# Patient Record
Sex: Male | Born: 1955
Health system: Southern US, Community
[De-identification: ages and names within clinical notes are randomized; demographics above are authoritative.]

## PROBLEM LIST (undated history)

## (undated) DIAGNOSIS — Z87442 Personal history of urinary calculi: Secondary | ICD-10-CM

## (undated) DIAGNOSIS — F17200 Nicotine dependence, unspecified, uncomplicated: Secondary | ICD-10-CM

## (undated) DIAGNOSIS — E785 Hyperlipidemia, unspecified: Secondary | ICD-10-CM

## (undated) DIAGNOSIS — N529 Male erectile dysfunction, unspecified: Secondary | ICD-10-CM

## (undated) DIAGNOSIS — I2510 Atherosclerotic heart disease of native coronary artery without angina pectoris: Secondary | ICD-10-CM

## (undated) DIAGNOSIS — I10 Essential (primary) hypertension: Secondary | ICD-10-CM

## (undated) DIAGNOSIS — R9389 Abnormal findings on diagnostic imaging of other specified body structures: Secondary | ICD-10-CM

## (undated) DIAGNOSIS — I219 Acute myocardial infarction, unspecified: Secondary | ICD-10-CM

## (undated) DIAGNOSIS — N200 Calculus of kidney: Secondary | ICD-10-CM

## (undated) DIAGNOSIS — T7840XA Allergy, unspecified, initial encounter: Secondary | ICD-10-CM

## (undated) DIAGNOSIS — E669 Obesity, unspecified: Secondary | ICD-10-CM

## (undated) HISTORY — DX: Male erectile dysfunction, unspecified: N52.9

## (undated) HISTORY — PX: CORONARY STENT PLACEMENT: SHX1402

## (undated) HISTORY — DX: Hyperlipidemia, unspecified: E78.5

## (undated) HISTORY — DX: Calculus of kidney: N20.0

## (undated) HISTORY — PX: KNEE SURGERY: SHX244

## (undated) HISTORY — DX: Essential (primary) hypertension: I10

## (undated) HISTORY — PX: OTHER SURGICAL HISTORY: SHX169

## (undated) HISTORY — DX: Obesity, unspecified: E66.9

## (undated) HISTORY — DX: Nicotine dependence, unspecified, uncomplicated: F17.200

## (undated) HISTORY — DX: Atherosclerotic heart disease of native coronary artery without angina pectoris: I25.10

## (undated) HISTORY — DX: Allergy, unspecified, initial encounter: T78.40XA

## (undated) HISTORY — PX: COLONOSCOPY: SHX174

---

## 1974-01-05 HISTORY — PX: APPENDECTOMY: SHX54

## 1997-08-12 ENCOUNTER — Emergency Department (HOSPITAL_COMMUNITY): Admission: EM | Admit: 1997-08-12 | Discharge: 1997-08-12 | Payer: Self-pay | Admitting: Emergency Medicine

## 1999-07-02 ENCOUNTER — Encounter: Payer: Self-pay | Admitting: *Deleted

## 1999-07-02 ENCOUNTER — Emergency Department (HOSPITAL_COMMUNITY): Admission: EM | Admit: 1999-07-02 | Discharge: 1999-07-02 | Payer: Self-pay | Admitting: *Deleted

## 1999-07-15 ENCOUNTER — Emergency Department (HOSPITAL_COMMUNITY): Admission: EM | Admit: 1999-07-15 | Discharge: 1999-07-15 | Payer: Self-pay | Admitting: Emergency Medicine

## 1999-08-19 ENCOUNTER — Emergency Department (HOSPITAL_COMMUNITY): Admission: EM | Admit: 1999-08-19 | Discharge: 1999-08-19 | Payer: Self-pay | Admitting: Emergency Medicine

## 1999-08-19 ENCOUNTER — Encounter: Payer: Self-pay | Admitting: Emergency Medicine

## 2002-10-22 ENCOUNTER — Inpatient Hospital Stay (HOSPITAL_COMMUNITY): Admission: EM | Admit: 2002-10-22 | Discharge: 2002-10-24 | Payer: Self-pay | Admitting: Emergency Medicine

## 2003-02-09 ENCOUNTER — Emergency Department (HOSPITAL_COMMUNITY): Admission: EM | Admit: 2003-02-09 | Discharge: 2003-02-09 | Payer: Self-pay | Admitting: Emergency Medicine

## 2003-09-17 ENCOUNTER — Ambulatory Visit: Payer: Self-pay | Admitting: Internal Medicine

## 2003-11-14 ENCOUNTER — Ambulatory Visit: Payer: Self-pay | Admitting: Internal Medicine

## 2003-11-15 ENCOUNTER — Ambulatory Visit: Payer: Self-pay | Admitting: *Deleted

## 2004-01-08 ENCOUNTER — Ambulatory Visit: Payer: Self-pay | Admitting: Cardiology

## 2004-01-21 ENCOUNTER — Ambulatory Visit: Payer: Self-pay

## 2004-01-21 ENCOUNTER — Encounter: Payer: Self-pay | Admitting: Cardiology

## 2004-02-19 ENCOUNTER — Ambulatory Visit: Payer: Self-pay | Admitting: Cardiology

## 2005-02-02 ENCOUNTER — Ambulatory Visit: Payer: Self-pay | Admitting: Pulmonary Disease

## 2005-03-02 ENCOUNTER — Ambulatory Visit: Payer: Self-pay | Admitting: Cardiology

## 2005-06-04 ENCOUNTER — Ambulatory Visit: Payer: Self-pay | Admitting: Cardiology

## 2005-10-06 ENCOUNTER — Ambulatory Visit: Payer: Self-pay | Admitting: Internal Medicine

## 2005-10-07 ENCOUNTER — Inpatient Hospital Stay (HOSPITAL_COMMUNITY): Admission: EM | Admit: 2005-10-07 | Discharge: 2005-10-07 | Payer: Self-pay | Admitting: Emergency Medicine

## 2005-10-12 ENCOUNTER — Ambulatory Visit: Payer: Self-pay | Admitting: Cardiovascular Disease

## 2006-10-12 ENCOUNTER — Ambulatory Visit: Payer: Self-pay | Admitting: Cardiovascular Disease

## 2007-04-11 ENCOUNTER — Ambulatory Visit: Payer: Self-pay | Admitting: Family Medicine

## 2007-05-04 ENCOUNTER — Ambulatory Visit: Payer: Self-pay | Admitting: Family Medicine

## 2007-05-17 ENCOUNTER — Ambulatory Visit: Payer: Self-pay | Admitting: Gastroenterology

## 2007-05-31 ENCOUNTER — Ambulatory Visit: Payer: Self-pay | Admitting: Family Medicine

## 2007-06-27 ENCOUNTER — Telehealth: Payer: Self-pay | Admitting: Gastroenterology

## 2007-06-27 ENCOUNTER — Encounter: Payer: Self-pay | Admitting: Gastroenterology

## 2007-07-25 ENCOUNTER — Encounter: Admission: RE | Admit: 2007-07-25 | Discharge: 2007-09-27 | Payer: Self-pay | Admitting: Family Medicine

## 2007-08-01 ENCOUNTER — Ambulatory Visit: Payer: Self-pay | Admitting: Family Medicine

## 2007-11-29 ENCOUNTER — Ambulatory Visit: Payer: Self-pay | Admitting: Family Medicine

## 2008-04-03 ENCOUNTER — Encounter: Payer: Self-pay | Admitting: Cardiovascular Disease

## 2008-04-03 ENCOUNTER — Ambulatory Visit: Payer: Self-pay | Admitting: Cardiovascular Disease

## 2008-04-03 DIAGNOSIS — E1169 Type 2 diabetes mellitus with other specified complication: Secondary | ICD-10-CM

## 2008-04-03 DIAGNOSIS — E1159 Type 2 diabetes mellitus with other circulatory complications: Secondary | ICD-10-CM | POA: Insufficient documentation

## 2008-04-03 DIAGNOSIS — E785 Hyperlipidemia, unspecified: Secondary | ICD-10-CM | POA: Insufficient documentation

## 2008-04-03 DIAGNOSIS — I10 Essential (primary) hypertension: Secondary | ICD-10-CM

## 2008-04-18 ENCOUNTER — Ambulatory Visit: Payer: Self-pay | Admitting: Cardiovascular Disease

## 2008-04-18 ENCOUNTER — Ambulatory Visit: Payer: Self-pay

## 2008-04-18 LAB — CONVERTED CEMR LAB
ALT: 21 units/L (ref 0–53)
Albumin: 3.6 g/dL (ref 3.5–5.2)
Alkaline Phosphatase: 103 units/L (ref 39–117)
Bilirubin, Direct: 0 mg/dL (ref 0.0–0.3)
CO2: 29 meq/L (ref 19–32)
Calcium: 8.4 mg/dL (ref 8.4–10.5)
Chloride: 104 meq/L (ref 96–112)
Creatinine, Ser: 0.9 mg/dL (ref 0.4–1.5)
Glucose, Bld: 158 mg/dL — ABNORMAL HIGH (ref 70–99)
HDL: 31.8 mg/dL — ABNORMAL LOW (ref >39.00)
Total CHOL/HDL Ratio: 5
Total Protein: 6.3 g/dL (ref 6.0–8.3)
Triglycerides: 131 mg/dL (ref 0.0–149.0)

## 2009-01-14 ENCOUNTER — Ambulatory Visit: Payer: Self-pay | Admitting: Family Medicine

## 2009-03-28 ENCOUNTER — Encounter: Payer: Self-pay | Admitting: Cardiovascular Disease

## 2009-04-05 ENCOUNTER — Encounter: Payer: Self-pay | Admitting: Cardiovascular Disease

## 2009-04-22 ENCOUNTER — Encounter: Payer: Self-pay | Admitting: Cardiovascular Disease

## 2010-01-16 ENCOUNTER — Ambulatory Visit
Admission: RE | Admit: 2010-01-16 | Discharge: 2010-01-16 | Payer: Self-pay | Source: Home / Self Care | Attending: Family Medicine | Admitting: Family Medicine

## 2010-02-04 NOTE — Miscellaneous (Signed)
Summary: refill  Clinical Lists Changes  Medications: Rx of TOPROL XL 25 MG XR24H-TAB (METOPROLOL SUCCINATE) 1 by mouth DAILY;  #90 x 3;  Signed;  Entered by: Julieta Gutting, RN, BSN;  Authorized by: Norva Karvonen, MD;  Method used: Electronically to [facility]*, [address] Shipping/mailing, [place], [place]  [address], Ph: [phone], Fax: [phone]    Prescriptions: TOPROL XL 25 MG XR24H-TAB (METOPROLOL SUCCINATE) 1 by mouth DAILY  #90 x 3   Entered by:   Julieta Gutting, RN, BSN   Authorized by:   Norva Karvonen, MD   Signed by:   Julieta Gutting, RN, BSN on 04/22/2009   Method used:   Electronically to        [facility]* (retail)       [address]       [address] Shipping/mailing       [place], [place]  [address]       Ph: [phone]       Fax: [phone]   RxID:   [id number]

## 2010-02-04 NOTE — Miscellaneous (Signed)
Summary: refill  Clinical Lists Changes  Medications: Changed medication from ENALAPRIL MALEATE 2.5 MG TABS (ENALAPRIL MALEATE) 1 by mouth DAILY to ENALAPRIL MALEATE 2.5 MG TABS (ENALAPRIL MALEATE) 1 by mouth DAILY - Signed Rx of ENALAPRIL MALEATE 2.5 MG TABS (ENALAPRIL MALEATE) 1 by mouth DAILY;  #90 x 3;  Signed;  Entered by: Julieta Gutting, RN, BSN;  Authorized by: Norva Karvonen, MD;  Method used: Electronically to [facility]*, [address], [address] Shipping/mailing, [place], [place]  [address], Ph: [phone], Fax: [phone]    Prescriptions: ENALAPRIL MALEATE 2.5 MG TABS (ENALAPRIL MALEATE) 1 by mouth DAILY  #90 x 3   Entered by:   Julieta Gutting, RN, BSN   Authorized by:   Norva Karvonen, MD   Signed by:   Julieta Gutting, RN, BSN on 03/28/2009   Method used:   Electronically to        [facility]* (retail)       [address]       [address] Shipping/mailing       [place], [place]  [address]       Ph: [phone]       Fax: [phone]   RxID:   [id number]

## 2010-02-04 NOTE — Miscellaneous (Signed)
Summary: Refill  Clinical Lists Changes  Medications: Changed medication from LIPITOR 20 MG TABS (ATORVASTATIN CALCIUM) Take one tablet by mouth daily. to LIPITOR 20 MG TABS (ATORVASTATIN CALCIUM) Take one tablet by mouth daily. - Signed Rx of LIPITOR 20 MG TABS (ATORVASTATIN CALCIUM) Take one tablet by mouth daily.;  #90 x 3;  Signed;  Entered by: Julieta Gutting, RN, BSN;  Authorized by: Norva Karvonen, MD;  Method used: Electronically to [facility]*, [address], [address] Shipping/mailing, [place], [place]  [address], Ph: [phone], Fax: [phone]    Prescriptions: LIPITOR 20 MG TABS (ATORVASTATIN CALCIUM) Take one tablet by mouth daily.  #90 x 3   Entered by:   Julieta Gutting, RN, BSN   Authorized by:   Norva Karvonen, MD   Signed by:   Julieta Gutting, RN, BSN on 04/05/2009   Method used:   Electronically to        [facility]* (retail)       [address]       [address] Shipping/mailing       [place], [place]  [address]       Ph: [phone]       Fax: [phone]   RxID:   [id number]

## 2010-02-14 ENCOUNTER — Other Ambulatory Visit: Payer: Self-pay | Admitting: Gastroenterology

## 2010-02-14 ENCOUNTER — Ambulatory Visit (HOSPITAL_COMMUNITY)
Admission: RE | Admit: 2010-02-14 | Discharge: 2010-02-14 | Disposition: A | Discharge status: Home / Self Care | Payer: 59 | Source: Ambulatory Visit | Attending: Gastroenterology | Admitting: Gastroenterology

## 2010-02-14 DIAGNOSIS — K648 Other hemorrhoids: Secondary | ICD-10-CM | POA: Insufficient documentation

## 2010-02-14 DIAGNOSIS — I2510 Atherosclerotic heart disease of native coronary artery without angina pectoris: Secondary | ICD-10-CM | POA: Insufficient documentation

## 2010-02-14 DIAGNOSIS — E119 Type 2 diabetes mellitus without complications: Secondary | ICD-10-CM | POA: Insufficient documentation

## 2010-02-14 DIAGNOSIS — K644 Residual hemorrhoidal skin tags: Secondary | ICD-10-CM | POA: Insufficient documentation

## 2010-02-14 DIAGNOSIS — D126 Benign neoplasm of colon, unspecified: Secondary | ICD-10-CM | POA: Insufficient documentation

## 2010-02-14 DIAGNOSIS — Z1211 Encounter for screening for malignant neoplasm of colon: Secondary | ICD-10-CM | POA: Insufficient documentation

## 2010-02-14 LAB — GLUCOSE, CAPILLARY: Glucose-Capillary: 131 mg/dL — ABNORMAL HIGH (ref 70–99)

## 2010-04-28 ENCOUNTER — Other Ambulatory Visit: Payer: Self-pay | Admitting: Cardiovascular Disease

## 2010-05-20 ENCOUNTER — Other Ambulatory Visit: Payer: Self-pay | Admitting: Cardiovascular Disease

## 2010-05-20 NOTE — Assessment & Plan Note (Signed)
[facility]                            CARDIOLOGY OFFICE NOTE   VICTORIOUS, KUNDINGER                    MRN:          [id number]  DATE:10/12/2006                            DOB:          [birth year]    Jerome Rodriguez returns for followup at the [facility] Cardiology Office on  October 12, 2006.  He is a very nice 55 year old gentleman with coronary  artery disease.  He initially presented with a non ST elevation MI back  in 2004 and had TAXUS stents placed in the mid and distal LAD.  He has  done well since his initial event and has had no further episodes of  acute coronary syndrome.  He did present in October of 2007 with  atypical chest pain and underwent an exercise treadmill stress test.  He  was able to exercise for approximately 7 minutes, achieving a workload  of 8 METS and he had a negative clinical and electrocardiographic study  at that time.   He denies any chest pain, dyspnea, orthopnea, PND or edema.  He  complains of some blurry vision but this only occurs when reading small  print.  He has not had any postural symptoms.  Jerome Rodriguez continues to  smoke cigars but he is trying to cut back.  He has not been engaged in  any regular exercise.  He continue to work very long hours with his [employer]  and [employer].   CURRENT MEDICATIONS:  1. Enalapril 2.5 mg daily.  2. Zocor 40 mg daily.  3. Aspirin 325 mg daily.  4. Toprol XL 25 mg daily.   ALLERGIES:  NKDA.   PHYSICAL EXAMINATION:  Weight is 255, blood pressure is 100/66 in the  right arm, 100/60 in the left arm, heart rate is 85, respiratory rate is  16.  HEENT:  Normal.  NECK:  Normal carotid upstrokes without bruits, jugular venous pressure  is normal.  LUNGS:  Clear to auscultation bilaterally.  HEART:  Regular rate and rhythm without murmurs or gallops.  ABDOMEN:  Soft, obese, nontender, no organomegaly.  No abdominal bruits.  EXTREMITIES:  No clubbing, cyanosis or edema.   Peripheral pulses are 2+  and equal throughout.   EKG is normal sinus rhythm and is within normal limits.   ASSESSMENT:  1. Coronary artery disease.  He is asymptomatic.  He needs aggressive      secondary risk reduction.  I have strongly encouraged him to      discontinue tobacco as well as initiate an exercise program aimed      at significant weight loss.  With his asymptomatic status he does      not require any further ischemic testing at this point.  He should      remain on aspirin, statin and anti-hypertensive therapy as outlined      below.  2. Dyslipidemia.  He is not tolerating Zocor well.  He has had trouble      with lower back myalgias in the past and he has restarted Zocor but      has developed recurrent pain in his low back.  He has been on      Lipitor in the past and has apparently tolerated that well.  I have      asked him to change from Zocor 40 mg daily to Lipitor 20 mg daily.      A prescription was written and we will check baseline lipids and      liver function tests.  We will plan on repeating lipids and liver      function tests in 12 weeks to see how his clinical response has      been.  3. Hypertension.  His blood pressure is under ideal control at this      point.  Continue with long acting metoprolol as well as low-dose      Enalapril.   For followup I would like to see Jerome Rodriguez on a yearly basis.  We  will follow up with him after his baseline laboratory data has been  completed.  It has been come time since he has had his yearly labs and  we will schedule him for a CBC, complete metabolic panel and lipid panel  to be checked in the next few weeks.     Veverly Fells. Excell Seltzer, MD  Electronically Signed    MDC/MedQ  DD: 10/12/2006  DT: 10/12/2006  Job #: [id number]

## 2010-05-20 NOTE — Telephone Encounter (Signed)
Can you verify this patient?

## 2010-05-20 NOTE — Telephone Encounter (Signed)
[place] pt 

## 2010-05-21 ENCOUNTER — Telehealth: Payer: Self-pay | Admitting: Cardiovascular Disease

## 2010-05-21 MED ORDER — ENALAPRIL MALEATE 2.5 MG PO TABS: 2.5000 mg | ORAL_TABLET | Freq: Every day | ORAL | Status: DC

## 2010-05-21 NOTE — Telephone Encounter (Signed)
Pt called and said phar had faxed for refill 10 days ago and not received yet by phar.  [facility] OP phar. He is out of medication.  Enalapril

## 2010-05-23 NOTE — H&P (Signed)
Jerome Rodriguez, Jerome Rodriguez           ACCOUNT NO.:  [id number]   MEDICAL RECORD NO.:  [id number]          PATIENT TYPE:  INP   LOCATION:  2015                         FACILITY:  [facility]   PHYSICIAN:  Darryl D. Prime, MD    DATE OF BIRTH:  05-25-1955   DATE OF ADMISSION:  10/06/2005  DATE OF DISCHARGE:                                HISTORY & PHYSICAL   CHIEF COMPLAINT:  Chest pain.   CARDIAC ASSESSMENT:  Salvadore Farber, MD   TOTAL VISIT TIME:  Approximately 58 minutes.   HISTORY OF PRESENT ILLNESS:  Jerome Rodriguez is a 55 year old male  with a history of coronary artery disease status post non-ST elevation MI in  October 2004 and had a left heart catheterization, coronary angiogram at the  time showing LAD 99% lesion after D2, right coronary artery was dominant 50%  and sequential lesions, 25% and then 25%.  Left circumflex was okay and left  main was also okay.  Left ventricular ejection fraction 60%.  He was status  post Taxus stent at that time to the LAD, 99% mid lesion to 0%. He has  history of dyslipidemia, history of tobacco abuse who presents with chest  pain.  The patient notes intermittent mild chest pain over the last two  weeks associated with belching and also associated with mild shortness of  breath and nausea.  It was substernal pressure, squeezing sensation with  radiation to the left shoulder and under the left arm to the elbow.  The  radiation was described as a sharp lancing pain.  The patient notes,  however, the night prior to admission October 06, 2005 sudden onset at rest  of 9/10 severe substernal chest pain at 9:15 p.m. with radiation to the arm  described as severe pressure pain sensation substernal associated with  shortness of breath and burping.  The patient took an aspirin 325 mg  approximately 9:30 p.m. October 06, 2005.  He spoke to Dr. Samule Ohm on the  phone and the patient's wife drove him to the emergency room.  The patient  notes on getting to  the emergency room approximately 11:30 or 11:40 p.m. he  was chest pain free.  He notes he was not given anything in the emergency  room.  The patient was chest pain free at time of interview.   PAST MEDICAL HISTORY/PAST SURGICAL HISTORY:  As above.  He is also status  post appendectomy, right shoulder surgery and arthroscopic surgery of the  knee.   ALLERGIES:  NO KNOWN DRUG ALLERGIES.   MEDICATIONS:  1. Enalapril 2.5 mg by mouth daily.  2. Aspirin 325 mg by mouth daily.  3. Metoprolol in the form of Toprol XL 25 mg by mouth daily.  4. Zocor 40 mg by mouth at bedtime daily.  5. The patient took Plavix for six months post-stent.   The patient notes he had a stress test done in February 2007 which was  unremarkable. This was an exercise stress test.   SOCIAL HISTORY:  Rare alcohol.  He did smoke for 25 years 1 pack a day,  recently discontinued six months  ago, he is now smoking cigars occasionally.  Denies any illicit drug use.   FAMILY HISTORY:  He notes his sister has a possible hole in the heart.   REVIEW OF SYSTEMS:  CONSTITUTIONALLY:  He denies any fevers or weight  changes.  He does note significant fatigue recently that is new especially  over the last three days.  EYES:  He denies any decreased visual acuity or  drainage from the eye.  EAR, NOSE MOUTH AND THROAT:  No throat swelling,  throat pain.  CARDIOVASCULAR:  As noted above.  RESPIRATORY:  He denies any  wheezing or cough.  GASTROINTESTINAL:  He notes nausea, no vomiting.  GENITOURINARY:  He denies any dysuria, hematuria, musculoskeletal.  No  effusions or joint pain.  INTEGUMENTARY:  He denies any nail changes or  rashes.  NEUROLOGIC:  He denies any focal weakness or seizure history.  PSYCHIATRIC:  He denies any depression, suicidal ideation.  ENDOCRINE:  He  denies any polydipsia, polyuria. HEMATOLOGIC/LYMPHATIC:  He denies easy  bruising or bleeding. ALLERGIES/IMMUNOLOGIC:  He denies any allergies to  aspirin or  any allergic rhinitis.   PHYSICAL EXAMINATION:  VITAL SIGNS:  Temperature 97.6, blood pressure  108/69, respiratory rate 14, heart rate 90, oxygen saturations 92% on room  air.  GENERAL:  He is a well-developed male in no acute distress.  EYES:  Pupils equal, round and reactive to light.  Conjunctivae are not  pale.  EARS, NOSE, MOUTH AND THROAT:  Overall appearance of the ears and nose shows  no scars or lesions.  Inspection of the nasal mucosa or septum, turbinates,  inspection of the teeth, gums lips, salivary glands, hard and soft palate  reveals no lesions.  NECK:  Symmetric, trachea midline, no crepitus.  RESPIRATORY:  Clear to auscultation bilaterally anteriorly and posteriorly.  CARDIOVASCULAR:  No displacement of his point of maximal impulse.  No right  or left ventricular heaves.  Normal S1, S2.  Regular rate and rhythm, no S3  or S4.  No increased jugular venous distention, no carotid bruits. Dorsalis  pedis and posterior tibial and radial arteries are 2+ and symmetric.  GASTROINTESTINAL:  The abdomen is soft, nontender, nondistended with no  hepatosplenomegaly, normal active bowel sounds.  LYMPHATIC:  Exam of the neck, axilla and groin reveals no lesions or  lymphadenopathy.  MUSCULOSKELETAL:  No joint effusions, he moves all extremities well.  SKIN:  Exam shows no rashes or ulcers.  NEUROLOGIC:  Cranial nerves 2 through 12 are grossly intact.  Alert and  oriented x 4.  Strength and sensation is full.   LABORATORY DATA:  Sodium 140, potassium 3.6, chloride 106, bicarb 26, BUN  16, creatinine 1.1, glucose 175.  Total bilirubin 0.7, alkaline phosphatase  106, AST 22, ALT 21, total protein 5.9, albumin 3.4.  White count 7300,  hemoglobin 14.4, hematocrit 42.1, platelet count 253,000, segs 56,  lymphocytes 32, calcium 8.7.  Cardiac markers performed 2358 showed a CK MB 1.3, troponin less than 0.05, myoglobin 73.3. Similar markers results were  seen.  He also had cardiac  markers drawn 2305 with similar results.  PTT 29,  PT 14.5, INR 1.1, D-dimer 0.25.  EKG October 06, 2005 shows a normal sinus  rhythm at 92, normal axis, normal intervals, EKG done October 24, 2002  showed normal sinus rhythm, 72, with normal axis, normal intervals.   ASSESSMENT AND PLAN:  This is a gentleman with history of coronary artery  disease now with chest pain rule out  PTE.  He likely has unstable angina.  Will admit to telemetry.  Patient will be placed on q.6 metoprolol 25 mg,  oxygen.  He will be held n.p.o.  Lovenox will be give 1 mg per kg every 12  hours.  Continue statin.  Will increase __________  to 80 mg of Zocor daily.  We will check a lipid panel.  Protonix will be given for GI prophylaxis.  I  will give potassium for his hypokalemia.  I will guaiac stools to rule out  any source of bleeding.  Will get a chest x-ray to rule out any pulmonary  edema or interval cardiomegaly.  He will be given oxygen.  Oxygen  saturations are on the lower side.  Will continue Enalapril.  He will be  held n.p.o. pending further evaluation by Dr. Ladona Ridgel.      Darryl D. Prime, MD  Electronically Signed     DDP/MEDQ  D:  10/07/2005  T:  10/08/2005  Job:  [id number]

## 2010-05-23 NOTE — Discharge Summary (Signed)
Jerome Rodriguez, Jerome Rodriguez           ACCOUNT NO.:  [id number]   MEDICAL RECORD NO.:  [id number]          PATIENT TYPE:  INP   LOCATION:  2015                         FACILITY:  [facility]   PHYSICIAN:  Salvadore Farber, MD  DATE OF BIRTH:  [birth year]   DATE OF ADMISSION:  10/06/2005  DATE OF DISCHARGE:  10/07/2005                                 DISCHARGE SUMMARY   PRINCIPAL DIAGNOSIS:  Chest pain.   SECONDARY DIAGNOSES:  1. Coronary artery disease status post left anterior descending stenting      with Taxus drug-eluting stent in October 2004.  2. Hyperlipidemia.  3. Tobacco abuse.  4. History of right shoulder surgery.  5. Status post appendectomy.  6. History of arthroscopic knee surgery.   ALLERGIES:  No known drug allergies.   HISTORY OF PRESENT ILLNESS:  A 55 year old male with prior history of CAD  status post non-ST-elevation MI in October 2004 with catheterization  revealing 99% stenosis in the LAD which was successfully treated with a  Taxus drug-eluting stent.  He most recently had a negative stress test in  February 2007.  He was in his usual state of health until approximately 2  weeks ago when he began to experience intermittent chest discomfort  associated with belching, nausea and mild shortness of breath.  Discomfort  was substernal in nature, but sometimes radiates to the left shoulder and  left arm.  On October 06, 2005, he had more severe, 9/10, discomfort in his  chest associated with belching, and his wife took him into the ED for  additional evaluation.  His cardiac markers were normal.  EKG was without  any acute changes and he was admitted for additional rule out.   HOSPITAL COURSE:  The patient ruled out for MI by cardiac markers x3.  His  ECG has remained stable and he has not any additional chest pain.  His  symptoms are suspiciously GERD-like and for that reason we have added PTI  therapy.  We counseled him on the importance of smoking cessation and  also  have provided him with prescription for Chantex.  We will discharge him  today in satisfactory condition with the plan for him to undergo an exercise  treadmill test on Monday October 8 at 10:45 a.m.  Secondary to concern for  sleep apnea, we will also arrange for pulmonary evaluation by Dr. Maple Hudson of  [facility] pulmonology on October 19, 2005, at 3:20 p.m..  The patient likely  will require a sleep study.   DISCHARGE LABORATORY DATA:  Hemoglobin 14.3, hematocrit 41.2, WBC 6.5,  platelets 223,000.  Sodium 140, potassium 3.6, chloride 106, CO2 26, BUN 16,  creatinine 1.1, glucose 175, total bilirubin 0.7, alkaline phosphatase 106,  AST 22, ALT 21, total protein 5.9, albumin 3.4, calcium 8.7, CK 151, MB 1.7,  troponin-I of 1.1, total cholesterol 97, triglycerides 214, HDL 43, LDL 121.   DISPOSITION:  The patient is being discharged home today in good condition.   FOLLOW-UP APPOINTMENTS:  He will undergo exercise treadmill testing at  [facility] on October 12, 2005, at 9:45 a.m. He will then follow  up  with Dr. Maple Hudson at [facility] on October 19, 2005 at 3:20 p.m.  He  finally has a followup appointment with Dr. Randa Evens on November 11, 2005, at 2:53.   DISCHARGE MEDICATIONS:  1. Aspirin 325 mg every day.  2. Toprol XL 25 mg every day.  3. Enalapril 2. 5 mg every day.  4. Simvastatin 40 mg every day.  5. Chantex as prescribed.  6. Nitroglycerin 0.4 mg sublingual p.r.n. chest pain.  7. Protonix 4 mg every day.   DURATION OF DISCHARGE ENCOUNTER:  45 minutes including dictation time.     ______________________________  Nicolasa Ducking, ANP      Salvadore Farber, MD  Electronically Signed    CB/MEDQ  D:  10/07/2005  T:  10/08/2005  Job:  [id number]   cc:   Joni Fears D. Maple Hudson, MD, FCCP, FACP

## 2010-05-23 NOTE — Cardiovascular Report (Signed)
NAME:  AL FOYE, DAMRON                    ACCOUNT NO.:  [id number]   MEDICAL RECORD NO.:  [id number]                   PATIENT TYPE:  INP   LOCATION:  2908                                 FACILITY:  [facility]   PHYSICIAN:  Veneda Melter, M.D.                   DATE OF BIRTH:  September 05, 1955   DATE OF PROCEDURE:  10/23/2002  DATE OF DISCHARGE:                              CARDIAC CATHETERIZATION   PROCEDURES PERFORMED:  1. PTCA and stent placement mid left anterior descending.  2. PTCA and stent placement distal left anterior descending.  3. PTCA and stent placement of the second diagonal branch of the left     anterior descending.  4. Perclose right femoral artery.   DIAGNOSES:  1. Severe single vessel coronary artery disease.  2. Unstable angina.   HISTORY:  Mr. Roderic Palau is a 55 year old male who presents with unstable  angina.  The patient was admitted to the hospital, stabilized medically, and  underwent cardiac catheterization by Rollene Rotunda, M.D. showing severe  single vessel coronary artery disease involving the LAD with well preserved  LV function.  He is referred for percutaneous intervention.   TECHNIQUE:  Informed consent was obtained.  An existing 6-French sheath in  the right groin was exchanged for a 7-French sheath.  The patient was then  given a total of 600 mg Plavix orally as well as heparin and Integrilin on a  weight adjusted basis to maintain ACT approximately 300 seconds.  A 7-French  Voda left 4 guide catheter was used to engage the left coronary artery.  A  0.014 inch Luge wire advanced in the distal LAD.  A 3.0 x 12 mm Voyager  balloon was then introduced and used to pre dilate the mid LAD lesion at 8  atmospheres for 45 seconds.  Repeat angiography showed recannulization of  the vessel with significant increase in lumen diameter and distal vessel  flow from TIMI grade 2-3.  A 3.5 x 16 mm Taxus Express-2 stent was  introduced, carefully positioned in the  mid LAD just distal to the first  diagonal branch and straddling the second diagonal branch and deployed at 12  atmospheres for 60 seconds.  A 3.5 x 12 mm Quantum Maverick balloon was then  used to post dilate the stent.  Two inflations were performed at 16  atmospheres for 30 seconds and a 4.0 x 8 mm Quantum Maverick balloon was  used to further dilate the proximal segment of the stent at 16 atmospheres  for 30 seconds.  Repeat angiography showed an excellent result with no  residual stenosis, full coverage of the lesion, and TIMI 3 flow in the LAD.  There was, unfortunately, mild compromise of the second diagonal branch at  its origin with plaque shift resulting in 70% narrowing.  There was also  further narrowing of at least 70% in the distal LAD that was under  appreciated prior  to intervention of the mid LAD due to incomplete distal  flow.  A 3.0 x 12 mm Taxus Express-2 stent was then introduced, positioned  at the distal LAD at the severe narrowing of 70% and deployed at 10  atmospheres for 30 seconds.  A 3.0 x 8 mm Quantum Maverick balloon was then  used to post dilate the distal and proximal segments of the stent.  Two  inflations were performed at 16 atmospheres for 30 seconds and a single  inflation in the mid section at 18 atmospheres for 60 seconds.  Repeat  angiography showed an excellent result with less than 10% residual narrowing  in the mid section in the area of severe stenosis but there was full  coverage of the lesion effectively improved vessel diameter.  The Luge wire  was then repositioned in the second diagonal branch and a 2.5 x 9 mm  Maverick balloon used to dilate the ostium of the diagonal branch for  treatment of plaque shift and stent jail at 6 atmospheres for 30 seconds.  Repeat angiography was then performed showing excellent result with no  residual stenosis and improved flow through the second diagonal branch.  There was no evidence of compromise of the  native LAD.  Final angiography  was performed in various projections showing an excellent result with no  residual stenosis, no vessel damage, and TIMI 3 flow through the LAD.  The  guide catheter was then removed as was the sheath and a Perclose suture  closure device deployed to the right femoral artery.  Adequate hemostasis  was achieved.  The patient transferred to the holding area in stable  condition.  He tolerated procedure well.   FINAL RESULT:  1. Successful PTCA and stent placement to the mid left anterior descending     with reduction of 99% narrowing to 0% with placement of a 3.5 x 16 mm     Taxus Express-2 stent.  2. Successful PTCA and stent placement distal left anterior descending with     reduction of 70% narrowing to less than 10% with placement of a 3.0 x 12     mm Taxus Express-2 stent.                                               Veneda Melter, M.D.    Melton Alar  D:  10/23/2002  T:  10/24/2002  Job:  [id number]   cc:   Carole Binning, M.D. [facility]   Rollene Rotunda, M.D.

## 2010-05-23 NOTE — Procedures (Signed)
[facility]                                EXERCISE ISIAHA, GREENUP                    MRN:          [id number]  DATE:10/12/2005                            DOB:          1955-05-02    CARDIOLOGIST:  Dr. Randa Evens   HISTORY:  [name] is a very pleasant 54 year old male patient followed  by Dr. Samule Ohm with a history of non-ST-elevation myocardial infarction  October 2004 treated with a Taxus drug-eluting stent to the LAD.  He  recently presented to the emergency room at [facility] with  complaints of chest discomfort associated with belching, nausea and mild  shortness of breath.  He ruled out for myocardial infarction and was set up  for an outpatient exercise treadmill test today.   EXERCISE TREADMILL TEST:  The patient exercised for 6 minutes 47 seconds,  achieving a work level of 8.2 METs.  His resting heart rate rose from 91  beats per minute to a maximum of 150 beats per minute.  This value  represented 88% of his maximal age-predicted heart rate.  His resting blood  pressure went from 108/60 to a maximum of 174/70.  The test was stopped  secondary to fatigue and dyspnea.  He denied any chest pain.   Electrocardiogram at baseline revealed sinus rhythm with a heart rate of 96,  normal axis.  During exercise he had no ST-T wave changes to suggest  ischemia or injury.  At maximal exercise he did have upsloping ST depression  in leads V3 through V6.  There were nonspecific changes inferiorly.   IMPRESSION:  Clinically negative and electrically negative exercise  treadmill test.   RECOMMENDATIONS:  The patient is to follow up with Dr. Samule Ohm.  The strips  from his exercise treadmill test will be left for further review by Dr.  Samule Ohm.      ______________________________  Tereso Newcomer, PA-C    ______________________________  Veverly Fells. Excell Seltzer, MD     SW/MedQ  DD:  10/12/2005  DT:  10/12/2005  Job #:  [id number]

## 2010-05-27 NOTE — H&P (Signed)
NAME:  Jerome Rodriguez, Jerome Rodriguez                    ACCOUNT NO.:  [id number]   MEDICAL RECORD NO.:  [id number]                   PATIENT TYPE:  EMS   LOCATION:  [facility]                                 FACILITY:  [facility]   PHYSICIAN:  Anna Genre. Maisie Fus, M.D. [facility]           DATE OF BIRTH:  1955/01/12   DATE OF ADMISSION:  10/22/2002  DATE OF DISCHARGE:                                HISTORY & PHYSICAL   The patient has no assigned cardiologist or primary care Tinleigh Whitmire.   CHIEF COMPLAINT:  Chest pain/angina.   HISTORY OF PRESENT ILLNESS:  This is a 55 year old gentleman who presents  with unstable angina has had chest pain times approximately 10 days with  many typical features.  Over this course in time the patient has noted  increasing frequency of the chest pain.  Risk factors include positive  family history and tobacco use.  The patient denies any syncope, presyncope,  orthopnea or paroxysmal nocturnal dyspnea.  Has not had any prior cardiac  work up.  Lipid status at this point is unknown.   PAST MEDICAL HISTORY:  The patient has not had any prior catheterizations or  prior bypass surgery.  There is no assessment of his left ventricular  function.  The patient reports never being hospitalized and no prior history  of significant medical illness.   MEDICATIONS:  Aspirin which he started taking approximately three weeks ago,  however, discontinued it about 10 days ago secondary to what he thought was  constipation induced by the aspirin.   SOCIAL HISTORY:  The patient lives in [place] alone, is single, works at  [employer] currently.  The patient does report being divorced, has two  children, one 22 and one 46.  The patient has a 35 pack year history of  tobacco, now currently smokes cigars after stopping cigarettes in June of  this year.  Alcohol - the patient reports two beers per day.  No herbal use  or illicit drug use.  The patient exercises occasionally.   FAMILY HISTORY:   Significant for questionable cardiomyopathy in his mother.  A sister had four-vessel bypass surgery at the age of 9 in [place].   REVIEW OF SYSTEMS:  CONSTITUTIONAL:  Significant for fatigue, otherwise no  other constitutional symptoms.  HEENT, skin review of systems are within  normal limits.  CARDIOPULMONARY:  Please see  HPI.  GU/GI:  No significant abnormalities.  NEURO/PSYCHE:  Was not  assessed.  MUSCULOSKELETAL:  Negative.  All other review of systems were  negative.  The patient is a full code.   PHYSICAL EXAMINATION:  VITAL SIGNS:  Temperature is afebrile, pulse is 79,  respiratory rate 12, blood pressure 89/60.  Saturating 98% on room air.  GENERAL:  This is a well appearing, comfortable middle [place] male in no  apparent distress.  HEENT:  Normocephalic, atraumatic.  Extraocular muscles are intact.  Sclerae  were anicteric.  NECK:  No lymphadenopathy, there were  no bruits and JVP was not elevated.  CARDIOVASCULAR:  Regular rate and rhythm, normal S1, S2.  No appreciable  gallops, S3 or S4.  Pulses were 2+ and symmetric bilaterally in the carotid  distribution.  LUNGS:  Clear to auscultation bilaterally.  SKIN:  Within normal limits.  ABDOMEN:  Soft, nontender, nondistended with normal active bowel sounds.  CHEST WALL:  The patient did have chest wall tenderness in the left upper  sternal border.  GU EXAM:  Within normal limits.  RECTAL:  Exam was not performed.  EXTREMITIES:  No clubbing, cyanosis or edema.  MUSCULOSKELETAL:  Exam showed no joint deformities.  NEUROLOGIC:  He was alert and oriented x3.  Cranial nerves II-XII grossly  intact, 5 out of 5 strength throughout.  Reflexes were not tested.   EKG showed a rate of 80, normal sinus rhythm, axis was normal and intervals  were within normal limits, no Q or ST changes and no hypertrophy.   NOTABLE LABS:  Hematocrit 42, platelet count 257,000.  Potassium 4.3, bicarb  30, glucose 122.  GI panel within normal limits.   CK-MB 231.  The patient's  cardiac markers are still pending.  CK is back at 231 currently.  Will await  the remainder of the enzymes to effectively rule the patient out or in.   IMPRESSION AND PLAN:  A patient with a good story for unstable angina  increasing over the past 10 days with mild to moderate risk factors.  Given  the patient's convincing story I feel that it is reasonable to proceed with  cardiac catheterization for the patient for definitive risk stratification.  For now will treat patient with enoxaparin, hold on clopidogrel or IIb-IIIa  until further information is obtained about patient's cardiac markers.  There are no EKG changes.  The patient also will be treated with a small  dose of beta blocker and ACE inhibitor for its benefits in patients with  cardiac disease.  Secondary prevention as well as possible primary  prevention based on what the patient's anatomy will be of utmost importance.  The patient will be started on atorvastatin and lipids will be assessed.                                                Anna Genre Maisie Fus, M.D. [facility]    KLT/MEDQ  D:  10/22/2002  T:  10/22/2002  Job:  [id number]

## 2010-05-27 NOTE — Discharge Summary (Signed)
   NAME:  Jerome Rodriguez, Jerome Rodriguez                    ACCOUNT NO.:  [id number]   MEDICAL RECORD NO.:  [id number]                   PATIENT TYPE:  INP   LOCATION:  2908                                 FACILITY:  [facility]   PHYSICIAN:  Carole Binning, M.D. [facility]         DATE OF BIRTH:  [birth year]   DATE OF ADMISSION:  10/22/2002  DATE OF DISCHARGE:                           DISCHARGE SUMMARY - REFERRING   DISCHARGE DIAGNOSES:  1. Non-Q-wave myocardial infarction.  2. Hyperlipidemia.  3. Tobacco abuse.   HOSPITAL COURSE:  [name] is a 55 year old male patient who presented  to [facility] with unstable angina.  He actually ruled in for an non-ST-  elevated myocardial infarction with elevated troponins.  Ultimately, he did  require cardiac catheterization which revealed an LAD with a mid 99% lesion  after the D-2, RCA dominant with a 50% followed by a proximal 25% and distal  25% lesion, circumflex was large with no angiographic evidence of coronary  artery disease, as well as the left main.  Left ventriculogram was normal  with an EF of 60%.  At this point Dr. Veneda Melter performed an  angioplasty/Taxus stent placement to the 99% mid LAD lesion, reducing it to  a 0% post procedure.  At this point the patient will need to remain on  Plavix for greater than or equal to six months.   The patient's laboratory studies on discharge include a negative cardiac  isoenzymes.  Sodium 139, potassium 3.8, BUN 11, creatinine 1.1.  Hemoglobin  14.1, hematocrit 41.3, platelets 230.  He is being discharged to home in  stable condition on the following medications:  1. Lipitor 80 mg one p.o. q.h.s.  2. Enteric-coated aspirin 325 mg a day.  3. Plavix 75 mg a day.  4. Sublingual nitroglycerin p.r.n. pain.  5. Vasotec 5 mg a day.  6. Metoprolol 50 mg one-half tablet daily.   No strenuous activity.  If he has any pain in his groin site he is to take  Tylenol.  He is not to smoke.  No driving or  strenuous activity for two  days.  Gradually increase activity but do not return to work until November 06, 2002.  He has a follow-up appointment with Dr. Gerri Spore on November 02, 2002 at 2 p.m. and call for any questions or concerns.  Clean over  catheterization site with soap and water and no smoking.      Guy Franco, P.A. [facility]                      Carole Binning, M.D. [facility]    LB/MEDQ  D:  10/24/2002  T:  10/24/2002  Job:  [id number]

## 2010-05-27 NOTE — Cardiovascular Report (Signed)
   NAME:  Jerome Rodriguez, Jerome Rodriguez.                   ACCOUNT NO.:  [id number]   MEDICAL RECORD NO.:  [id number]                   PATIENT TYPE:  INP   LOCATION:                                       FACILITY:  [facility]   PHYSICIAN:  Rollene Rotunda, M.D.                DATE OF BIRTH:  Dec 04, 1955   DATE OF PROCEDURE:  DATE OF DISCHARGE:                              CARDIAC CATHETERIZATION   PRIMARY CARE PHYSICIAN:  None.   PROCEDURES PERFORMED:  1. Left heart catheterization.  2. Coronary arteriography.   CARDIOLOGIST:  Rollene Rotunda, M.D.   INDICATIONS:  Evaluate patient with non-Q wave myocardial infarction and  unstable angina.   PROCEDURAL NOTE:  Left heart catheterization was performed via the right  femoral artery.  The artery was cannulated using an anterior wall puncture.  A number 6 family history arterial sheath was inserted via the modified  Seldinger technique.  Preformed Judkins and a pigtail catheter were  utilized.   The patient tolerated the procedure well and left the lab in stable  condition.   RESULTS:   HEMODYNAMIC DATA:  LV 103/20.  Aortic output 103/84.   ANGIOGRAPHIC DATA:  Coronaries:  The left main was normal.   The LAD had diffuse luminal irregularities.  There was mid 99% stenosis  after the second diagonal.  The first diagonal was a large vessel and  normal.  The second diagonal was small and normal.  The third diagonal was  small and normal.   The circumflex was large with diffuse luminal irregularities.  There was a  large mid obtuse marginal, which was normal.  There was a very large  posterolateral, which had luminal irregularities.   The right coronary artery was a dominant vessel.  There was a shelf-like 50%  stenosis.  There was a long proximal 25% stenosis and a long distal 25%  stenosis.   VENTRICULOGRAPHIC DATA:  Left Ventriculogram:  The left ventriculogram was  obtained in the RAO projection with an EF of 65%.    CONCLUSION:  1.  High-grade left anterior descending stenosis.  2. Moderate right coronary artery obstruction and diffuse irregularities     elsewhere.   PLAN:  The patient will have percutaneous revascularization of the LAD and  aggressive secondary risk reduction.                                                 Rollene Rotunda, M.D.    JH/MEDQ  D:  10/23/2002  T:  10/24/2002  Job:  [id number]

## 2010-06-24 ENCOUNTER — Encounter: Payer: Self-pay | Admitting: Family Medicine

## 2010-06-24 DIAGNOSIS — N529 Male erectile dysfunction, unspecified: Secondary | ICD-10-CM

## 2010-06-24 DIAGNOSIS — E118 Type 2 diabetes mellitus with unspecified complications: Secondary | ICD-10-CM | POA: Insufficient documentation

## 2010-06-24 DIAGNOSIS — F17200 Nicotine dependence, unspecified, uncomplicated: Secondary | ICD-10-CM

## 2010-06-24 DIAGNOSIS — I2510 Atherosclerotic heart disease of native coronary artery without angina pectoris: Secondary | ICD-10-CM | POA: Insufficient documentation

## 2010-06-26 ENCOUNTER — Encounter: Payer: Self-pay | Admitting: Cardiovascular Disease

## 2010-07-15 ENCOUNTER — Encounter: Payer: Self-pay | Admitting: Family Medicine

## 2010-07-17 ENCOUNTER — Encounter: Payer: Self-pay | Admitting: Family Medicine

## 2010-07-17 ENCOUNTER — Ambulatory Visit: Payer: Self-pay | Admitting: Family Medicine

## 2010-07-17 ENCOUNTER — Ambulatory Visit (INDEPENDENT_AMBULATORY_CARE_PROVIDER_SITE_OTHER): Payer: 59 | Admitting: Family Medicine

## 2010-07-17 DIAGNOSIS — Z79899 Other long term (current) drug therapy: Secondary | ICD-10-CM

## 2010-07-17 DIAGNOSIS — E785 Hyperlipidemia, unspecified: Secondary | ICD-10-CM

## 2010-07-17 DIAGNOSIS — E1169 Type 2 diabetes mellitus with other specified complication: Secondary | ICD-10-CM

## 2010-07-17 DIAGNOSIS — E119 Type 2 diabetes mellitus without complications: Secondary | ICD-10-CM

## 2010-07-17 DIAGNOSIS — E669 Obesity, unspecified: Secondary | ICD-10-CM

## 2010-07-17 DIAGNOSIS — I152 Hypertension secondary to endocrine disorders: Secondary | ICD-10-CM

## 2010-07-17 DIAGNOSIS — E1159 Type 2 diabetes mellitus with other circulatory complications: Secondary | ICD-10-CM

## 2010-07-17 DIAGNOSIS — I10 Essential (primary) hypertension: Secondary | ICD-10-CM

## 2010-07-17 DIAGNOSIS — I2510 Atherosclerotic heart disease of native coronary artery without angina pectoris: Secondary | ICD-10-CM

## 2010-07-17 LAB — POCT GLYCOSYLATED HEMOGLOBIN (HGB A1C): Hemoglobin A1C: 6.5

## 2010-07-17 LAB — HEPATIC FUNCTION PANEL
ALT: 16 U/L (ref 0–53)
AST: 15 U/L (ref 0–37)
Bilirubin, Direct: 0.1 mg/dL (ref 0.0–0.3)
Indirect Bilirubin: 0.5 mg/dL (ref 0.0–0.9)
Total Protein: 6.5 g/dL (ref 6.0–8.3)

## 2010-07-17 LAB — LIPID PANEL
Cholesterol: 139 mg/dL (ref 0–200)
Triglycerides: 162 mg/dL — ABNORMAL HIGH (ref <150)

## 2010-07-17 NOTE — Progress Notes (Signed)
  Subjective:    Patient ID: Jerome Rodriguez, male    DOB: 02-22-1955, 55 y.o.   MRN: [id number]  HPI He is here for a recheck. He continues on medications listed in the chart. He is not exercising as much as he should and recognizes this; still smokes cigars and rarely drinks. He has had an eye exam. He does check his feet periodically. Does check his blood sugars periodically and they run in the low 100 range. He has had no chest pain, shortness of breath or DOE. He had a stent placed in 2003.  Review of Systems     Objective:   Physical Exam And alert and in no distress. Hemoglobin A1c is 6.5.       Assessment & Plan:  Diabetes. ASHD. Obesity. Hypertension. Hyperlipidemia. I will check lipid panel on him today. Encouraged him to do a better job of taking care of himself especially diet and exercise.

## 2010-07-17 NOTE — Patient Instructions (Signed)
Keep working on [employer] and exercise. Don't forget to check your blood sugars

## 2010-07-18 ENCOUNTER — Telehealth: Payer: Self-pay

## 2010-07-18 NOTE — Telephone Encounter (Signed)
Called and informed pt labs ok and mailed a copy

## 2010-08-14 ENCOUNTER — Other Ambulatory Visit: Payer: Self-pay | Admitting: *Deleted

## 2010-08-14 MED ORDER — ENALAPRIL MALEATE 2.5 MG PO TABS: 2.5000 mg | ORAL_TABLET | Freq: Every day | ORAL | Status: DC

## 2010-09-11 ENCOUNTER — Ambulatory Visit (INDEPENDENT_AMBULATORY_CARE_PROVIDER_SITE_OTHER): Payer: 59 | Admitting: Cardiovascular Disease

## 2010-09-11 ENCOUNTER — Ambulatory Visit: Payer: 59 | Admitting: Cardiovascular Disease

## 2010-09-11 ENCOUNTER — Encounter: Payer: Self-pay | Admitting: Cardiovascular Disease

## 2010-09-11 VITALS — BP 104/68 | HR 86 | Ht 69.0 in | Wt 248.8 lb

## 2010-09-11 DIAGNOSIS — E785 Hyperlipidemia, unspecified: Secondary | ICD-10-CM

## 2010-09-11 DIAGNOSIS — I2510 Atherosclerotic heart disease of native coronary artery without angina pectoris: Secondary | ICD-10-CM

## 2010-09-11 DIAGNOSIS — I10 Essential (primary) hypertension: Secondary | ICD-10-CM

## 2010-09-11 NOTE — Patient Instructions (Signed)
Your physician has requested that you have an exercise tolerance test. For further information please visit [email or web address] Please also follow instruction sheet, as given. To be done with Dr. Excell Seltzer

## 2010-09-15 ENCOUNTER — Encounter: Payer: Self-pay | Admitting: Cardiovascular Disease

## 2010-09-15 NOTE — Assessment & Plan Note (Signed)
Recent lipids reviewed with LDL at goal in the 70s. Continue statin therapy.

## 2010-09-15 NOTE — Assessment & Plan Note (Signed)
The patient's cardiac catheter report from 2004 was reviewed. He had extensive single vessel CAD involving the left anterior descending. His only symptom is exertional dyspnea, and I have recommended a Myoview stress study to rule out significant ischemia encased dyspnea is his anginal equivalent. He will continue his current medical program as outlined.

## 2010-09-15 NOTE — Progress Notes (Signed)
HPI:  This is a 55 year old gentleman presented evaluation. The patient has coronary artery disease and underwent stenting of the LAD using Taxus drug-eluting stent platforms in 2004. He presented at that time with non-ST elevation infarction.  The patient currently complains of generalized fatigue and exertional dyspnea. He denies chest pain or pressure. He denies palpitations, orthopnea, or PND. He has not been engaged in any regular exercise. He is smoking cigars on a regular basis.  Outpatient Encounter Prescriptions as of 09/11/2010  Medication Sig Dispense Refill  . aspirin 325 MG EC tablet Take 325 mg by mouth daily.        Marland Kitchen atorvastatin (LIPITOR) 20 MG tablet TAKE ONE TABLET BY MOUTH DAILY.  90 tablet  3  . enalapril (VASOTEC) 2.5 MG tablet Take 1 tablet (2.5 mg total) by mouth daily.  90 tablet  0  . metFORMIN (GLUCOPHAGE-XR) 750 MG 24 hr tablet Take 750 mg by mouth 2 (two) times daily at 10 AM and 5 PM.        . metoprolol succinate (TOPROL-XL) 25 MG 24 hr tablet TAKE 1 TABLET BY MOUTH ONCE DAILY  90 tablet  3  . Multiple Vitamin (MULTIVITAMIN) tablet Take 1 tablet by mouth daily.        Marland Kitchen DISCONTD: ergocalciferol (VITAMIN D2) 50000 UNITS capsule Take 50,000 Units by mouth once a week.        Marland Kitchen DISCONTD: tadalafil (CIALIS) 10 MG tablet Take 10 mg by mouth daily as needed. 30 min prior to sexual activity         No Known Allergies  Past Medical History  Diagnosis Date  . CAD (coronary artery disease)   . Dyslipidemia   . HTN (hypertension)   . Obesity   . Diabetes mellitus   . ASHD (arteriosclerotic heart disease)     STENT  . ED (erectile dysfunction)   . Smoker     CIGARS    ROS: Negative except as per HPI  BP 104/68  Pulse 86  Ht 5\' 9"  (1.753 m)  Wt 248 lb 12.8 oz (112.855 kg)  BMI 36.74 kg/m2  PHYSICAL EXAM: Pt is alert and oriented, obese male in NAD HEENT: normal Neck: JVP - normal, carotids 2+= without bruits Lungs: CTA bilaterally CV: RRR without murmur or  gallop Abd: soft, obese, NT, Positive BS Ext: no C/C/E, distal pulses intact and equal Skin: warm/dry no rash  EKG:  Normal sinus rhythm 86 beats per minute, right ventricular conduction delay, otherwise within normal limits.  ASSESSMENT AND PLAN:

## 2010-09-15 NOTE — Assessment & Plan Note (Signed)
Blood pressure is well controlled on a combination of enalapril and metoprolol.

## 2010-09-18 ENCOUNTER — Telehealth: Payer: Self-pay | Admitting: Family Medicine

## 2010-09-18 ENCOUNTER — Other Ambulatory Visit: Payer: Self-pay | Admitting: Family Medicine

## 2010-09-18 MED ORDER — METFORMIN HCL ER 750 MG PO TB24: 750.0000 mg | ORAL_TABLET | Freq: Two times a day (BID) | ORAL | Status: DC

## 2010-09-18 NOTE — Telephone Encounter (Signed)
Metformin renewed

## 2010-10-21 ENCOUNTER — Encounter: Payer: Self-pay | Admitting: Cardiovascular Disease

## 2010-10-21 ENCOUNTER — Ambulatory Visit (INDEPENDENT_AMBULATORY_CARE_PROVIDER_SITE_OTHER): Payer: 59 | Admitting: Cardiovascular Disease

## 2010-10-21 DIAGNOSIS — I2510 Atherosclerotic heart disease of native coronary artery without angina pectoris: Secondary | ICD-10-CM

## 2010-10-21 NOTE — Progress Notes (Signed)
Exercise Treadmill Test  Pre-Exercise Testing Evaluation Rhythm: normal sinus  Rate: 83   PR:  .14 QRS:  .08  QT:  .37 QTc: .43     Test  Exercise Tolerance Test Ordering MD: Tonny Bollman, MD  Interpreting MD:  Tonny Bollman, MD  Unique Test No: 1  Treadmill:  1  Indication for ETT: known ASHD  Contraindication to ETT: No   Stress Modality: exercise - treadmill  Cardiac Imaging Performed: non   Protocol: standard Bruce - maximal  Max BP:  192/72  Max MPHR (bpm):  163 85% MPR (bpm):  139  MPHR obtained (bpm):  141 % MPHR obtained: 86  Reached 85% MPHR (min:sec):  5:50 Total Exercise Time (min-sec):  6:00  Workload in METS:  7.0 Borg Scale: 19  Reason ETT Terminated:  dyspnea    ST Segment Analysis At Rest: normal ST segments - no evidence of significant ST depression With Exercise: no evidence of significant ST depression  Other Information Arrhythmia:  No Angina during ETT:  absent (0) Quality of ETT:  diagnostic  ETT Interpretation:  normal - no evidence of ischemia by ST analysis  Comments: Poor exercise tolerance. No significant ST changes with exertion. No angina or arrhythmia with exercise.   Recommendations: Graded exercise program.

## 2010-11-17 ENCOUNTER — Ambulatory Visit (INDEPENDENT_AMBULATORY_CARE_PROVIDER_SITE_OTHER): Payer: 59 | Admitting: Family Medicine

## 2010-11-17 ENCOUNTER — Encounter: Payer: Self-pay | Admitting: Family Medicine

## 2010-11-17 DIAGNOSIS — E119 Type 2 diabetes mellitus without complications: Secondary | ICD-10-CM

## 2010-11-17 DIAGNOSIS — Z23 Encounter for immunization: Secondary | ICD-10-CM

## 2010-11-17 DIAGNOSIS — E785 Hyperlipidemia, unspecified: Secondary | ICD-10-CM

## 2010-11-17 DIAGNOSIS — E669 Obesity, unspecified: Secondary | ICD-10-CM

## 2010-11-17 DIAGNOSIS — I10 Essential (primary) hypertension: Secondary | ICD-10-CM

## 2010-11-17 DIAGNOSIS — I2510 Atherosclerotic heart disease of native coronary artery without angina pectoris: Secondary | ICD-10-CM

## 2010-11-17 NOTE — Patient Instructions (Signed)
Increase your exercise to walking daily. Instead of smoking a cigar go for a walk.

## 2010-11-17 NOTE — Progress Notes (Signed)
  Subjective:    Patient ID: Jerome Rodriguez, male    DOB: 1955/10/28, 55 y.o.   MRN: [id number]  HPI He is here for a diabetes and general recheck. He continues on medications listed in the chart. His exercise pattern is fairly decent with 2 or 3 times per week. He does smoke a cigar periodically. Continues on his present medications. He's had no chest pain, shortness of breath. He has an eye exam in February. He does check his feet periodically. He states his blood sugars run on average 142.   Review of Systems     Objective:   Physical Exam Alert and in no distress otherwise not examined       Assessment & Plan:   1. ASHD (arteriosclerotic heart disease)   2. Diabetes mellitus   3. HYPERLIPIDEMIA-MIXED   4. HYPERTENSION, BENIGN   5. Obesity (BMI 30-39.9)    I had a long discussion with him concerning last modification in regard to his risk for diabetes damages and from heart disease. Strongly encouraged him to make further changes in his diet and exercise pattern as well as stopping the cigar smoking

## 2010-12-16 ENCOUNTER — Other Ambulatory Visit: Payer: Self-pay | Admitting: Cardiovascular Disease

## 2011-01-15 ENCOUNTER — Encounter: Payer: Self-pay | Admitting: Family Medicine

## 2011-01-15 ENCOUNTER — Ambulatory Visit (INDEPENDENT_AMBULATORY_CARE_PROVIDER_SITE_OTHER): Payer: 59 | Admitting: Family Medicine

## 2011-01-15 VITALS — BP 130/80 | HR 77 | Ht 68.0 in | Wt 240.0 lb

## 2011-01-15 DIAGNOSIS — L309 Dermatitis, unspecified: Secondary | ICD-10-CM

## 2011-01-15 DIAGNOSIS — L259 Unspecified contact dermatitis, unspecified cause: Secondary | ICD-10-CM

## 2011-01-15 NOTE — Progress Notes (Signed)
  Subjective:    Patient ID: Jerome Rodriguez, male    DOB: December 15, 1955, 56 y.o.   MRN: [id number]  HPI He is here for evaluation of a rash present on his left knee. He apparently had the same problem occur approximately 1 year ago but it went away on its own.   Review of Systems     Objective:   Physical Exam Exam of the left knee does show purpleish irregular lesions present only on his left knee. Exam of other parts of his body showed no other lesions.       Assessment & Plan:   1. Dermatitis  Ambulatory referral to Dermatology

## 2011-02-17 ENCOUNTER — Ambulatory Visit: Payer: 59 | Admitting: Family Medicine

## 2011-03-13 ENCOUNTER — Other Ambulatory Visit: Payer: Self-pay | Admitting: Cardiovascular Disease

## 2011-03-17 ENCOUNTER — Encounter: Payer: Self-pay | Admitting: Family Medicine

## 2011-03-17 ENCOUNTER — Telehealth: Payer: Self-pay | Admitting: Internal Medicine

## 2011-03-17 ENCOUNTER — Ambulatory Visit (INDEPENDENT_AMBULATORY_CARE_PROVIDER_SITE_OTHER): Payer: 59 | Admitting: Family Medicine

## 2011-03-17 DIAGNOSIS — E785 Hyperlipidemia, unspecified: Secondary | ICD-10-CM

## 2011-03-17 DIAGNOSIS — I2510 Atherosclerotic heart disease of native coronary artery without angina pectoris: Secondary | ICD-10-CM

## 2011-03-17 DIAGNOSIS — I10 Essential (primary) hypertension: Secondary | ICD-10-CM

## 2011-03-17 DIAGNOSIS — E119 Type 2 diabetes mellitus without complications: Secondary | ICD-10-CM

## 2011-03-17 MED ORDER — GLUCOSE BLOOD VI STRP: ORAL_STRIP | Status: DC

## 2011-03-17 NOTE — Progress Notes (Signed)
  Subjective:    Patient ID: Jerome Rodriguez, male    DOB: Apr 20, 1955, 56 y.o.   MRN: [id number]  HPI He is here for a followup on his diabetes. He admits to not exercising regularly citing the weather. He continues to smoke cigars. He states his eating habits are good. He does occasionally check his blood sugars. He has seen a podiatrist and is using an ointment on his left heel. He rarely drinks. He has had no difficulty with chest pain, shortness of breath.   Review of Systems Negative except as above    Objective:   Physical Exam Alert and in no distress. Left heel does show some thickening and fissuring laterally. Hemoglobin A1c 7.2      Assessment & Plan:   1. Diabetes mellitus  POCT HgB A1C  2. ASHD (arteriosclerotic heart disease)    3. HYPERLIPIDEMIA-MIXED    4. HYPERTENSION, BENIGN     recommend he continue to use the ointment on his heel and used the palm a stone to remove the thickened skin. Also strongly encouraged him to start a walking program. Discussed making lifestyle changes to help get rid of his excess weight. Discussed adding another medication however we will wait on this.

## 2011-03-17 NOTE — Telephone Encounter (Signed)
Ordered med.

## 2011-03-17 NOTE — Telephone Encounter (Signed)
Go ahead and renew this 

## 2011-03-17 NOTE — Patient Instructions (Signed)
MOVE

## 2011-03-26 ENCOUNTER — Telehealth: Payer: Self-pay | Admitting: Internal Medicine

## 2011-03-26 MED ORDER — METFORMIN HCL ER 750 MG PO TB24: 750.0000 mg | ORAL_TABLET | Freq: Two times a day (BID) | ORAL | Status: DC

## 2011-03-26 NOTE — Telephone Encounter (Signed)
Med sent in.

## 2011-06-23 ENCOUNTER — Other Ambulatory Visit: Payer: Self-pay | Admitting: *Deleted

## 2011-06-23 MED ORDER — ENALAPRIL MALEATE 2.5 MG PO TABS: 2.5000 mg | ORAL_TABLET | Freq: Every day | ORAL | Status: DC

## 2011-06-24 ENCOUNTER — Other Ambulatory Visit: Payer: Self-pay | Admitting: *Deleted

## 2011-06-24 MED ORDER — ATORVASTATIN CALCIUM 20 MG PO TABS: 20.0000 mg | ORAL_TABLET | Freq: Every day | ORAL | Status: DC

## 2011-07-16 ENCOUNTER — Encounter: Payer: Self-pay | Admitting: Family Medicine

## 2011-07-16 ENCOUNTER — Ambulatory Visit (INDEPENDENT_AMBULATORY_CARE_PROVIDER_SITE_OTHER): Payer: 59 | Admitting: Family Medicine

## 2011-07-16 VITALS — BP 120/70 | HR 80 | Wt 241.0 lb

## 2011-07-16 DIAGNOSIS — E119 Type 2 diabetes mellitus without complications: Secondary | ICD-10-CM

## 2011-07-16 DIAGNOSIS — Z79899 Other long term (current) drug therapy: Secondary | ICD-10-CM

## 2011-07-16 DIAGNOSIS — E669 Obesity, unspecified: Secondary | ICD-10-CM | POA: Insufficient documentation

## 2011-07-16 DIAGNOSIS — I10 Essential (primary) hypertension: Secondary | ICD-10-CM

## 2011-07-16 DIAGNOSIS — I2510 Atherosclerotic heart disease of native coronary artery without angina pectoris: Secondary | ICD-10-CM

## 2011-07-16 DIAGNOSIS — E785 Hyperlipidemia, unspecified: Secondary | ICD-10-CM

## 2011-07-16 LAB — COMPREHENSIVE METABOLIC PANEL
BUN: 10 mg/dL (ref 6–23)
CO2: 26 mEq/L (ref 19–32)
Creat: 0.97 mg/dL (ref 0.50–1.35)
Glucose, Bld: 123 mg/dL — ABNORMAL HIGH (ref 70–99)
Sodium: 137 mEq/L (ref 135–145)
Total Bilirubin: 0.6 mg/dL (ref 0.3–1.2)
Total Protein: 6.1 g/dL (ref 6.0–8.3)

## 2011-07-16 LAB — CBC WITH DIFFERENTIAL/PLATELET
Eosinophils Absolute: 0.2 10*3/uL (ref 0.0–0.7)
Eosinophils Relative: 3 % (ref 0–5)
HCT: 39.9 % (ref 39.0–52.0)
Hemoglobin: 14.1 g/dL (ref 13.0–17.0)
Lymphs Abs: 2.1 10*3/uL (ref 0.7–4.0)
MCH: 29.6 pg (ref 26.0–34.0)
MCHC: 35.3 g/dL (ref 30.0–36.0)
MCV: 83.8 fL (ref 78.0–100.0)
Monocytes Absolute: 0.5 10*3/uL (ref 0.1–1.0)
Monocytes Relative: 6 % (ref 3–12)
RBC: 4.76 MIL/uL (ref 4.22–5.81)

## 2011-07-16 LAB — LIPID PANEL
Cholesterol: 132 mg/dL (ref 0–200)
HDL: 31 mg/dL — ABNORMAL LOW (ref >39)
Triglycerides: 81 mg/dL (ref <150)
VLDL: 16 mg/dL (ref 0–40)

## 2011-07-16 NOTE — Progress Notes (Signed)
  Subjective:    Patient ID: Jerome Rodriguez, male    DOB: 02-09-1955, 56 y.o.   MRN: [id number]  HPI He is here for recheck. He continues on medications listed in the chart. His activity is again quite limited. He has lost some weight however. He continues to smoke cigars. He does rarely drink. He does check his blood sugars periodically as well as his feet. He's had no difficulty with chest pain, shortness of breath or weakness. He gets yearly eye exams. Blood work was reviewed. He states in the past he did have low vitamin D levels but does take a multivitamin.   Review of Systems     Objective:   Physical Exam Alert and in no distress. Hemoglobin A1c is 6.7       Assessment & Plan:   1. Type II or unspecified type diabetes mellitus without mention of complication, not stated as uncontrolled  POCT glycosylated hemoglobin (Hb A1C)  2. Diabetes mellitus  CBC with Differential, Comprehensive metabolic panel, Lipid panel  3. HYPERLIPIDEMIA-MIXED  Lipid panel  4. HYPERTENSION, BENIGN    5. ASHD (arteriosclerotic heart disease)    6. Obesity (BMI 30-39.9)    7. Encounter for long-term (current) use of other medications  Vitamin D 25 hydroxy   I encouraged him to continue with his weight loss regimen. Will do routine blood screening on him. Discussed his cigar smoking however he does not smoke that much and is slowly cutting back. We'll also check a vitamin D level

## 2011-07-17 NOTE — Progress Notes (Signed)
Quick Note:  The blood work is normal ______ 

## 2011-07-29 ENCOUNTER — Other Ambulatory Visit: Payer: Self-pay | Admitting: *Deleted

## 2011-07-29 MED ORDER — METOPROLOL SUCCINATE ER 25 MG PO TB24: 25.0000 mg | ORAL_TABLET | Freq: Every day | ORAL | Status: DC

## 2011-09-21 ENCOUNTER — Other Ambulatory Visit: Payer: Self-pay | Admitting: Cardiovascular Disease

## 2011-09-21 MED ORDER — ENALAPRIL MALEATE 2.5 MG PO TABS: 2.5000 mg | ORAL_TABLET | Freq: Every day | ORAL | Status: DC

## 2011-10-01 ENCOUNTER — Telehealth: Payer: Self-pay | Admitting: Family Medicine

## 2011-10-01 DIAGNOSIS — E119 Type 2 diabetes mellitus without complications: Secondary | ICD-10-CM

## 2011-10-01 MED ORDER — METFORMIN HCL ER 750 MG PO TB24: 750.0000 mg | ORAL_TABLET | Freq: Two times a day (BID) | ORAL | Status: DC

## 2011-10-02 NOTE — Telephone Encounter (Signed)
DONE

## 2011-10-28 ENCOUNTER — Encounter: Payer: Self-pay | Admitting: Cardiovascular Disease

## 2011-10-28 ENCOUNTER — Ambulatory Visit (INDEPENDENT_AMBULATORY_CARE_PROVIDER_SITE_OTHER): Payer: 59 | Admitting: Cardiovascular Disease

## 2011-10-28 VITALS — BP 110/62 | HR 75 | Ht 70.0 in | Wt 243.8 lb

## 2011-10-28 DIAGNOSIS — I2510 Atherosclerotic heart disease of native coronary artery without angina pectoris: Secondary | ICD-10-CM

## 2011-10-28 NOTE — Patient Instructions (Addendum)
Your physician wants you to follow-up in: ONE YEAR WITH DR Theodoro Parma will receive a reminder letter in the mail two months in advance. If you don't receive a letter, please call our office to schedule the follow-up appointment.   Your physician has requested that you have en exercise stress myoview. For further information please visit [email or web address] Please follow instruction sheet, as given.

## 2011-10-28 NOTE — Progress Notes (Signed)
HPI:  56 year old gentleman presenting for followup evaluation. The patient has coronary artery disease and underwent stenting of the LAD in 2004 with a Taxus drug-eluting stent when he presented with non-ST elevation MI. He also has type 2 diabetes and hypertension. Labs have been drawn within the last 3 months and this demonstrates normal renal function with a creatinine of 0.97, normal liver function tests, a total cholesterol 132, crit was read 81, HDL 31, and LDL 85. His last exercise stress test was in October 2012 and this demonstrated poor exercise tolerance without ST segment changes or angina.  He reports an episode last month but felt similar to his heart attack. It occurred after he had dinner and he felt lightheaded and short of breath. He's not had a recurrence of this. He did not have chest pain or pressure. He notes that his blood sugar was elevated at the time, but wasn't sure if these 2 things were related. He has not been particularly active. He does some walking but states it is "not as much as I should." She's been trying to watch his diet and has lost 5 pounds since his visit here last year.  Outpatient Encounter Prescriptions as of 10/28/2011  Medication Sig Dispense Refill  . aspirin 325 MG EC tablet Take 325 mg by mouth daily.        Marland Kitchen atorvastatin (LIPITOR) 20 MG tablet Take 1 tablet (20 mg total) by mouth daily.  90 tablet  1  . enalapril (VASOTEC) 2.5 MG tablet Take 1 tablet (2.5 mg total) by mouth daily.  90 tablet  0  . glucose blood test strip Use as directed  300 each  1  . loratadine (CLARITIN) 10 MG tablet Take 10 mg by mouth daily.      . metFORMIN (GLUCOPHAGE-XR) 750 MG 24 hr tablet Take 1 tablet (750 mg total) by mouth 2 (two) times daily at 10 AM and 5 PM.  60 tablet  1  . metoprolol succinate (TOPROL-XL) 25 MG 24 hr tablet Take 1 tablet (25 mg total) by mouth daily.  90 tablet  3  . Multiple Vitamin (MULTIVITAMIN) tablet Take 1 tablet by mouth daily.           No Known Allergies  Past Medical History  Diagnosis Date  . CAD (coronary artery disease)   . Dyslipidemia   . HTN (hypertension)   . Obesity   . Diabetes mellitus   . ASHD (arteriosclerotic heart disease)     STENT  . ED (erectile dysfunction)   . Smoker     CIGARS    ROS: Negative except as per HPI  BP 110/62  Pulse 75  Ht 5\' 10"  (1.778 m)  Wt 110.587 kg (243 lb 12.8 oz)  BMI 34.98 kg/m2  PHYSICAL EXAM: Pt is alert and oriented, pleasant overweight male in NAD HEENT: normal Neck: JVP - normal, carotids 2+= without bruits Lungs: CTA bilaterally CV: RRR without murmur or gallop Abd: soft, NT, Positive BS, no hepatomegaly Ext: no C/C/E, distal pulses intact and equal Skin: warm/dry no rash  EKG:  Normal sinus rhythm 75 beats per minute, RSR prime pattern in V1 suggestive of RV conduction delay, otherwise within normal limits.  ASSESSMENT AND PLAN: 1. Coronary artery disease, native vessel. The patient had recurrent symptoms concerning for anginal equivalent. He is approaching 10 years out from his PCI procedure. I think we should proceed with exercise nuclear stress testing for further evaluation of potential myocardial ischemia. He otherwise  will continue his current medical program.  2. Hyperlipidemia. The patient is on a statin drug in his lipids are at goal.  3. Hypertension. Blood pressure is controlled on appropriate medication with metoprolol succinate and enalapril.  4. Tobacco abuse. The patient continues to smoke cigars. Cessation counseling was done. He will consider a nicotine patch or electronic cigarettes.  Tonny Bollman 10/28/2011 9:19 AM

## 2011-11-11 ENCOUNTER — Ambulatory Visit (HOSPITAL_COMMUNITY): Payer: 59 | Attending: Cardiovascular Disease | Admitting: Radiology

## 2011-11-11 VITALS — BP 95/63 | Ht 70.0 in | Wt 241.0 lb

## 2011-11-11 DIAGNOSIS — E119 Type 2 diabetes mellitus without complications: Secondary | ICD-10-CM | POA: Insufficient documentation

## 2011-11-11 DIAGNOSIS — R079 Chest pain, unspecified: Secondary | ICD-10-CM | POA: Insufficient documentation

## 2011-11-11 DIAGNOSIS — F17200 Nicotine dependence, unspecified, uncomplicated: Secondary | ICD-10-CM | POA: Insufficient documentation

## 2011-11-11 DIAGNOSIS — R0989 Other specified symptoms and signs involving the circulatory and respiratory systems: Secondary | ICD-10-CM | POA: Insufficient documentation

## 2011-11-11 DIAGNOSIS — I2510 Atherosclerotic heart disease of native coronary artery without angina pectoris: Secondary | ICD-10-CM

## 2011-11-11 DIAGNOSIS — R55 Syncope and collapse: Secondary | ICD-10-CM | POA: Insufficient documentation

## 2011-11-11 DIAGNOSIS — R0609 Other forms of dyspnea: Secondary | ICD-10-CM | POA: Insufficient documentation

## 2011-11-11 DIAGNOSIS — R002 Palpitations: Secondary | ICD-10-CM | POA: Insufficient documentation

## 2011-11-11 DIAGNOSIS — I10 Essential (primary) hypertension: Secondary | ICD-10-CM | POA: Insufficient documentation

## 2011-11-11 DIAGNOSIS — R0602 Shortness of breath: Secondary | ICD-10-CM | POA: Insufficient documentation

## 2011-11-11 MED ORDER — TECHNETIUM TC 99M SESTAMIBI GENERIC - CARDIOLITE
10.0000 | Freq: Once | INTRAVENOUS | Status: AC | PRN
  Administered 2011-11-11: 10 via INTRAVENOUS

## 2011-11-11 MED ORDER — TECHNETIUM TC 99M SESTAMIBI GENERIC - CARDIOLITE
30.0000 | Freq: Once | INTRAVENOUS | Status: AC | PRN
  Administered 2011-11-11: 30 via INTRAVENOUS

## 2011-11-11 NOTE — Progress Notes (Signed)
[facility] SITE 3 NUCLEAR MED [id number] [place] [address] [phone]  Cardiology Nuclear Med Study  JANMICHAEL Rodriguez is a 56 y.o. male     MRN : [id number]     DOB: 01-25-1955  Procedure Date: 11/11/2011  Nuclear Med Background Indication for Stress Test:  Evaluation for Ischemia and Stent Patency History:  2004: MI-NSTEMI-Heart Cath-Stents-LAD 01/21/2004: MPS: (-) ischemia EF: 68% 10/21/10 GXT:(-) Poor exercise Tolerance  86%  Cardiac Risk Factors: Family History - CAD, Hypertension, Lipids, NIDDM and Smoker  Symptoms:  Chest Pain, DOE, Light-Headedness, Near Syncope, Palpitations and SOB   Nuclear Pre-Procedure Caffeine/Decaff Intake:  None > 12 hrs NPO After: 8:00pm   Lungs:  clear O2 Sat: 95% on room air. IV 0.9% NS with Angio Cath:  20g  IV Site: R Antecubital x 1, tolerated well IV Started by:  Irean Hong, RN  Chest Size (in):  46 Cup Size: n/a  Height: 5\' 10"  (1.778 m)  Weight:  241 lb (109.317 kg)  BMI:  Body mass index is 34.58 kg/(m^2). Tech Comments:  Held Psychologist, clinical Med Study 1 or 2 day study: 1 day  Stress Test Type:  Stress  Reading MD: Charlton Haws, MD  Order Authorizing Provider:  Tonny Bollman, MD  Resting Radionuclide: Technetium 57m Sestamibi  Resting Radionuclide Dose: 11.0 mCi   Stress Radionuclide:  Technetium 18m Sestamibi  Stress Radionuclide Dose: 33.0 mCi           Stress Protocol Rest HR: 82 Stress HR: 151  Rest BP: 95/63 Stress BP: 178/65  Exercise Time (min): 7:00 METS: 8.50   Predicted Max HR: 163 bpm % Max HR: 92.64 bpm Rate Pressure Product: 62952   Dose of Adenosine (mg):  n/a Dose of Lexiscan: 0.4 mg  Dose of Atropine (mg): n/a Dose of Dobutamine: n/a mcg/kg/min (at max HR)  Stress Test Technologist: Milana Na, EMT-P  Nuclear Technologist:  Domenic Polite, CNMT     Rest Procedure:  Myocardial perfusion imaging was performed at rest 45 minutes following the  intravenous administration of Technetium 71m Sestamibi. Rest ECG: NSR - Normal EKG  Stress Procedure:  The patient performed treadmill exercise using a Bruce  Protocol for 7:00 minutes. The patient stopped due to sob, fatigue, and denied any chest pain.  There were no significant ST-T wave changes and a rare pvc/pac.  Technetium 42m Sestamibi was injected at peak exercise and myocardial perfusion imaging was performed after a brief delay. Stress ECG: No significant change from baseline ECG  QPS Raw Data Images:  Normal; no motion artifact; normal heart/lung ratio. Stress Images:  Normal homogeneous uptake in all areas of the myocardium. Rest Images:  Normal homogeneous uptake in all areas of the myocardium. Subtraction (SDS):  Normal Transient Ischemic Dilatation (Normal <1.22):  0.90 Lung/Heart Ratio (Normal <0.45):  0.35  Quantitative Gated Spect Images QGS EDV:  72 ml QGS ESV:  19 ml  Impression Exercise Capacity:  Fair exercise capacity. BP Response:  Normal blood pressure response. Clinical Symptoms:  There is dyspnea. ECG Impression:  No significant ST segment change suggestive of ischemia. Comparison with Prior Nuclear Study: No images to compare  Overall Impression:  Normal stress nuclear study.  LV Ejection Fraction: 74%.  LV Wall Motion:  NL LV Function; NL Wall Motion   Charlton Haws

## 2011-11-16 ENCOUNTER — Encounter: Payer: Self-pay | Admitting: Cardiovascular Disease

## 2011-11-16 NOTE — Telephone Encounter (Signed)
This encounter was created in error - please disregard.

## 2011-11-16 NOTE — Telephone Encounter (Signed)
New Problem: ° ° ° °Patient returned your call.  Please call back. °

## 2011-11-19 ENCOUNTER — Ambulatory Visit: Payer: 59 | Admitting: Family Medicine

## 2011-11-19 ENCOUNTER — Ambulatory Visit (INDEPENDENT_AMBULATORY_CARE_PROVIDER_SITE_OTHER): Payer: 59 | Admitting: Family Medicine

## 2011-11-19 ENCOUNTER — Encounter: Payer: Self-pay | Admitting: Family Medicine

## 2011-11-19 VITALS — BP 124/74 | HR 80 | Wt 243.0 lb

## 2011-11-19 DIAGNOSIS — I10 Essential (primary) hypertension: Secondary | ICD-10-CM

## 2011-11-19 DIAGNOSIS — I2510 Atherosclerotic heart disease of native coronary artery without angina pectoris: Secondary | ICD-10-CM

## 2011-11-19 DIAGNOSIS — E119 Type 2 diabetes mellitus without complications: Secondary | ICD-10-CM

## 2011-11-19 DIAGNOSIS — E785 Hyperlipidemia, unspecified: Secondary | ICD-10-CM

## 2011-11-19 DIAGNOSIS — E669 Obesity, unspecified: Secondary | ICD-10-CM

## 2011-11-19 LAB — POCT GLYCOSYLATED HEMOGLOBIN (HGB A1C): Hemoglobin A1C: 7.6

## 2011-11-19 NOTE — Progress Notes (Signed)
  Subjective:    Patient ID: Jerome Rodriguez, male    DOB: 1955/08/29, 56 y.o.   MRN: [id number]  HPI He is here for a diabetes recheck. He continues on medications listed in the chart. He does complain of symptoms of headache, sweating, dizziness for the last 6 months. When he checks his blood sugar is in the low 100 range and he says it does respond to eating. He does say he exercises regularly. Still smokes a cigar. He does not drink. He is considering getting out of his present marriage apparently mainly due to shoes with the step son and his lack of getting gainfully employed. He has been seen recently by his cardiologist and apparently the stress test was negative. He sees a podiatrist regularly. He had an eye exam in February.   Review of Systems     Objective:   Physical Exam Alert and in no distress. Hemoglobin A1c is 7.6       Assessment & Plan:   1. Obesity (BMI 30-39.9)  Amb ref to Medical Nutrition Therapy-MNT  2. HYPERTENSION, BENIGN    3. HYPERLIPIDEMIA-MIXED    4. Diabetes mellitus  Amb ref to Medical Nutrition Therapy-MNT  5. ASHD (arteriosclerotic heart disease)     encouraged him to continue with his exercise regimen. I will set him up for nutritionist. I don't think that his sugars in the low 100s or causing his symptoms. He also then discussed the trouble he is having with his wife in fact he will probably be leaving in the near future.

## 2011-11-19 NOTE — Addendum Note (Signed)
Addended by: Barbette Or A on: 11/19/2011 04:05 PM   Modules accepted: Orders

## 2011-11-20 ENCOUNTER — Telehealth: Payer: Self-pay | Admitting: Cardiovascular Disease

## 2011-11-20 ENCOUNTER — Other Ambulatory Visit: Payer: Self-pay | Admitting: *Deleted

## 2011-11-20 ENCOUNTER — Telehealth: Payer: Self-pay | Admitting: Family Medicine

## 2011-11-20 ENCOUNTER — Telehealth: Payer: Self-pay | Admitting: Internal Medicine

## 2011-11-20 DIAGNOSIS — E119 Type 2 diabetes mellitus without complications: Secondary | ICD-10-CM

## 2011-11-20 MED ORDER — METFORMIN HCL ER 750 MG PO TB24: 750.0000 mg | ORAL_TABLET | Freq: Two times a day (BID) | ORAL | Status: DC

## 2011-11-20 MED ORDER — METOPROLOL SUCCINATE ER 25 MG PO TB24: 25.0000 mg | ORAL_TABLET | Freq: Every day | ORAL | Status: DC

## 2011-11-20 NOTE — Telephone Encounter (Signed)
Pt request 90 day sent in 90 day

## 2011-11-20 NOTE — Telephone Encounter (Signed)
Sent med in 

## 2011-11-20 NOTE — Telephone Encounter (Signed)
pt was in last week and metoprolol was not called in, pt now out and needs called into [place] [facility] asap

## 2011-11-25 ENCOUNTER — Telehealth: Payer: Self-pay | Admitting: Internal Medicine

## 2011-11-25 MED ORDER — BLOOD GLUCOSE METER KIT: PACK | Status: DC

## 2011-11-25 NOTE — Telephone Encounter (Signed)
Renew whatever he needs to for his diabetes

## 2011-11-25 NOTE — Telephone Encounter (Signed)
SENT IN FOR NEW  METER

## 2011-11-25 NOTE — Telephone Encounter (Signed)
[facility] has also sent over accuchek aviva plus test #300

## 2011-11-26 ENCOUNTER — Telehealth: Payer: Self-pay | Admitting: Family Medicine

## 2011-12-07 NOTE — Telephone Encounter (Signed)
dt ?

## 2011-12-16 ENCOUNTER — Ambulatory Visit: Payer: 59 | Admitting: Dietician

## 2012-01-11 ENCOUNTER — Other Ambulatory Visit: Payer: Self-pay | Admitting: Cardiovascular Disease

## 2012-01-11 ENCOUNTER — Other Ambulatory Visit: Payer: Self-pay | Admitting: Cardiology

## 2012-01-11 ENCOUNTER — Other Ambulatory Visit: Payer: Self-pay

## 2012-01-11 MED ORDER — ENALAPRIL MALEATE 2.5 MG PO TABS: 2.5000 mg | ORAL_TABLET | Freq: Every day | ORAL | Status: DC

## 2012-01-11 MED ORDER — ATORVASTATIN CALCIUM 20 MG PO TABS: 20.0000 mg | ORAL_TABLET | Freq: Every day | ORAL | Status: DC

## 2012-01-12 ENCOUNTER — Ambulatory Visit: Payer: 59 | Admitting: Dietician

## 2012-02-23 ENCOUNTER — Encounter: Payer: Self-pay | Admitting: Family Medicine

## 2012-03-18 ENCOUNTER — Ambulatory Visit: Payer: 59 | Admitting: Family Medicine

## 2012-03-18 ENCOUNTER — Encounter: Payer: Self-pay | Admitting: Family Medicine

## 2012-03-18 VITALS — BP 114/72 | HR 85 | Wt 233.0 lb

## 2012-03-18 DIAGNOSIS — E119 Type 2 diabetes mellitus without complications: Secondary | ICD-10-CM

## 2012-03-18 DIAGNOSIS — I10 Essential (primary) hypertension: Secondary | ICD-10-CM

## 2012-03-18 DIAGNOSIS — Z79899 Other long term (current) drug therapy: Secondary | ICD-10-CM

## 2012-03-18 DIAGNOSIS — E669 Obesity, unspecified: Secondary | ICD-10-CM

## 2012-03-18 DIAGNOSIS — E785 Hyperlipidemia, unspecified: Secondary | ICD-10-CM

## 2012-03-18 DIAGNOSIS — I2510 Atherosclerotic heart disease of native coronary artery without angina pectoris: Secondary | ICD-10-CM

## 2012-03-18 LAB — POCT UA - MICROALBUMIN: Albumin/Creatinine Ratio, Urine, POC: <3.7

## 2012-03-18 NOTE — Progress Notes (Signed)
PT STATES HE TEST 2 X A DAY B/S READING 110 TO 170 LAST FOOT EXAM 11/2011 LAST DIABETIC EYE EXAM 02/2012 [facility]

## 2012-03-18 NOTE — Progress Notes (Signed)
  Subjective:    Patient ID: Jerome Rodriguez, male    DOB: 1955-05-31, 57 y.o.   MRN: [id number]  HPI He is here for a recheck. He has lost approximately 10 pounds. He has been doing extra walking since his sister has been in this country. She was apparently evaluated at the [facility] and apparently has ALS. He continues on medications listed in the chart. He does check his blood sugars and thinks that when he drops into the low 100 range she has symptoms. He has been adjusting his medications based on this. He continues to smoke cigars. Foot and eye exam has been done within the last year. He has seen his cardiologist recently. He continues to work.   Review of Systems     Objective:   Physical Exam Alert and in no distress. Hemoglobin A1c is 6.8.       Assessment & Plan:  Diabetes mellitus - Plan: POCT UA - Microalbumin, POCT glycosylated hemoglobin (Hb A1C)  Obesity (BMI 30-39.9)  HYPERLIPIDEMIA-MIXED  HYPERTENSION, BENIGN  CAD, NATIVE VESSEL  Encounter for long-term (current) use of other medications encouraged him to continue with his exercise plan as well as weight loss. We discussed the fact that continued weight loss and increased physical activity will have a very positive effect in potentially be able to reduce his pill load. Discussed checking sugars either before meals or 2 hours after. Recommend he stay on present dosing of metformin and not adjust according to what his blood sugars are. Discussed low blood sugar readings usually been below 70 as opposed to 100 or 110 causing symptoms. I think he might be misinterpreting a blood sugar of 100 to 120s being low.

## 2012-04-04 ENCOUNTER — Other Ambulatory Visit: Payer: Self-pay | Admitting: Family Medicine

## 2012-07-10 ENCOUNTER — Encounter (HOSPITAL_COMMUNITY): Payer: Self-pay | Admitting: Emergency Medicine

## 2012-07-10 ENCOUNTER — Emergency Department (HOSPITAL_COMMUNITY): Payer: 59

## 2012-07-10 ENCOUNTER — Encounter: Payer: Self-pay | Admitting: Physician Assistant

## 2012-07-10 ENCOUNTER — Inpatient Hospital Stay (HOSPITAL_COMMUNITY)
Admission: EM | Admit: 2012-07-10 | Discharge: 2012-07-12 | DRG: 249 | Disposition: A | Discharge status: Home / Self Care | Payer: 59 | Attending: Cardiology | Admitting: Cardiology

## 2012-07-10 DIAGNOSIS — I200 Unstable angina: Secondary | ICD-10-CM

## 2012-07-10 DIAGNOSIS — E669 Obesity, unspecified: Secondary | ICD-10-CM | POA: Diagnosis present

## 2012-07-10 DIAGNOSIS — I10 Essential (primary) hypertension: Secondary | ICD-10-CM | POA: Diagnosis present

## 2012-07-10 DIAGNOSIS — I252 Old myocardial infarction: Secondary | ICD-10-CM

## 2012-07-10 DIAGNOSIS — I2510 Atherosclerotic heart disease of native coronary artery without angina pectoris: Secondary | ICD-10-CM | POA: Diagnosis present

## 2012-07-10 DIAGNOSIS — E119 Type 2 diabetes mellitus without complications: Secondary | ICD-10-CM | POA: Diagnosis present

## 2012-07-10 DIAGNOSIS — T82897A Other specified complication of cardiac prosthetic devices, implants and grafts, initial encounter: Secondary | ICD-10-CM | POA: Diagnosis present

## 2012-07-10 DIAGNOSIS — Y831 Surgical operation with implant of artificial internal device as the cause of abnormal reaction of the patient, or of later complication, without mention of misadventure at the time of the procedure: Secondary | ICD-10-CM | POA: Diagnosis present

## 2012-07-10 DIAGNOSIS — E785 Hyperlipidemia, unspecified: Secondary | ICD-10-CM | POA: Diagnosis present

## 2012-07-10 DIAGNOSIS — E118 Type 2 diabetes mellitus with unspecified complications: Secondary | ICD-10-CM | POA: Diagnosis present

## 2012-07-10 DIAGNOSIS — R079 Chest pain, unspecified: Secondary | ICD-10-CM | POA: Insufficient documentation

## 2012-07-10 DIAGNOSIS — R9389 Abnormal findings on diagnostic imaging of other specified body structures: Secondary | ICD-10-CM

## 2012-07-10 DIAGNOSIS — J438 Other emphysema: Secondary | ICD-10-CM | POA: Diagnosis present

## 2012-07-10 DIAGNOSIS — I214 Non-ST elevation (NSTEMI) myocardial infarction: Principal | ICD-10-CM | POA: Diagnosis present

## 2012-07-10 DIAGNOSIS — Z7982 Long term (current) use of aspirin: Secondary | ICD-10-CM

## 2012-07-10 DIAGNOSIS — F17200 Nicotine dependence, unspecified, uncomplicated: Secondary | ICD-10-CM | POA: Diagnosis present

## 2012-07-10 DIAGNOSIS — Z720 Tobacco use: Secondary | ICD-10-CM | POA: Diagnosis present

## 2012-07-10 DIAGNOSIS — E1159 Type 2 diabetes mellitus with other circulatory complications: Secondary | ICD-10-CM | POA: Diagnosis present

## 2012-07-10 DIAGNOSIS — Z79899 Other long term (current) drug therapy: Secondary | ICD-10-CM

## 2012-07-10 DIAGNOSIS — R918 Other nonspecific abnormal finding of lung field: Secondary | ICD-10-CM | POA: Diagnosis present

## 2012-07-10 HISTORY — DX: Abnormal findings on diagnostic imaging of other specified body structures: R93.89

## 2012-07-10 LAB — POCT I-STAT, CHEM 8
BUN: 16 mg/dL (ref 6–23)
Calcium, Ion: 1.16 mmol/L (ref 1.12–1.23)
Chloride: 100 mEq/L (ref 96–112)
Glucose, Bld: 148 mg/dL — ABNORMAL HIGH (ref 70–99)

## 2012-07-10 LAB — GLUCOSE, CAPILLARY
Glucose-Capillary: 154 mg/dL — ABNORMAL HIGH (ref 70–99)
Glucose-Capillary: 163 mg/dL — ABNORMAL HIGH (ref 70–99)

## 2012-07-10 LAB — COMPREHENSIVE METABOLIC PANEL
AST: 13 U/L (ref 0–37)
BUN: 13 mg/dL (ref 6–23)
CO2: 31 mEq/L (ref 19–32)
Chloride: 101 mEq/L (ref 96–112)
Creatinine, Ser: 0.91 mg/dL (ref 0.50–1.35)
GFR calc non Af Amer: >90 mL/min (ref >90)
Glucose, Bld: 218 mg/dL — ABNORMAL HIGH (ref 70–99)
Total Bilirubin: 0.5 mg/dL (ref 0.3–1.2)

## 2012-07-10 LAB — BASIC METABOLIC PANEL
BUN: 16 mg/dL (ref 6–23)
CO2: 26 mEq/L (ref 19–32)
Chloride: 98 mEq/L (ref 96–112)
Creatinine, Ser: 0.94 mg/dL (ref 0.50–1.35)

## 2012-07-10 LAB — HEMOGLOBIN A1C: Hgb A1c MFr Bld: 7.4 % — ABNORMAL HIGH (ref <5.7)

## 2012-07-10 LAB — PRO B NATRIURETIC PEPTIDE: Pro B Natriuretic peptide (BNP): 13.5 pg/mL (ref 0–125)

## 2012-07-10 LAB — CBC
HCT: 43.7 % (ref 39.0–52.0)
MCV: 84.2 fL (ref 78.0–100.0)
RBC: 5.19 MIL/uL (ref 4.22–5.81)
WBC: 9.2 10*3/uL (ref 4.0–10.5)

## 2012-07-10 LAB — TROPONIN I: Troponin I: <0.3 ng/mL (ref <0.30)

## 2012-07-10 LAB — POCT I-STAT TROPONIN I: Troponin i, poc: 0 ng/mL (ref 0.00–0.08)

## 2012-07-10 MED ORDER — ASPIRIN 81 MG PO CHEW: 324.0000 mg | CHEWABLE_TABLET | ORAL | Status: DC

## 2012-07-10 MED ORDER — NITROGLYCERIN 0.4 MG SL SUBL: 0.4000 mg | SUBLINGUAL_TABLET | SUBLINGUAL | Status: DC | PRN

## 2012-07-10 MED ORDER — HEPARIN BOLUS VIA INFUSION
4000.0000 [IU] | Freq: Once | INTRAVENOUS | Status: AC
  Administered 2012-07-10: 4000 [IU] via INTRAVENOUS

## 2012-07-10 MED ORDER — ATORVASTATIN CALCIUM 20 MG PO TABS
20.0000 mg | ORAL_TABLET | Freq: Every day | ORAL | Status: DC
  Administered 2012-07-10 – 2012-07-11 (×2): 20 mg via ORAL
  Filled 2012-07-10 (×3): qty 1

## 2012-07-10 MED ORDER — SODIUM CHLORIDE 0.9 % IV BOLUS (SEPSIS)
250.0000 mL | Freq: Once | INTRAVENOUS | Status: AC
  Administered 2012-07-10: 250 mL via INTRAVENOUS

## 2012-07-10 MED ORDER — LORATADINE 10 MG PO TABS
10.0000 mg | ORAL_TABLET | Freq: Every day | ORAL | Status: DC
  Administered 2012-07-10 – 2012-07-11 (×2): 10 mg via ORAL
  Filled 2012-07-10 (×3): qty 1

## 2012-07-10 MED ORDER — SODIUM CHLORIDE 0.9 % IJ SOLN
3.0000 mL | Freq: Two times a day (BID) | INTRAMUSCULAR | Status: DC
  Administered 2012-07-10 – 2012-07-11 (×2): 3 mL via INTRAVENOUS

## 2012-07-10 MED ORDER — SODIUM CHLORIDE 0.9 % IV SOLN: INTRAVENOUS | Status: DC

## 2012-07-10 MED ORDER — ASPIRIN EC 325 MG PO TBEC
325.0000 mg | DELAYED_RELEASE_TABLET | Freq: Every day | ORAL | Status: DC
  Administered 2012-07-10: 325 mg via ORAL
  Filled 2012-07-10: qty 1

## 2012-07-10 MED ORDER — ALUM & MAG HYDROXIDE-SIMETH 200-200-20 MG/5ML PO SUSP
15.0000 mL | ORAL | Status: DC | PRN
  Administered 2012-07-10: 15 mL via ORAL
  Filled 2012-07-10: qty 30

## 2012-07-10 MED ORDER — INSULIN ASPART 100 UNIT/ML ~~LOC~~ SOLN
0.0000 [IU] | Freq: Three times a day (TID) | SUBCUTANEOUS | Status: DC
  Administered 2012-07-10 (×2): 3 [IU] via SUBCUTANEOUS
  Administered 2012-07-11: 8 [IU] via SUBCUTANEOUS
  Administered 2012-07-11: 3 [IU] via SUBCUTANEOUS

## 2012-07-10 MED ORDER — HEPARIN (PORCINE) IN NACL 100-0.45 UNIT/ML-% IJ SOLN
1600.0000 [IU]/h | INTRAMUSCULAR | Status: DC
  Administered 2012-07-10: 1400 [IU]/h via INTRAVENOUS
  Administered 2012-07-11: 1600 [IU]/h via INTRAVENOUS
  Filled 2012-07-10 (×4): qty 250

## 2012-07-10 MED ORDER — SODIUM CHLORIDE 0.9 % IV SOLN
INTRAVENOUS | Status: DC
  Administered 2012-07-10 (×2): via INTRAVENOUS

## 2012-07-10 MED ORDER — ACETAMINOPHEN 325 MG PO TABS
650.0000 mg | ORAL_TABLET | ORAL | Status: DC | PRN
  Administered 2012-07-10: 650 mg via ORAL
  Filled 2012-07-10: qty 2

## 2012-07-10 MED ORDER — SODIUM CHLORIDE 0.9 % IJ SOLN: 3.0000 mL | INTRAMUSCULAR | Status: DC | PRN

## 2012-07-10 MED ORDER — SODIUM CHLORIDE 0.9 % IV SOLN: 250.0000 mL | INTRAVENOUS | Status: DC | PRN

## 2012-07-10 MED ORDER — SODIUM CHLORIDE 0.9 % IJ SOLN
3.0000 mL | Freq: Two times a day (BID) | INTRAMUSCULAR | Status: DC
  Administered 2012-07-11: 3 mL via INTRAVENOUS

## 2012-07-10 MED ORDER — METOPROLOL SUCCINATE ER 25 MG PO TB24
25.0000 mg | ORAL_TABLET | Freq: Every day | ORAL | Status: DC
  Filled 2012-07-10 (×3): qty 1

## 2012-07-10 MED ORDER — NITROGLYCERIN 0.4 MG SL SUBL
0.4000 mg | SUBLINGUAL_TABLET | SUBLINGUAL | Status: DC | PRN
  Filled 2012-07-10: qty 25

## 2012-07-10 MED ORDER — ONDANSETRON HCL 4 MG/2ML IJ SOLN: 4.0000 mg | Freq: Four times a day (QID) | INTRAMUSCULAR | Status: DC | PRN

## 2012-07-10 NOTE — Progress Notes (Signed)
ANTICOAGULATION CONSULT NOTE - Initial Consult  Pharmacy Consult for UFH Indication: USAP  No Known Allergies  Patient Measurements: Height: 5' 10.08" (178 cm) Weight: 233 lb 0.4 oz (105.7 kg) IBW/kg (Calculated) : 73.18 Heparin Dosing Weight: 95kg  Vital Signs: Temp: 98 F (36.7 C) (07/06 0641) Temp src: Oral (07/06 0641) BP: 115/59 mmHg (07/06 0745) Pulse Rate: 82 (07/06 0745)  Labs:  Recent Labs  07/10/12 0637 07/10/12 0641 07/10/12 0655  HGB 15.5  --  15.3  HCT 43.7  --  45.0  PLT 219  --   --   CREATININE  --  0.94 0.90    Estimated Creatinine Clearance: 109.1 ml/min (by C-G formula based on Cr of 0.9).   Medical History: Past Medical History  Diagnosis Date  . CAD (coronary artery disease)     a. NSTEMI 10/04 => LHC: mLAD 99%, shelf like RCA 50%, pRCA 25%, dRCA 25%, EF 65% => Taxus DES to mLAD and Taxus DES to dLAD;  b. ETT-Myoview 11/2011: normale, EF 73%, no ischemia  . Dyslipidemia   . HTN (hypertension)   . Obesity   . Diabetes mellitus   . ED (erectile dysfunction)   . Smoker     CIGARS    Medications:   (Not in a hospital admission)  Assessment: 57 y/o male patient admitted with chest pain requiring anticoagulation for r/o MI. Patient with significant cardiac history and previous PCI. EKG wnl, first troponin is negative.  Goal of Therapy:  Heparin level 0.3-0.7 units/ml Monitor platelets by anticoagulation protocol: Yes   Plan:  Heparin 4000 unit IV bolus followed by infusion at 1400 units/hr. Check 6 hour heparin level with daily cbc and heparin level.  Verlene Mayer, PharmD, BCPS Pager [phone] 07/10/2012,8:50 AM

## 2012-07-10 NOTE — ED Provider Notes (Signed)
History    CSN: [id number] Arrival date & time 07/10/12  7829  First MD Initiated Contact with Patient 07/10/12 [phone]     Chief Complaint  Patient presents with  . Chest Pain   (Consider location/radiation/quality/duration/timing/severity/associated sxs/prior Treatment) HPI Comments: Pt is a 57 y/o male with a hx of DM, Htn and CAD s/p stenting X 2 to the LAD in the past - primary cardiologist is Dr. Excell Seltzer.  The pt states that over the week he has been having increased frequency of chest discomfort described as a heaviness on the chest and located in the L chest - intermittent and associated with SOB, diaphoresis, nausea and radiation to the L arm with numbness at the L hand.  This is gradually becoming more and more frequent.  He has taken 325 mg of ASA prior to arrival this AM.  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     He also complains of dizziness described as a spinning sensation when he moves his head side to side, looks down or looks up.  He has no sx of dizziness when he is holding still and resting.  Patient is a 57 y.o. male presenting with chest pain. The history is provided by the patient and medical records.  Chest Pain  Past Medical History  Diagnosis Date  . CAD (coronary artery disease)   . Dyslipidemia   . HTN (hypertension)   . Obesity   . Diabetes mellitus   . ASHD (arteriosclerotic heart disease)     STENT  . ED (erectile dysfunction)   . Smoker     CIGARS   Past Surgical History  Procedure Laterality Date  . Appendectomy    . Right shoulder surgery    . Arthroscopic knee surgery    . Coronary stent placement     No family history on file. History  Substance Use Topics  . Smoking status: Current Some Day Smoker    Types: Cigars  . Smokeless tobacco: Not on file     Comment: smoke 1--d for 25 years; quit 6 months ago. is now smoking ciagrs occasionally   . Alcohol Use: Yes     Comment: rare     Review of  Systems  Cardiovascular: Positive for chest pain.  All other systems reviewed and are negative.    Allergies  Review of patient's allergies indicates no known allergies.  Home Medications   Current Outpatient Rx  Name  Route  Sig  Dispense  Refill  . ACCU-CHEK AVIVA PLUS test strip      USE AS DIRECTED   300 each   1     Dr. Martyn Ehrich Requested Via Fax   . aspirin 325 MG EC tablet   Oral   Take 325 mg by mouth daily.           Marland Kitchen atorvastatin (LIPITOR) 20 MG tablet   Oral   Take 1 tablet (20 mg total) by mouth daily.   90 tablet   3   . Blood Glucose Monitoring Suppl (BLOOD GLUCOSE METER) kit      THIS IS FOR THE ACCU-CHECK VIVA   1 each   0   . enalapril (VASOTEC) 2.5 MG tablet   Oral   Take 1 tablet (2.5 mg total) by mouth daily.   90 tablet   3   . loratadine (CLARITIN) 10 MG tablet   Oral   Take 10 mg by mouth daily.         Marland Kitchen  metFORMIN (GLUCOPHAGE-XR) 750 MG 24 hr tablet   Oral   Take 1 tablet (750 mg total) by mouth 2 (two) times daily at 10 AM and 5 PM.   180 tablet   1   . metoprolol succinate (TOPROL-XL) 25 MG 24 hr tablet   Oral   Take 1 tablet (25 mg total) by mouth daily.   90 tablet   3   . Multiple Vitamin (MULTIVITAMIN) tablet   Oral   Take 1 tablet by mouth daily.            BP 93/65  Pulse 81  Temp(Src) 98 F (36.7 C) (Oral)  Resp 17  SpO2 100% Physical Exam  Nursing note and vitals reviewed. Constitutional: He appears well-developed and well-nourished. No distress.  HENT:  Head: Normocephalic and atraumatic.  Mouth/Throat: Oropharynx is clear and moist. No oropharyngeal exudate.  Eyes: Conjunctivae and EOM are normal. Pupils are equal, round, and reactive to light. Right eye exhibits no discharge. Left eye exhibits no discharge. No scleral icterus.  Neck: Normal range of motion. Neck supple. No JVD present. No thyromegaly present.  Cardiovascular: Normal rate, regular rhythm, normal heart sounds and intact distal  pulses.  Exam reveals no gallop and no friction rub.   No murmur heard. Pulmonary/Chest: Effort normal and breath sounds normal. No respiratory distress. He has no wheezes. He has no rales.  Abdominal: Soft. Bowel sounds are normal. He exhibits no distension and no mass. There is no tenderness.  Musculoskeletal: Normal range of motion. He exhibits no edema and no tenderness.  Lymphadenopathy:    He has no cervical adenopathy.  Neurological: He is alert. Coordination normal.  Skin: Skin is warm and dry. No rash noted. No erythema.  Psychiatric: He has a normal mood and affect. His behavior is normal.    ED Course  Procedures (including critical care time) Labs Reviewed  POCT I-STAT, CHEM 8 - Abnormal; Notable for the following:    Glucose, Bld 148 (*)    All other components within normal limits  CBC  PRO B NATRIURETIC PEPTIDE  BASIC METABOLIC PANEL  POCT I-STAT TROPONIN I   No results found.]  1. Unstable angina     MDM  The pt has no focal abnormalities on his exam and has no acute ischemia on the ECG - he has a hx of CAD and sx concerning for unstable angina - will get troponin, start heparin.  His dizziness is replicated with moving head from side to side and up and down but no nystagmus and this sensation fatigues.  Likely a benign peripheral vertigo and seems to be separate in time and space with the chest discomfort.  ED ECG REPORT  I personally interpreted this EKG   Date: 07/10/2012   Rate: 87  Rhythm: normal sinus rhythm  QRS Axis: normal  Intervals: normal  ST/T Wave abnormalities: normal  Conduction Disutrbances:none  Narrative Interpretation:   Old EKG Reviewed: c/w 10/28/11, no sig changes.  CXR neg for acute findings, labs unremarkable  Trop normal - labs otherwise normal - d/w Dr. Patty Sermons who will see for admission.  Vida Roller, MD 07/10/12 [phone]

## 2012-07-10 NOTE — Progress Notes (Signed)
This encounter was created in error - please disregard.

## 2012-07-10 NOTE — H&P (Signed)
History and Physical   Patient ID: Jerome Rodriguez, MRN: [id number], DOB: 1955/10/04 57 y.o. Date of Encounter: 07/10/2012, 8:42 AM  Primary Physician: Carollee Herter, MD Cardiologist: Dr. Tonny Bollman   Chief Complaint:  Chest Pain  History of Present Illness: Jerome Rodriguez is a 57 y.o. male with a hx of CAD, s/p Taxus DES to the mid LAD and Taxus DES to the distal LAD in 10/2002 in the setting of a NSTEMI, DM2, HTN, HL, tobacco abuse.  He was seen by Dr. Tonny Bollman in 10/2011 and noted chest discomfort.  Myoview 11/2011 was normal.  He has done well until the last 10-14 days.  He notes left sided chest pressure.  This can occur at rest.  It is not necessarily brought on by exertion.  He notes posterior neck discomfort assoc with this as well as numbness in his left hand fingertips.  He describes assoc dyspnea, nausea, diaphoresis.  No syncope.  He has noted dizziness for over a month now.  He describes it as a spinning sensation and it seems to possibly be related to changes in head position.  He denies orthopnea, PND or edema.  He has not tried NTG for his pain.  He does note that belching makes it better for a short period of time.  His discomfort comes and goes and may last for an hour at a time.  His symptoms worsened over the past 2 days.  They were particularly worse yesterday and still present this AM.  He had difficultly sleeping last night and decided to come to the ED today.  He does note that his symptoms are reminiscent of his prior angina in 2004.  His BP is s/w marginal (89-95 systolic) and he has not been given NTG.  ECG is non-acute.  Initial troponin is negative.  CXR demonstrates non-specific pattern of diffuse interstitial prominence with scattered areas of reticulonodular opacities (underlying chronic bronchitic changes vs atypical infection vs developing interstitial lung disease).  High resolution CT without contrast can be considered.    Past Medical  History  Diagnosis Date  . CAD (coronary artery disease)     a. NSTEMI 10/04 => LHC: mLAD 99%, shelf like RCA 50%, pRCA 25%, dRCA 25%, EF 65% => Taxus DES to mLAD and Taxus DES to dLAD;  b. ETT-Myoview 11/2011: normale, EF 73%, no ischemia  . Dyslipidemia   . HTN (hypertension)   . Obesity   . Diabetes mellitus   . ED (erectile dysfunction)   . Smoker     CIGARS     Past Surgical History  Procedure Laterality Date  . Appendectomy    . Right shoulder surgery    . Arthroscopic knee surgery    . Coronary stent placement        Current Facility-Administered Medications  Medication Dose Route Frequency Provider Last Rate Last Dose  . nitroGLYCERIN (NITROSTAT) SL tablet 0.4 mg  0.4 mg Sublingual Q5 min PRN Vida Roller, MD       Current Outpatient Prescriptions  Medication Sig Dispense Refill  . ACCU-CHEK AVIVA PLUS test strip USE AS DIRECTED  300 each  1  . aspirin 325 MG EC tablet Take 325 mg by mouth daily.        Marland Kitchen atorvastatin (LIPITOR) 20 MG tablet Take 1 tablet (20 mg total) by mouth daily.  90 tablet  3  . Blood Glucose Monitoring Suppl (BLOOD GLUCOSE METER) kit THIS IS FOR THE ACCU-CHECK VIVA  1 each  0  . enalapril (VASOTEC) 2.5 MG tablet Take 1 tablet (2.5 mg total) by mouth daily.  90 tablet  3  . loratadine (CLARITIN) 10 MG tablet Take 10 mg by mouth daily.      . metFORMIN (GLUCOPHAGE-XR) 750 MG 24 hr tablet Take 1 tablet (750 mg total) by mouth 2 (two) times daily at 10 AM and 5 PM.  180 tablet  1  . metoprolol succinate (TOPROL-XL) 25 MG 24 hr tablet Take 1 tablet (25 mg total) by mouth daily.  90 tablet  3  . Multiple Vitamin (MULTIVITAMIN) tablet Take 1 tablet by mouth daily.           Allergies: No Known Allergies   Social History:  The patient  reports that he has been smoking Cigars.  He does not have any smokeless tobacco history on file. He reports that he does not drink alcohol or use illicit drugs.   Family History:  The patient's family history  includes Heart attack in his mother.   ROS:  Please see the history of present illness.  Patient has lost 25 lbs over the last 1 year.   All other systems reviewed and negative.   Vital Signs: Blood pressure 115/59, pulse 82, temperature 98 F (36.7 C), temperature source Oral, resp. rate 17, SpO2 95.00%.  PHYSICAL EXAM: General:  Well nourished, well developed, in no acute distress HEENT: normal Lymph: no adenopathy Neck: no JVD Endocrine:  No thryomegaly Vascular: No carotid bruits; DP/PT 2+ bilat Cardiac:  normal S1, S2; RRR; no murmur Lungs:  clear to auscultation bilaterally, no wheezing, rhonchi or rales Abd: soft, nontender, no hepatomegaly Ext: no edema Musculoskeletal:  No deformities  Skin: warm and dry Neuro:  CNs 2-12 intact, no focal abnormalities noted Psych:  Normal affect   EKG:  NSR, HR 87, normal axis, NSSTTW changes  Labs:   Lab Results  Component Value Date   WBC 9.2 07/10/2012   HGB 15.3 07/10/2012   HCT 45.0 07/10/2012   MCV 84.2 07/10/2012   PLT 219 07/10/2012      Recent Labs Lab 07/10/12 0641 07/10/12 0655  NA 135 138  K 3.7 3.8  CL 98 100  CO2 26  --   BUN 16 16  CREATININE 0.94 0.90  CALCIUM 9.2  --   GLUCOSE 153* 148*    No results found for this basename: CKTOTAL, CKMB, TROPONINI,  in the last 72 hours  POC Troponin:  0.00  Radiology/Studies:   Dg Chest 2 View  07/10/2012     IMPRESSION:  Nonspecific pattern of diffuse interstitial prominence with scattered areas of reticulonodular opacities.  Findings may represent progression of underlying chronic bronchitic changes. Alternately, an atypical infectious process or developing interstitial lung disease are possibilities in the appropriate clinical setting.  If additional imaging is clinically warranted, recommend high- resolution chest CT without contrast.   Original Report Authenticated By: Malachy Moan, M.D.     ASSESSMENT AND PLAN:   1. Chest Pain:  He has typical and atypical  symptoms.  However, he reports that his symptoms are similar to what he experienced in 2004 with his NSTEMI.  He continues to smoke and has diabetes.  He had a recent low risk myoview.  In light of recent ischemic evaluation in addition to his symptoms, he will likely need cardiac cath to further define his anatomy and assess for stent patency.   Admit to observation in step down unit. Cycle CEs.  Continue ASA, beta  blocker, statin.  Start IV heparin.  Hold off on NTG given relative hypotension.   2. Abnormal CXR:  He does not have a cough.  He is afebrile and has a normal white count.  Doubt infectious process.  He continues to smoke.  He has also noted unintended weight loss over the last year.  Will get high resolution Chest CT without contrast to further investigate CXR findings.   3. Hypertension:  BPs running low here.  Hold ACE for now.  Will try to continue beta blocker.   4. Diabetes Mellitus:  Continue current Rx. Cover with SSI.   5. Hyperlipidemia:  Continue statin. 6. Tobacco Abuse:  Cessation has been recommended to him in the past. 7. Disposition:  Patient will also be seen by Dr. Cassell Clement today.   Signed,  Tereso Newcomer, PA-C 07/10/2012 8:42 AM  Patient seen in ER. Chest pain has atypical features but patient states pain is similar to what he had in 2004 prior to his stents. EKG in ER is normal. Chest xray reviewed and shows increased interstitial prominence. Will get high-resolution CT scan to evaluate further. To evaluate chest pain will plan cath in am. IV heparin ordered. Exam unremarkable except for few inspiratory rhonchi. Agree with assessment and plan as noted above.

## 2012-07-10 NOTE — Progress Notes (Signed)
ANTICOAGULATION CONSULT NOTE - Follow Up Consult  Pharmacy Consult for heparin Indication: USAP  Labs:  Recent Labs  07/10/12 0637 07/10/12 0641 07/10/12 0655 07/10/12 0909 07/10/12 1100 07/10/12 1501 07/10/12 1646 07/10/12 2216  HGB 15.5  --  15.3  --   --   --   --   --   HCT 43.7  --  45.0  --   --   --   --   --   PLT 219  --   --   --   --   --   --   --   APTT  --   --   --  29  --   --   --   --   LABPROT  --   --   --  13.8  --   --   --   --   INR  --   --   --  1.08  --   --   --   --   HEPARINUNFRC  --   --   --   --   --  0.31  --  0.24*  CREATININE  --  0.94 0.90  --   --  0.91  --   --   TROPONINI  --   --   --   --  <0.30  --  <0.30 <0.30    Assessment: 56yo male now subtherapeutic on heparin after one level at low end of goal.  Goal of Therapy:  Heparin level 0.3-0.7 units/ml   Plan:  Will increase heparin gtt by 2 units/kg/hr to 1600 units/hr and check level with am labs.  Vernard Gambles, PharmD, BCPS  07/10/2012,11:35 PM

## 2012-07-10 NOTE — Progress Notes (Signed)
ANTICOAGULATION CONSULT NOTE - Follow Up Consult  Pharmacy Consult for heparin Indication: USAP  No Known Allergies  Patient Measurements: Height: 5' 10.08" (178 cm) Weight: 233 lb 0.4 oz (105.7 kg) IBW/kg (Calculated) : 73.18 Heparin Dosing Weight: 95 kg  Vital Signs: Temp: 97.4 F (36.3 C) (07/06 1152) Temp src: Oral (07/06 1152) BP: 81/47 mmHg (07/06 1301) Pulse Rate: 71 (07/06 1301)  Labs:  Recent Labs  07/10/12 0637 07/10/12 0641 07/10/12 0655 07/10/12 0909 07/10/12 1100 07/10/12 1501  HGB 15.5  --  15.3  --   --   --   HCT 43.7  --  45.0  --   --   --   PLT 219  --   --   --   --   --   APTT  --   --   --  29  --   --   LABPROT  --   --   --  13.8  --   --   INR  --   --   --  1.08  --   --   HEPARINUNFRC  --   --   --   --   --  0.31  CREATININE  --  0.94 0.90  --   --  0.91  TROPONINI  --   --   --   --  <0.30  --     Estimated Creatinine Clearance: 107.9 ml/min (by C-G formula based on Cr of 0.91).   Medications:  Scheduled:  . aspirin  324 mg Oral NOW  . aspirin  325 mg Oral Daily  . atorvastatin  20 mg Oral Daily  . insulin aspart  0-15 Units Subcutaneous TID WC  . loratadine  10 mg Oral Daily  . metoprolol succinate  25 mg Oral Daily  . sodium chloride  3 mL Intravenous Q12H  . sodium chloride  3 mL Intravenous Q12H   Infusions:  . [START ON 07/11/2012] sodium chloride    . sodium chloride 75 mL/hr at 07/10/12 1400  . heparin 1,400 Units/hr (07/10/12 1400)    Assessment: 57 yo male with USAP is currently on therapeutic heparin.  Heparin level was 0.31 Goal of Therapy:  Heparin level 0.3-0.7 units/ml Monitor platelets by anticoagulation protocol: Yes   Plan:  1) Continue heparin at 1400 units/hr and recheck heparin level at 2100 to reconfirm dosing.  [name], [name] 07/10/2012,3:52 PM

## 2012-07-10 NOTE — Progress Notes (Signed)
Pt b/p dropped down to 81/47, pt asymptomatic. MD notified new order received. Will continue to monitor.

## 2012-07-10 NOTE — ED Notes (Signed)
Scott, pa at bedside to eval pt

## 2012-07-10 NOTE — ED Notes (Signed)
Patient transported to CT 

## 2012-07-10 NOTE — ED Notes (Signed)
PT. REPORTS INTERMITTENT LEFT CHEST PAIN / CHEST PRESSURE  WITH SOB , DIZZINESS AND LEFT HAND NUMBNESS ONSET 1 WEEK AGO . PT. TOOK 1 TAB ASA 325 MG AT 2AM THIS MORNING . PT. HAS A HISTORY OF CORONARY STENTS - HIS CARDIOLOGIST IS DR. Excell Seltzer.

## 2012-07-11 ENCOUNTER — Encounter (HOSPITAL_COMMUNITY)
Admission: EM | Disposition: A | Discharge status: Home / Self Care | Payer: Self-pay | Source: Home / Self Care | Attending: Cardiology

## 2012-07-11 DIAGNOSIS — I2510 Atherosclerotic heart disease of native coronary artery without angina pectoris: Secondary | ICD-10-CM

## 2012-07-11 HISTORY — PX: LEFT HEART CATHETERIZATION WITH CORONARY ANGIOGRAM: SHX5451

## 2012-07-11 LAB — CBC
MCH: 29.4 pg (ref 26.0–34.0)
MCHC: 34.7 g/dL (ref 30.0–36.0)
Platelets: 178 10*3/uL (ref 150–400)
RBC: 4.77 MIL/uL (ref 4.22–5.81)

## 2012-07-11 LAB — LIPID PANEL
LDL Cholesterol: 78 mg/dL (ref 0–99)
Triglycerides: 214 mg/dL — ABNORMAL HIGH (ref <150)
VLDL: 43 mg/dL — ABNORMAL HIGH (ref 0–40)

## 2012-07-11 LAB — GLUCOSE, CAPILLARY
Glucose-Capillary: 155 mg/dL — ABNORMAL HIGH (ref 70–99)
Glucose-Capillary: 145 mg/dL — ABNORMAL HIGH (ref 70–99)
Glucose-Capillary: 297 mg/dL — ABNORMAL HIGH (ref 70–99)

## 2012-07-11 LAB — POCT ACTIVATED CLOTTING TIME: Activated Clotting Time: 493 seconds

## 2012-07-11 SURGERY — LEFT HEART CATHETERIZATION WITH CORONARY ANGIOGRAM
Anesthesia: LOCAL

## 2012-07-11 MED ORDER — PRASUGREL HCL 10 MG PO TABS
10.0000 mg | ORAL_TABLET | Freq: Every day | ORAL | Status: DC
  Administered 2012-07-11: 10 mg via ORAL
  Filled 2012-07-11 (×2): qty 1

## 2012-07-11 MED ORDER — BIVALIRUDIN 250 MG IV SOLR
INTRAVENOUS | Status: AC
  Filled 2012-07-11: qty 250

## 2012-07-11 MED ORDER — NITROGLYCERIN 0.2 MG/ML ON CALL CATH LAB
INTRAVENOUS | Status: AC
  Filled 2012-07-11: qty 1

## 2012-07-11 MED ORDER — MIDAZOLAM HCL 2 MG/2ML IJ SOLN
INTRAMUSCULAR | Status: AC
  Filled 2012-07-11: qty 2

## 2012-07-11 MED ORDER — HEPARIN SODIUM (PORCINE) 1000 UNIT/ML IJ SOLN
INTRAMUSCULAR | Status: AC
  Filled 2012-07-11: qty 2

## 2012-07-11 MED ORDER — FENTANYL CITRATE 0.05 MG/ML IJ SOLN
INTRAMUSCULAR | Status: AC
  Filled 2012-07-11: qty 2

## 2012-07-11 MED ORDER — SODIUM CHLORIDE 0.9 % IJ SOLN: 3.0000 mL | Freq: Two times a day (BID) | INTRAMUSCULAR | Status: DC

## 2012-07-11 MED ORDER — HEPARIN (PORCINE) IN NACL 2-0.9 UNIT/ML-% IJ SOLN
INTRAMUSCULAR | Status: AC
  Filled 2012-07-11: qty 1000

## 2012-07-11 MED ORDER — PRASUGREL HCL 10 MG PO TABS
ORAL_TABLET | ORAL | Status: AC
  Filled 2012-07-11: qty 6

## 2012-07-11 MED ORDER — SODIUM CHLORIDE 0.9 % IV SOLN: 250.0000 mL | INTRAVENOUS | Status: DC | PRN

## 2012-07-11 MED ORDER — SODIUM CHLORIDE 0.9 % IV SOLN
1.0000 mL/kg/h | INTRAVENOUS | Status: AC
  Administered 2012-07-11: 1 mL/kg/h via INTRAVENOUS

## 2012-07-11 MED ORDER — ASPIRIN 81 MG PO CHEW
81.0000 mg | CHEWABLE_TABLET | Freq: Every day | ORAL | Status: DC
  Filled 2012-07-11: qty 1

## 2012-07-11 MED ORDER — SODIUM CHLORIDE 0.9 % IJ SOLN: 3.0000 mL | INTRAMUSCULAR | Status: DC | PRN

## 2012-07-11 MED ORDER — LIDOCAINE HCL (PF) 1 % IJ SOLN
INTRAMUSCULAR | Status: AC
  Filled 2012-07-11: qty 30

## 2012-07-11 MED ORDER — VERAPAMIL HCL 2.5 MG/ML IV SOLN
INTRAVENOUS | Status: AC
  Filled 2012-07-11: qty 2

## 2012-07-11 MED ORDER — ASPIRIN 81 MG PO CHEW: 324.0000 mg | CHEWABLE_TABLET | ORAL | Status: DC

## 2012-07-11 NOTE — H&P (View-Only) (Signed)
TELEMETRY: Reviewed telemetry pt in NSR  Filed Vitals:   07/11/12 0500 07/11/12 0600 07/11/12 0752 07/11/12 1143  BP: 109/61 105/66 105/58   Pulse: 64 67 72   Temp:   97.8 F (36.6 C) 97.7 F (36.5 C)  TempSrc:   Oral Oral  Resp: 13 11 10    Height:      Weight:      SpO2: 94% 96% 97%     Intake/Output Summary (Last 24 hours) at 07/11/12 1209 Last data filed at 07/11/12 0900  Gross per 24 hour  Intake   1893 ml  Output   1825 ml  Net     68 ml    SUBJECTIVE No further chest pain. Denies cough or SOB.  LABS: Basic Metabolic Panel:  Recent Labs  40/98/11 0641 07/10/12 0655 07/10/12 1501  NA 135 138 138  K 3.7 3.8 4.0  CL 98 100 101  CO2 26  --  31  GLUCOSE 153* 148* 218*  BUN 16 16 13   CREATININE 0.94 0.90 0.91  CALCIUM 9.2  --  8.5   Liver Function Tests:  Recent Labs  07/10/12 1501  AST 13  ALT 14  ALKPHOS 120*  BILITOT 0.5  PROT 6.1  ALBUMIN 3.3*   No results found for this basename: LIPASE, AMYLASE,  in the last 72 hours CBC:  Recent Labs  07/10/12 0637 07/10/12 0655 07/11/12 0404  WBC 9.2  --  6.4  HGB 15.5 15.3 14.0  HCT 43.7 45.0 40.3  MCV 84.2  --  84.5  PLT 219  --  178   Cardiac Enzymes:  Recent Labs  07/10/12 1100 07/10/12 1646 07/10/12 2216  TROPONINI <0.30 <0.30 <0.30   Hemoglobin A1C:  Recent Labs  07/10/12 0909  HGBA1C 7.4*   Fasting Lipid Panel:  Recent Labs  07/11/12 0404  CHOL 150  HDL 29*  LDLCALC 78  TRIG 914*  CHOLHDL 5.2   Thyroid Function Tests:  Recent Labs  07/10/12 0909  TSH 10.120*    Radiology/Studies:  Dg Chest 2 View  07/10/2012   *RADIOLOGY REPORT*  Clinical Data: Intermittent progressive chest pain  CHEST - 2 VIEW  Comparison: Prior chest x-ray 10/07/2005  Findings: Lungs are well-aerated without focal consolidation, pleural effusion, pneumothorax or pulmonary edema.  Cardiac and mediastinal contours are within normal limits.  Slight interval progression of the diffuse mild  interstitial prominence with scattered areas of reticulonodular opacities.  No acute osseous abnormality.  IMPRESSION:  Nonspecific pattern of diffuse interstitial prominence with scattered areas of reticulonodular opacities.  Findings may represent progression of underlying chronic bronchitic changes. Alternately, an atypical infectious process or developing interstitial lung disease are possibilities in the appropriate clinical setting.  If additional imaging is clinically warranted, recommend high- resolution chest CT without contrast.   Original Report Authenticated By: Malachy Moan, M.D.   Ct Chest Wo Contrast  07/10/2012   *RADIOLOGY REPORT*  Clinical Data: Chest pain, abnormal chest x-ray  CT CHEST WITHOUT CONTRAST  Technique:  Multidetector CT imaging of the chest was performed following the standard protocol without IV contrast.  Comparison: Chest x-ray obtained earlier today at 07:13 a.m.  Findings:  Mediastinum: Unremarkable CT appearance of the thyroid gland.  No suspicious mediastinal or hilar adenopathy.  No soft tissue mediastinal mass.  The thoracic esophagus is unremarkable.  Heart/Vascular: Limited evaluation in the absence of intravenous contrast material.  Extensive atherosclerotic vascular calcifications involving the left main, left anterior descending, circumflex and right coronary arteries.  Trace pericardial effusion.  The heart is within normal limits for size.  No aneurysmal dilatation of the thoracic aorta.  The pulmonary arteries within normal limits for size.  Lungs/Pleura: Numerous scattered bilateral noncalcified pulmonary nodules ranging in size from 2 - 3 mm to a maximum of 8 mm in the left lower lobe (image 38 of series 6). Small isolated pulmonary cyst in the left lower lobe. Mild lower lobe central airway thickening.  Trace dependent atelectasis.  Upper Abdomen: Unremarkable visualized upper abdomen.  Bones: No acute fracture or aggressive appearing lytic or blastic osseous  lesion.  IMPRESSION:  1.  Numerous scattered bilateral pulmonary nodules ranging in size from a few millimeters to a maximum of 8 mm in the left lower lobe. In the absence of a known primary malignancy, these likely represent sequelae of an old granulomatous process such as histoplasmosis or other atypical infectious process.  If the patient has a known malignancy, then hematogenous pulmonary metastases are a possibility.  Recommend repeat CT scan of the chest in 3- 6 months to evaluate for stability.  2.  Mild paraseptal emphysema with a few scattered centrilobular emphysematous blebs.  3.  Mild bronchial wall thickening.  4.  Atherosclerosis including extensive multivessel coronary artery disease  5.  Trace pericardial effusion.   Original Report Authenticated By: Malachy Moan, M.D.    PHYSICAL EXAM General: Well developed, well nourished, in no acute distress. Head: Normocephalic, atraumatic, sclera non-icteric, no xanthomas, nares are without discharge. Neck: Negative for carotid bruits. JVD not elevated. Lungs: Clear bilaterally to auscultation without wheezes, rales, or rhonchi. Breathing is unlabored. Heart: RRR S1 S2 without murmurs, rubs, or gallops.  Abdomen: Soft, non-tender, non-distended with normoactive bowel sounds. No hepatomegaly. No rebound/guarding. No obvious abdominal masses. Msk:  Strength and tone appears normal for age. Extremities: No clubbing, cyanosis or edema.  Distal pedal pulses are 2+ and equal bilaterally. Neuro: Alert and oriented X 3. Moves all extremities spontaneously. Psych:  Responds to questions appropriately with a normal affect.  ASSESSMENT AND PLAN: 1. Chest pain. Patient has ruled out for MI. Ecg non acute. Recent myoview low risk. Will proceed with cardiac cath today. 2. Pulmonary nodules. No history of malignancy. Pending cardiac workup may consider pulmonary evaluation. 3. HTN 4. Diabetes mellitus. 5. HL  Principal Problem:   Chest pain,  unspecified Active Problems:   HYPERLIPIDEMIA-MIXED   HYPERTENSION, BENIGN   CAD, NATIVE VESSEL   Diabetes mellitus   Tobacco abuse   Abnormal chest x-ray    Signed, Peter [place] MD,FACC 07/11/2012 12:12 PM

## 2012-07-11 NOTE — Progress Notes (Signed)
Utilization review completed.  

## 2012-07-11 NOTE — Progress Notes (Signed)
ANTICOAGULATION CONSULT NOTE - Follow Up Consult  Pharmacy Consult for : Heparin Indication:  Unstable Angina  Dosing Weight: 95 kg  Labs:  Recent Labs  07/10/12 0909 07/10/12 1501 07/10/12 2216 07/11/12 0404  HGB  --   --   --  14.0  HCT  --   --   --  40.3  PLT  --   --   --  178  APTT 29  --   --   --   LABPROT 13.8  --   --   --   INR 1.08  --   --   --   HEPARINUNFRC  --  0.31 0.24* 0.41  CREATININE  --  0.91  --   --      Infusions:  . sodium chloride    . sodium chloride 75 mL/hr at 07/10/12 2159  . heparin 1,600 Units/hr (07/11/12 0900)    Assessment:  57 y/o male on Heparin infusion for unstable angina pain.  Heparin infusing at 1600 units/hr.  Heparin level within therapeutic window 0.41 units/ml.  No bleeding complications noted   Goal of Therapy:  Heparin level 0.3-0.7 units/ml   Plan:  Continue Heparin infusion at the current rate.   Follow up after cardiac cath.   Jerome Rodriguez, [name] 07/11/2012, 1:59 PM

## 2012-07-11 NOTE — Interval H&P Note (Signed)
History and Physical Interval Note:  07/11/2012 4:06 PM  Jerome Rodriguez  has presented today for surgery, with the diagnosis of cp  The various methods of treatment have been discussed with the patient and family. After consideration of risks, benefits and other options for treatment, the patient has consented to  Procedure(s): LEFT HEART CATHETERIZATION WITH CORONARY ANGIOGRAM (N/A) as a surgical intervention .  The patient's history has been reviewed, patient examined, no change in status, stable for surgery.  I have reviewed the patient's chart and labs.  Questions were answered to the patient's satisfaction.     Theron Arista [facility] 07/11/2012 4:07 PM Cath Lab Visit (complete for each Cath Lab visit)  Clinical Evaluation Leading to the Procedure:   ACS: yes  Non-ACS:    Anginal Classification: CCS III  Anti-ischemic medical therapy: Minimal Therapy (1 class of medications)  Non-Invasive Test Results: No non-invasive testing performed  Prior CABG: No previous CABG

## 2012-07-11 NOTE — Progress Notes (Signed)
 TELEMETRY: Reviewed telemetry pt in NSR  Filed Vitals:   07/11/12 0500 07/11/12 0600 07/11/12 0752 07/11/12 1143  BP: 109/61 105/66 105/58   Pulse: 64 67 72   Temp:   97.8 F (36.6 C) 97.7 F (36.5 C)  TempSrc:   Oral Oral  Resp: 13 11 10   Height:      Weight:      SpO2: 94% 96% 97%     Intake/Output Summary (Last 24 hours) at 07/11/12 1209 Last data filed at 07/11/12 0900  Gross per 24 hour  Intake   1893 ml  Output   1825 ml  Net     68 ml    SUBJECTIVE No further chest pain. Denies cough or SOB.  LABS: Basic Metabolic Panel:  Recent Labs  07/10/12 0641 07/10/12 0655 07/10/12 1501  NA 135 138 138  K 3.7 3.8 4.0  CL 98 100 101  CO2 26  --  31  GLUCOSE 153* 148* 218*  BUN 16 16 13  CREATININE 0.94 0.90 0.91  CALCIUM 9.2  --  8.5   Liver Function Tests:  Recent Labs  07/10/12 1501  AST 13  ALT 14  ALKPHOS 120*  BILITOT 0.5  PROT 6.1  ALBUMIN 3.3*   No results found for this basename: LIPASE, AMYLASE,  in the last 72 hours CBC:  Recent Labs  07/10/12 0637 07/10/12 0655 07/11/12 0404  WBC 9.2  --  6.4  HGB 15.5 15.3 14.0  HCT 43.7 45.0 40.3  MCV 84.2  --  84.5  PLT 219  --  178   Cardiac Enzymes:  Recent Labs  07/10/12 1100 07/10/12 1646 07/10/12 2216  TROPONINI <0.30 <0.30 <0.30   Hemoglobin A1C:  Recent Labs  07/10/12 0909  HGBA1C 7.4*   Fasting Lipid Panel:  Recent Labs  07/11/12 0404  CHOL 150  HDL 29*  LDLCALC 78  TRIG 214*  CHOLHDL 5.2   Thyroid Function Tests:  Recent Labs  07/10/12 0909  TSH 10.120*    Radiology/Studies:  Dg Chest 2 View  07/10/2012   *RADIOLOGY REPORT*  Clinical Data: Intermittent progressive chest pain  CHEST - 2 VIEW  Comparison: Prior chest x-ray 10/07/2005  Findings: Lungs are well-aerated without focal consolidation, pleural effusion, pneumothorax or pulmonary edema.  Cardiac and mediastinal contours are within normal limits.  Slight interval progression of the diffuse mild  interstitial prominence with scattered areas of reticulonodular opacities.  No acute osseous abnormality.  IMPRESSION:  Nonspecific pattern of diffuse interstitial prominence with scattered areas of reticulonodular opacities.  Findings may represent progression of underlying chronic bronchitic changes. Alternately, an atypical infectious process or developing interstitial lung disease are possibilities in the appropriate clinical setting.  If additional imaging is clinically warranted, recommend high- resolution chest CT without contrast.   Original Report Authenticated By: Heath McCullough, M.D.   Ct Chest Wo Contrast  07/10/2012   *RADIOLOGY REPORT*  Clinical Data: Chest pain, abnormal chest x-ray  CT CHEST WITHOUT CONTRAST  Technique:  Multidetector CT imaging of the chest was performed following the standard protocol without IV contrast.  Comparison: Chest x-ray obtained earlier today at 07:13 a.m.  Findings:  Mediastinum: Unremarkable CT appearance of the thyroid gland.  No suspicious mediastinal or hilar adenopathy.  No soft tissue mediastinal mass.  The thoracic esophagus is unremarkable.  Heart/Vascular: Limited evaluation in the absence of intravenous contrast material.  Extensive atherosclerotic vascular calcifications involving the left main, left anterior descending, circumflex and right coronary arteries.    Trace pericardial effusion.  The heart is within normal limits for size.  No aneurysmal dilatation of the thoracic aorta.  The pulmonary arteries within normal limits for size.  Lungs/Pleura: Numerous scattered bilateral noncalcified pulmonary nodules ranging in size from 2 - 3 mm to a maximum of 8 mm in the left lower lobe (image 38 of series 6). Small isolated pulmonary cyst in the left lower lobe. Mild lower lobe central airway thickening.  Trace dependent atelectasis.  Upper Abdomen: Unremarkable visualized upper abdomen.  Bones: No acute fracture or aggressive appearing lytic or blastic osseous  lesion.  IMPRESSION:  1.  Numerous scattered bilateral pulmonary nodules ranging in size from a few millimeters to a maximum of 8 mm in the left lower lobe. In the absence of a known primary malignancy, these likely represent sequelae of an old granulomatous process such as histoplasmosis or other atypical infectious process.  If the patient has a known malignancy, then hematogenous pulmonary metastases are a possibility.  Recommend repeat CT scan of the chest in 3- 6 months to evaluate for stability.  2.  Mild paraseptal emphysema with a few scattered centrilobular emphysematous blebs.  3.  Mild bronchial wall thickening.  4.  Atherosclerosis including extensive multivessel coronary artery disease  5.  Trace pericardial effusion.   Original Report Authenticated By: Heath McCullough, M.D.    PHYSICAL EXAM General: Well developed, well nourished, in no acute distress. Head: Normocephalic, atraumatic, sclera non-icteric, no xanthomas, nares are without discharge. Neck: Negative for carotid bruits. JVD not elevated. Lungs: Clear bilaterally to auscultation without wheezes, rales, or rhonchi. Breathing is unlabored. Heart: RRR S1 S2 without murmurs, rubs, or gallops.  Abdomen: Soft, non-tender, non-distended with normoactive bowel sounds. No hepatomegaly. No rebound/guarding. No obvious abdominal masses. Msk:  Strength and tone appears normal for age. Extremities: No clubbing, cyanosis or edema.  Distal pedal pulses are 2+ and equal bilaterally. Neuro: Alert and oriented X 3. Moves all extremities spontaneously. Psych:  Responds to questions appropriately with a normal affect.  ASSESSMENT AND PLAN: 1. Chest pain. Patient has ruled out for MI. Ecg non acute. Recent myoview low risk. Will proceed with cardiac cath today. 2. Pulmonary nodules. No history of malignancy. Pending cardiac workup may consider pulmonary evaluation. 3. HTN 4. Diabetes mellitus. 5. HL  Principal Problem:   Chest pain,  unspecified Active Problems:   HYPERLIPIDEMIA-MIXED   HYPERTENSION, BENIGN   CAD, NATIVE VESSEL   Diabetes mellitus   Tobacco abuse   Abnormal chest x-ray    Signed, Peter Jordan MD,FACC 07/11/2012 12:12 PM    

## 2012-07-11 NOTE — CV Procedure (Signed)
Cardiac Catheterization Procedure Note  Name: Jerome Rodriguez MRN: [id number] DOB: 1955-12-21  Procedure: Left Heart Cath, Selective Coronary Angiography, LV angiography, PTCA and stenting of the mid to distal LAD  Indication: 57 year old male with history of coronary disease status post stenting of the mid and distal LAD in 2004 with Taxus stents. He presents now with unstable angina. Angina is class III. He has been on 1 antianginal medication.  TIMI score is high.  Procedural Details:  The right wrist was prepped, draped, and anesthetized with 1% lidocaine. Using the modified Seldinger technique, a 6 French sheath was introduced into the right radial artery. 3 mg of verapamil was administered through the sheath, weight-based unfractionated heparin was administered intravenously. Standard Judkins catheters were used for selective coronary angiography and left ventriculography. Catheter exchanges were performed over an exchange length guidewire.  PROCEDURAL FINDINGS Hemodynamics: AO 111/71 with a mean of 89 mmHg LV 110/15 mmHg   Coronary angiography: Coronary dominance: right  Left mainstem: The left main has mild irregularities less than 10%.  Left anterior descending (LAD): The left anterior descending artery is moderately calcified in the proximal vessel. In the mid vessel at the site of a previous stent there is a 90% in-stent restenosis. This lesion is at the takeoff of the second diagonal. Further distally there is a second stent with a 99% stenosis within the stent. The first diagonal is without significant disease. There is a 60-70% stenosis at the takeoff of the second diagonal.  Left circumflex (LCx): The left circumflex is a large vessel which gives rise to 2 small marginal branches and then terminates in a large posterior lateral branch. In the mid vessel there is diffuse disease up to 20%. The distal vessel has 30-40% disease.  Right coronary artery (RCA): The right  coronary is a codominant vessel. There is segmental 50-60% plaque in the proximal vessel there is 30% disease at the crux.  Left ventriculography: Left ventricular systolic function is normal, LVEF is estimated at 55-65%, there is no significant mitral regurgitation   PCI Note:  Following the diagnostic procedure, the decision was made to proceed with PCI of the LAD. Effient 60 mg was given orally. Weight-based bivalirudin was given for anticoagulation. Once a therapeutic ACT was achieved, a 6 [place]  XB LAD 3.5 guide catheter was inserted.  A A pro-water coronary guidewire was used to cross the lesion.  The lesion was predilated with a  2.5 mm balloon.   there was still significant waisting of the balloon in the mid vessel. We predilated with a 3.0 mm noncompliant balloon to make sure that this lesion would yield. The lesion was then stented with a  3.0 x 38 mm Promus stent.  The stent was postdilated with a  3.25 mm noncompliant balloon.  The more proximal margin of the stent prior to the second diagonal was postdilated with a 4.0 mm noncompliant balloon.  Following PCI, there was 0% residual stenosis and TIMI-3 flow.  There was some plaque shift into the ostium of the second diagonal but there was still excellent TIMI-3 flow in this vessel. The patient was pain free. Final angiography confirmed an excellent result. The patient tolerated the procedure well. There were no immediate procedural complications. A TR band was used for radial hemostasis. The patient was transferred to the post catheterization recovery area for further monitoring.  PCI Data: Vessel -  LAD/Segment -  mid to distal Percent Stenosis (pre)  95% TIMI-flow 2 Stent 3.0 x 38  mm Promus Percent Stenosis (post) 0% TIMI-flow (post) 3  Final Conclusions:    1. Single vessel obstructive coronary disease with in-stent restenosis in the mid and distal LAD. 2. Normal LV function. 3. Successful stenting of the tandem lesions in the mid and  distal LAD with a long drug-eluting stent.   Recommendations:   Dual antiplatelet therapy for one year. Aggressive risk factor modification.  Theron Arista [facility] 07/11/2012, 5:35 PM

## 2012-07-12 ENCOUNTER — Encounter (HOSPITAL_COMMUNITY): Payer: Self-pay | Admitting: Nurse Practitioner

## 2012-07-12 DIAGNOSIS — I2510 Atherosclerotic heart disease of native coronary artery without angina pectoris: Secondary | ICD-10-CM

## 2012-07-12 DIAGNOSIS — I200 Unstable angina: Secondary | ICD-10-CM

## 2012-07-12 DIAGNOSIS — F17200 Nicotine dependence, unspecified, uncomplicated: Secondary | ICD-10-CM

## 2012-07-12 DIAGNOSIS — E119 Type 2 diabetes mellitus without complications: Secondary | ICD-10-CM

## 2012-07-12 DIAGNOSIS — I10 Essential (primary) hypertension: Secondary | ICD-10-CM

## 2012-07-12 DIAGNOSIS — R918 Other nonspecific abnormal finding of lung field: Secondary | ICD-10-CM

## 2012-07-12 LAB — BASIC METABOLIC PANEL
BUN: 11 mg/dL (ref 6–23)
CO2: 28 mEq/L (ref 19–32)
Chloride: 104 mEq/L (ref 96–112)
Creatinine, Ser: 1 mg/dL (ref 0.50–1.35)
Glucose, Bld: 147 mg/dL — ABNORMAL HIGH (ref 70–99)

## 2012-07-12 LAB — T4, FREE: Free T4: 1.17 ng/dL (ref 0.80–1.80)

## 2012-07-12 LAB — CBC
MCV: 84.5 fL (ref 78.0–100.0)
Platelets: 168 10*3/uL (ref 150–400)
RBC: 4.76 MIL/uL (ref 4.22–5.81)
RDW: 13.5 % (ref 11.5–15.5)
WBC: 7.1 10*3/uL (ref 4.0–10.5)

## 2012-07-12 LAB — GLUCOSE, CAPILLARY: Glucose-Capillary: 152 mg/dL — ABNORMAL HIGH (ref 70–99)

## 2012-07-12 MED ORDER — PRASUGREL HCL 10 MG PO TABS: 10.0000 mg | ORAL_TABLET | Freq: Every day | ORAL | Status: DC

## 2012-07-12 MED ORDER — NITROGLYCERIN 0.4 MG SL SUBL: 0.4000 mg | SUBLINGUAL_TABLET | SUBLINGUAL | Status: DC | PRN

## 2012-07-12 MED ORDER — OMEGA-3 FATTY ACIDS 1000 MG PO CAPS: 2.0000 g | ORAL_CAPSULE | Freq: Every day | ORAL | Status: DC

## 2012-07-12 MED ORDER — LIVING WELL WITH DIABETES BOOK
Freq: Once | Status: AC
  Administered 2012-07-12
  Filled 2012-07-12: qty 1

## 2012-07-12 MED ORDER — ACTIVE PARTNERSHIP FOR HEALTH OF YOUR HEART BOOK
Freq: Once | Status: AC
  Administered 2012-07-12
  Filled 2012-07-12: qty 1

## 2012-07-12 MED ORDER — METFORMIN HCL ER 750 MG PO TB24: 750.0000 mg | ORAL_TABLET | Freq: Two times a day (BID) | ORAL | Status: DC

## 2012-07-12 MED ORDER — ASPIRIN 81 MG PO TBEC: 81.0000 mg | DELAYED_RELEASE_TABLET | Freq: Every day | ORAL | Status: AC

## 2012-07-12 MED FILL — Sodium Chloride IV Soln 0.9%: INTRAVENOUS | Qty: 50 | Status: AC

## 2012-07-12 NOTE — Progress Notes (Signed)
Patient Name: Jerome Rodriguez Date of Encounter: 07/12/2012     Principal Problem:   Unstable angina Active Problems:   CAD (coronary artery disease)   HYPERTENSION, BENIGN   Diabetes mellitus   Tobacco abuse   HYPERLIPIDEMIA-MIXED   Abnormal chest x-ray    SUBJECTIVE  No chest pain or sob overnight.  Wrist ok.  CURRENT MEDS . aspirin  81 mg Oral Daily  . atorvastatin  20 mg Oral Daily  . insulin aspart  0-15 Units Subcutaneous TID WC  . loratadine  10 mg Oral Daily  . metoprolol succinate  25 mg Oral Daily  . prasugrel  10 mg Oral Daily    OBJECTIVE  Filed Vitals:   07/11/12 1900 07/12/12 0025 07/12/12 0558 07/12/12 0600  BP: 105/73 123/74 123/74 123/74  Pulse: 72 67 67 71  Temp: 98.1 F (36.7 C) 97.9 F (36.6 C) 98.1 F (36.7 C)   TempSrc: Oral Oral Oral   Resp: 20 18 16 18   Height:      Weight:  235 lb 7.2 oz (106.8 kg)    SpO2: 98% 94% 99% 100%    Intake/Output Summary (Last 24 hours) at 07/12/12 0646 Last data filed at 07/11/12 2000  Gross per 24 hour  Intake 1732.95 ml  Output   1250 ml  Net 482.95 ml   Filed Weights   07/10/12 1051 07/11/12 0408 07/12/12 0025  Weight: 233 lb 0.4 oz (105.7 kg) 234 lb 2.1 oz (106.2 kg) 235 lb 7.2 oz (106.8 kg)    PHYSICAL EXAM  General: Pleasant, NAD. Neuro: Alert and oriented X 3. Moves all extremities spontaneously. Psych: Normal affect. HEENT:  Normal  Neck: Supple without bruits or JVD. Lungs:  Resp regular and unlabored, CTA. Heart: RRR no s3, s4, or murmurs. Abdomen: Soft, non-tender, non-distended, BS + x 4.  Extremities: No clubbing, cyanosis or edema. DP/PT/Radials 2+ and equal bilaterally.  R wrist w/o bleeding/bruits/hematoma.  Accessory Clinical Findings  CBC  Recent Labs  07/11/12 0404 07/12/12 0515  WBC 6.4 7.1  HGB 14.0 14.0  HCT 40.3 40.2  MCV 84.5 84.5  PLT 178 168   Basic Metabolic Panel  Recent Labs  07/10/12 1501 07/12/12 0515  NA 138 139  K 4.0 4.4  CL 101 104   CO2 31 28  GLUCOSE 218* 147*  BUN 13 11  CREATININE 0.91 1.00  CALCIUM 8.5 8.4   Liver Function Tests  Recent Labs  07/10/12 1501  AST 13  ALT 14  ALKPHOS 120*  BILITOT 0.5  PROT 6.1  ALBUMIN 3.3*   Cardiac Enzymes  Recent Labs  07/10/12 1100 07/10/12 1646 07/10/12 2216  TROPONINI <0.30 <0.30 <0.30   Hemoglobin A1C  Recent Labs  07/10/12 0909  HGBA1C 7.4*   Fasting Lipid Panel  Recent Labs  07/11/12 0404  CHOL 150  HDL 29*  LDLCALC 78  TRIG 454*  CHOLHDL 5.2   Thyroid Function Tests  Recent Labs  07/10/12 0909  TSH 10.120*   TELE  rsr  ECG  Rsr, 68, no acute st/t changes.  ASSESSMENT AND PLAN  1.  [place]/CAD:  S/p cath/PCI for ISR in two previously placed LAD stents.  No chest pain overnight.  Cont asa, effient, statin, bb.  Ambulate and anticipate d/c today.  2.  HTN:  Stable.  Cont bb/acei (on enalapril as well at home).  3.  HL:  LDL 78.  Cont statin.   4.  DM:  A1c 7.4.  Resume metformin  in 48 hrs.  5.  Abnl CXR/CT:  Numerous scattered bilat noncalcified pulm nodules ranging in size from a few mm to a max of 8mm in LLL.  Radiology recs f/u in 3-6 mos.  No h/o cough/hemoptysis.  25 lb unintentional weight loss in past year.  6.  Tob Abuse:  Cessation advised.  7.  ? Hypothyroidism:  TSH 10.120.  Repeat with FT4 as well.  Has f/u with PCP on 7/18.  Signed, Nicolasa Ducking NP Patient seen and examined and history reviewed. Agree with above findings and plan. Patient has elevated triglycerides and low HDL. In addition to dietary modification, weight loss, and exercise he may benefit from fish oil 1-2 grams per day. This may also be due to hypothyroidism. Will check FT4. Plan on DC home today.  Theron Arista [facility] 07/12/2012 9:02 AM  07/12/2012 9:00 AM

## 2012-07-12 NOTE — Progress Notes (Addendum)
CARDIAC REHAB PHASE I   PRE:  Rate/Rhythm: 65 SR  BP:  Supine: 129/79  Sitting:   Standing:    SaO2: 99 RA  MODE:  Ambulation: 1000 ft   POST:  Rate/Rhythm: 82 SR  BP:  Supine:   Sitting: 114/72  Standing:    SaO2:  0745-0900 Pt tolerated ambulation well without c/o of cp or SOB. VS stable Completed stent education with pt. He voices understanding. Discussed smoking cessation with pt and gave him tips for quitting and coaching contact number. He seems very motivated to quitting. He has quit for nine months at one time but restarted. Pt also has the e-cigarette but has not stopped using that . I have encouraged him to try some of the other withdrawal medications. Pt torecliner after walk with call light in reach. Pt wants to call us about Outpt. CRP. 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  Melina Copa RN 07/12/2012 8:58 AM

## 2012-07-12 NOTE — Discharge Summary (Signed)
Patient seen and examined and history reviewed. Agree with above findings and plan. See earlier rounding note.  Thedora Hinders 07/12/2012 9:16 AM

## 2012-07-12 NOTE — Discharge Summary (Signed)
Patient ID: Jerome Rodriguez,  MRN: [id number], DOB/AGE: 1955/08/05 57 y.o.  Admit date: 07/10/2012 Discharge date: 07/12/2012  Primary Care Provider: Carollee Herter Primary Cardiologist: Judie Petit. Excell Seltzer, MD   Discharge Diagnoses Principal Problem:   Unstable angina Active Problems:   CAD (coronary artery disease)   HYPERTENSION, BENIGN   Diabetes mellitus   Tobacco abuse   HYPERLIPIDEMIA-MIXED   Abnormal chest x-ray  Allergies No Known Allergies  Procedures  Chest X-Ray 7.6.2014  IMPRESSION:  Nonspecific pattern of diffuse interstitial prominence with scattered areas of reticulonodular opacities.  Findings may represent progression of underlying chronic bronchitic changes. Alternately, an atypical infectious process or developing interstitial lung disease are possibilities in the appropriate clinical setting.  If additional imaging is clinically warranted, recommend high- resolution chest CT without contrast. _____________  High Resolution CT of the Chest 7.6.2014  IMPRESSION:  1.  Numerous scattered bilateral pulmonary nodules ranging in size from a few millimeters to a maximum of 8 mm in the left lower lobe. In the absence of a known primary malignancy, these likely represent sequelae of an old granulomatous process such as histoplasmosis or other atypical infectious process.  If the patient has a known malignancy, then hematogenous pulmonary metastases are a possibility.  Recommend repeat CT scan of the chest in 3- 6 months to evaluate for stability.  2.  Mild paraseptal emphysema with a few scattered centrilobular emphysematous blebs. 3.  Mild bronchial wall thickening. 4.  Atherosclerosis including extensive multivessel coronary artery disease 5.  Trace pericardial effusion. _____________  Cardiac Catheterization and Percutaneous Coronary Intervention 7.7.2014  PROCEDURAL FINDINGS Hemodynamics: AO 111/71 with a mean of 89 mmHg LV 110/15 mmHg            Coronary angiography: Coronary dominance: right  Left mainstem: The left main has mild irregularities less than 10%. Left anterior descending (LAD): The left anterior descending artery is moderately calcified in the proximal vessel. In the mid vessel at the site of a previous stent there is a 90% in-stent restenosis. This lesion is at the takeoff of the second diagonal. Further distally there is a second stent with a 99% stenosis within the stent. The first diagonal is without significant disease. There is a 60-70% stenosis at the takeoff of the second diagonal.   **The areas of restenosis within the LAD were covered with a 3.0 x 38 mm Promus DES.**  Left circumflex (LCx): The left circumflex is a large vessel which gives rise to 2 small marginal branches and then terminates in a large posterior lateral branch. In the mid vessel there is diffuse disease up to 20%. The distal vessel has 30-40% disease. Right coronary artery (RCA): The right coronary is a codominant vessel. There is segmental 50-60% plaque in the proximal vessel there is 30% disease at the crux. Left ventriculography: Left ventricular systolic function is normal, LVEF is estimated at 55-65%, there is no significant mitral regurgitation   Final Conclusions:     1. Single vessel obstructive coronary disease with in-stent restenosis in the mid and distal LAD. 2. Normal LV function. 3. Successful stenting of the tandem lesions in the mid and distal LAD with a long drug-eluting stent.  _____________   History of Present Illness  57 year old male with prior history of CAD status post drug-eluting stent placement to the mid and distal LAD in 2004. He was in his usual state of health until approximately 2 weeks prior to admission when he began to experience intermittent rest and exertional left-sided chest pressure  with radiation to the posterior neck as well as numbness down into his left hand and fingertips. Symptoms were  reminiscent of prior angina. Over a two-day period prior to admission symptoms worsened prompting him to present to the emergency department on July 6. There, ECG was nonacute and initial troponin was negative. Chest x-ray demonstrated nonspecific pattern of diffuse interstitial prominence with scattered areas of reticulonodular opacities. Recommendation was made for followup CT. Patient was admitted for further evaluation.  Hospital Course  Patient ruled out for myocardial infarction. He had no further chest pain. High-resolution CT was performed on July 6, and showed numerous scattered bilateral pulmonary nodules ranging in size from a few millimeters to a maximum 8 mm in the left lower lobe. Recommendation was made for followup CT in 3-6 months. Patient is a smoker and does report an unintentional 25 pound weight loss over the past year.  Given similarity of chest pain symptoms at this time to prior angina, decision was made to pursue diagnostic catheterization. This was performed on July 7, and revealed in-stent restenosis within the 2 previously placed LAD stents. He otherwise had nonobstructive disease and normal LV function. The in-stent restenosis within the LAD was successfully covered using a 3.0 x 38 mm Promus drug-eluting stent. Patient tolerated procedure well and post procedure has been doing without recurrent symptoms or limitations. We plan to discharge him home today in good condition. Of note, TSH was found to be elevated at 10.120. He has no prior history of hypothyroidism. We have repeated TSH and check a free T4 this morning however at this time those results are pending. He has followup with his primary care provider next week.  Discharge Vitals Blood pressure 129/79, pulse 86, temperature 98 F (36.7 C), temperature source Oral, resp. rate 18, height 5' 10.08" (1.78 m), weight 235 lb 7.2 oz (106.8 kg), SpO2 100.00%.  Filed Weights   07/10/12 1051 07/11/12 0408 07/12/12 0025    Weight: 233 lb 0.4 oz (105.7 kg) 234 lb 2.1 oz (106.2 kg) 235 lb 7.2 oz (106.8 kg)   Labs  CBC  Recent Labs  07/11/12 0404 07/12/12 0515  WBC 6.4 7.1  HGB 14.0 14.0  HCT 40.3 40.2  MCV 84.5 84.5  PLT 178 168   Basic Metabolic Panel  Recent Labs  07/10/12 1501 07/12/12 0515  NA 138 139  K 4.0 4.4  CL 101 104  CO2 31 28  GLUCOSE 218* 147*  BUN 13 11  CREATININE 0.91 1.00  CALCIUM 8.5 8.4   Liver Function Tests  Recent Labs  07/10/12 1501  AST 13  ALT 14  ALKPHOS 120*  BILITOT 0.5  PROT 6.1  ALBUMIN 3.3*   Cardiac Enzymes  Recent Labs  07/10/12 1100 07/10/12 1646 07/10/12 2216  TROPONINI <0.30 <0.30 <0.30   Hemoglobin A1C  Recent Labs  07/10/12 0909  HGBA1C 7.4*   Fasting Lipid Panel  Recent Labs  07/11/12 0404  CHOL 150  HDL 29*  LDLCALC 78  TRIG 960*  CHOLHDL 5.2   Thyroid Function Tests  Recent Labs  07/10/12 0909  TSH 10.120*   Disposition  Pt is being discharged home today in good condition.  Follow-up Plans & Appointments  Follow-up Information   Follow up with Carollee Herter, MD On 07/22/2012. (9:15 AM )    Contact information:   [address] [place] [address] [phone]       Follow up with Tereso Newcomer, PA-C On 07/28/2012. (11:50 PM - Dr. Earmon Phoenix  PA)    Contact information:   [address] [place] [address] [phone]      Discharge Medications    Medication List         ACCU-CHEK AVIVA PLUS test strip  Generic drug:  glucose blood  USE AS DIRECTED     aspirin 81 MG EC tablet  Take 1 tablet (81 mg total) by mouth daily.     atorvastatin 20 MG tablet  Commonly known as:  LIPITOR  Take 1 tablet (20 mg total) by mouth daily.     Blood Glucose Meter kit  THIS IS FOR THE ACCU-CHECK VIVA     enalapril 2.5 MG tablet  Commonly known as:  VASOTEC  Take 1 tablet (2.5 mg total) by mouth daily.     loratadine 10 MG tablet  Commonly known as:  CLARITIN   Take 10 mg by mouth daily.     metFORMIN 750 MG 24 hr tablet - TO BE RESUMED ON 07/14/2012  Commonly known as:  GLUCOPHAGE-XR  Take 1 tablet (750 mg total) by mouth 2 (two) times daily at 10 AM and 5 PM.     metoprolol succinate 25 MG 24 hr tablet  Commonly known as:  TOPROL-XL  Take 1 tablet (25 mg total) by mouth daily.     multivitamin tablet  Take 1 tablet by mouth daily.     nitroGLYCERIN 0.4 MG SL tablet  Commonly known as:  NITROSTAT  Place 1 tablet (0.4 mg total) under the tongue every 5 (five) minutes x 3 doses as needed for chest pain.     prasugrel 10 MG Tabs  Commonly known as:  EFFIENT  Take 1 tablet (10 mg total) by mouth daily.         Fish Oil 2 Gram Tabs      Take 1 tablet (2 Grams total) by mouth daily.   Outstanding Labs/Studies  **Follow Up TSH/Free T4 - drawn prior to discharge. **Needs f/u Chest CT in 3 months 2/2 LLL pulm nodules.  Duration of Discharge Encounter   Greater than 30 minutes including physician time.  Signed, Nicolasa Ducking NP 07/12/2012, 9:11 AM

## 2012-07-12 NOTE — Progress Notes (Signed)
Came to visit patient at bedside on behalf of [facility] program. However, he was already discharged. Will perform post hospital discharge call. Raiford Noble, MSN-Ed, RN,BSN- [facility] [phone]

## 2012-07-15 ENCOUNTER — Encounter: Payer: Self-pay | Admitting: Family Medicine

## 2012-07-15 ENCOUNTER — Ambulatory Visit (INDEPENDENT_AMBULATORY_CARE_PROVIDER_SITE_OTHER): Payer: 59 | Admitting: Family Medicine

## 2012-07-15 VITALS — BP 128/70 | HR 82 | Wt 240.0 lb

## 2012-07-15 DIAGNOSIS — E119 Type 2 diabetes mellitus without complications: Secondary | ICD-10-CM

## 2012-07-15 DIAGNOSIS — I2510 Atherosclerotic heart disease of native coronary artery without angina pectoris: Secondary | ICD-10-CM

## 2012-07-15 DIAGNOSIS — R918 Other nonspecific abnormal finding of lung field: Secondary | ICD-10-CM

## 2012-07-15 DIAGNOSIS — R9389 Abnormal findings on diagnostic imaging of other specified body structures: Secondary | ICD-10-CM

## 2012-07-15 DIAGNOSIS — K219 Gastro-esophageal reflux disease without esophagitis: Secondary | ICD-10-CM

## 2012-07-15 DIAGNOSIS — R7611 Nonspecific reaction to tuberculin skin test without active tuberculosis: Secondary | ICD-10-CM | POA: Insufficient documentation

## 2012-07-15 NOTE — Progress Notes (Signed)
  Subjective:    Patient ID: Jerome Rodriguez, male    DOB: 1955/02/28, 57 y.o.   MRN: [id number]  HPI He is here for followup visit after recent hospitalization and catheterization. His symptoms initially were chest pain with referred pain and shortness of breath. Catheterization did show lesions and he was stented. X-rays were also taken which did show some nonspecific changes. A CT scan was also reviewed. He also has a previous history of a hiatus hernia and recently he's noted an increase in no symptoms. He also has made dietary changes, eating more fast foods and drinking more coffee. He does smoke cigars but says one will last several days. He was placed on Effient and and has noted some epistaxis since then   Review of Systems     Objective:   Physical Exam        Assessment & Plan:

## 2012-07-15 NOTE — Progress Notes (Signed)
Subjective:    Jerome Rodriguez is a 57 y.o. male who presents for follow-up of Type 2 diabetes mellitus.    Home blood sugar records: [address] LOW 113  Current symptoms/problem:NONE Daily foot checks, foot concerns: YES / NO Last eye exam:  03/04/11 DIGBEY   Medication compliance: Current diet: LOW CARB Current exercise:WALKING   Known diabetic complications: cardiovascular disease Cardiovascular risk factors: advanced age (older than 39 for men, 64 for women), diabetes mellitus, dyslipidemia, hypertension, male gender, obesity (BMI >= 30 kg/m2) and sedentary lifestyle   The following portions of the patient's history were reviewed and updated as appropriate: allergies, current medications, past family history, past medical history, past social history and problem list. Discussion with him indicates that in the early 90s he had a positive PPD and was treated at that time time.  ROS as in subjective above    Objective:    There were no vitals taken for this visit.  There were no vitals filed for this visit.  General appearence: alert, no distress, WD/WN Neck: supple, no lymphadenopathy, no thyromegaly, no masses Heart: RRR, normal S1, S2, no murmurs Lungs: CTA bilaterally, no wheezes, rhonchi, or rales Abdomen: +bs, soft, non tender, non distended, no masses, no hepatomegaly, no splenomegaly Pulses: 2+ symmetric, upper and lower extremities, normal cap refill Ext: no edema Foot exam:  Neuro: foot monofilament exam normal   Lab Review Lab Results  Component Value Date   HGBA1C 7.4* 07/10/2012   Lab Results  Component Value Date   CHOL 150 07/11/2012   HDL 29* 07/11/2012   LDLCALC 78 07/11/2012   TRIG 214* 07/11/2012   CHOLHDL 5.2 07/11/2012   No results found for this basenameConcepcion Elk     Chemistry      Component Value Date/Time   NA 139 07/12/2012 0515   K 4.4 07/12/2012 0515   CL 104 07/12/2012 0515   CO2 28 07/12/2012 0515   BUN 11 07/12/2012 0515   CREATININE 1.00 07/12/2012 0515   CREATININE 0.97 07/16/2011 1058      Component Value Date/Time   CALCIUM 8.4 07/12/2012 0515   ALKPHOS 120* 07/10/2012 1501   AST 13 07/10/2012 1501   ALT 14 07/10/2012 1501   BILITOT 0.5 07/10/2012 1501        Chemistry      Component Value Date/Time   NA 139 07/12/2012 0515   K 4.4 07/12/2012 0515   CL 104 07/12/2012 0515   CO2 28 07/12/2012 0515   BUN 11 07/12/2012 0515   CREATININE 1.00 07/12/2012 0515   CREATININE 0.97 07/16/2011 1058      Component Value Date/Time   CALCIUM 8.4 07/12/2012 0515   ALKPHOS 120* 07/10/2012 1501   AST 13 07/10/2012 1501   ALT 14 07/10/2012 1501   BILITOT 0.5 07/10/2012 1501       Last optometry/ophthalmology exam reviewed from:    Assessment:   Encounter Diagnoses  Name Primary?  . Positive PPD, treated Yes  . GERD (gastroesophageal reflux disease)   . Abnormal chest x-ray   . CAD, NATIVE VESSEL   . Diabetes mellitus          Plan:    1.  Rx changes: none 2.  Education: Reviewed 'ABCs' of diabetes management (respective goals in parentheses):  A1C (<7), blood pressure (<130/80), and cholesterol (LDL <100). 3.  Compliance at present is estimated to be fair. Efforts to improve compliance (if necessary) will be directed at dietary modifications: Return to  more healthy eating habits and increased exercise. 4. Follow up: 4 months  Encouraged him to use a PPI or an H2 blocker to help with his hernia symptoms. Again reinforced need for him to make dietary changes and continue with his exercise pattern. I also discussed the abnormal chest x-ray. I explained that this is probably an old change since he is really not having any pulmonary symptoms referable specifically to the lungs. He is comfortable with this. We'll set up a chest CT for 6 months.

## 2012-07-15 NOTE — Patient Instructions (Addendum)
Treat your stomach symptoms with either Zantac, Pepcid, Axid or Tagamet. You can also try Prilosec. Tried one and if that doesn't work double it. Time to change your eating habits to something more healthy

## 2012-07-22 ENCOUNTER — Ambulatory Visit: Payer: Self-pay | Admitting: Family Medicine

## 2012-07-28 ENCOUNTER — Encounter: Payer: 59 | Admitting: Physician Assistant

## 2012-08-01 ENCOUNTER — Ambulatory Visit: Payer: 59 | Admitting: Cardiovascular Disease

## 2012-08-10 ENCOUNTER — Ambulatory Visit (INDEPENDENT_AMBULATORY_CARE_PROVIDER_SITE_OTHER): Payer: 59 | Admitting: Cardiovascular Disease

## 2012-08-10 ENCOUNTER — Encounter: Payer: Self-pay | Admitting: Cardiovascular Disease

## 2012-08-10 VITALS — BP 110/76 | HR 83 | Ht 70.0 in | Wt 236.0 lb

## 2012-08-10 DIAGNOSIS — E7800 Pure hypercholesterolemia, unspecified: Secondary | ICD-10-CM

## 2012-08-10 DIAGNOSIS — I2510 Atherosclerotic heart disease of native coronary artery without angina pectoris: Secondary | ICD-10-CM

## 2012-08-10 NOTE — Patient Instructions (Signed)
Your physician recommends that you continue on your current medications as directed. Please refer to the Current Medication list given to you today.  Your physician recommends that you return for a FASTING LIPID, LIVER and BMP in 6 MONTHS--nothing to eat or drink after midnight, lab opens at 7:30 (please have labs drawn one week prior to appointment)  Your physician wants you to follow-up in: 6 MONTHS with Dr Excell Seltzer.  You will receive a reminder letter in the mail two months in advance. If you don't receive a letter, please call our office to schedule the follow-up appointment.

## 2012-08-10 NOTE — Progress Notes (Signed)
HPI:  57 year old gentleman presenting for followup evaluation. The patient has been followed for coronary artery disease. He initially underwent stenting of the LAD in 2004. He presented one month ago with symptoms of unstable angina and was found to have 99% stenosis within one of his LAD stents. He was treated with PCI using a single drug-eluting stent. He presents today for hospital followup. Lipids were checked during his hospital stay and his cholesterol is 150, triglycerides 214, HDL 29, LDL 70.  The patient is doing well since his most recent PCI procedure. He is down from 8 cigars to 2 cigars per day. He anticipates quitting altogether in the next few months. He denies recurrence of chest pain, dyspnea, or other cardiac complaints. He is walking 1 hour daily without exertional symptoms. He denies bleeding problems. He does complain of positional lightheadedness, most noticeable at nighttime when he is on his right or left side. He is not having postural orthostatic type symptoms.  Outpatient Encounter Prescriptions as of 08/10/2012  Medication Sig Dispense Refill  . ACCU-CHEK AVIVA PLUS test strip USE AS DIRECTED  300 each  1  . aspirin 81 MG EC tablet Take 1 tablet (81 mg total) by mouth daily.      Marland Kitchen atorvastatin (LIPITOR) 20 MG tablet Take 1 tablet (20 mg total) by mouth daily.  90 tablet  3  . Blood Glucose Monitoring Suppl (BLOOD GLUCOSE METER) kit THIS IS FOR THE ACCU-CHECK VIVA  1 each  0  . enalapril (VASOTEC) 2.5 MG tablet Take 1 tablet (2.5 mg total) by mouth daily.  90 tablet  3  . loratadine (CLARITIN) 10 MG tablet Take 10 mg by mouth daily.      . metFORMIN (GLUCOPHAGE-XR) 750 MG 24 hr tablet Take 1 tablet (750 mg total) by mouth 2 (two) times daily at 10 AM and 5 PM.  180 tablet  1  . metoprolol succinate (TOPROL-XL) 25 MG 24 hr tablet Take 1 tablet (25 mg total) by mouth daily.  90 tablet  3  . Multiple Vitamin (MULTIVITAMIN) tablet Take 1 tablet by mouth daily.        .  nitroGLYCERIN (NITROSTAT) 0.4 MG SL tablet Place 1 tablet (0.4 mg total) under the tongue every 5 (five) minutes x 3 doses as needed for chest pain.  25 tablet  3  . Omega-3 Fatty Acids (OMEGA-3 FISH OIL) 1200 MG CAPS Take by mouth daily.      . prasugrel (EFFIENT) 10 MG TABS Take 1 tablet (10 mg total) by mouth daily.  30 tablet  6  . [DISCONTINUED] fish oil-omega-3 fatty acids 1000 MG capsule Take 2 capsules (2 g total) by mouth daily.  30 capsule  6   No facility-administered encounter medications on file as of 08/10/2012.    Allergies  Allergen Reactions  . Lactose Intolerance (Gi)     Past Medical History  Diagnosis Date  . CAD (coronary artery disease)     a. NSTEMI 10/04 => LHC: mLAD 99%=> Taxus DES to mLAD and Taxus DES to dLAD;  b. ETT-Myoview 11/2011: normal, EF 73%, no ischemia; c. 07/2012 Cath/PCI: LM <10, LAD 90 ISRp/99 ISRd (3.0x38 Promus DES), LCX 53m, 30-40d, RCA 50-60p, 30d, EF 55-65%.  . Dyslipidemia   . HTN (hypertension)   . Obesity   . Diabetes mellitus   . ED (erectile dysfunction)   . Smoker     CIGARS  . Abnormal chest CT     a. 07/2012: scattered bilat  noncalcified pulm nodules ranging in size from a few mm to a max of 8mm in LLL - rec f/u in 3-6 mos.    ROS: Negative except as per HPI  BP 110/76  Pulse 83  Ht 5\' 10"  (1.778 m)  Wt 107.049 kg (236 lb)  BMI 33.86 kg/m2  PHYSICAL EXAM: Pt is alert and oriented, NAD HEENT: normal Neck: JVP - normal, carotids 2+= without bruits Lungs: CTA bilaterally CV: RRR without murmur or gallop Abd: soft, NT, Positive BS, no hepatomegaly Ext: no C/C/E, distal pulses intact and equal Skin: warm/dry no rash  ASSESSMENT AND PLAN: 1. Coronary artery disease, native vessel. Hospital records reviewed with recent presentation with unstable angina. His anginal symptoms have now resolved. He should remain on dual antiplatelet therapy with aspirin and effient for at least one year. Secondary risk reduction measures as  below.  2. Hypertension. Blood pressure is controlled on a combination of metoprolol and enalapril.  3. Hyperlipidemia. Recent lipids reviewed. Discussed diet and exercise. He is on a statin drug. Will repeat a lipid panel before his return visit in 6 months.  4. Tobacco: Cessation counseling done.  Tonny Bollman 08/10/2012 10:43 AM

## 2012-08-12 ENCOUNTER — Encounter: Payer: Self-pay | Admitting: Cardiovascular Disease

## 2012-08-23 ENCOUNTER — Encounter: Payer: 59 | Admitting: Physician Assistant

## 2012-11-09 ENCOUNTER — Telehealth: Payer: Self-pay | Admitting: Cardiovascular Disease

## 2012-11-09 NOTE — Telephone Encounter (Signed)
Pt called to get samples for Effient 10 mg . Pt states has no insurance now and he took the last pill today.  10 mg/35 sample   pills given to pt family member ( missy) to take to pt as requested for pt. Pt aware.

## 2012-11-09 NOTE — Telephone Encounter (Signed)
New Problem  Pt requests a call back he is out of blood thinner and request a call back to discuss

## 2012-11-15 ENCOUNTER — Ambulatory Visit: Payer: 59 | Admitting: Family Medicine

## 2012-12-08 ENCOUNTER — Telehealth: Payer: Self-pay | Admitting: Cardiovascular Disease

## 2012-12-08 ENCOUNTER — Telehealth: Payer: Self-pay | Admitting: *Deleted

## 2012-12-08 MED ORDER — METOPROLOL SUCCINATE ER 25 MG PO TB24: 25.0000 mg | ORAL_TABLET | Freq: Every day | ORAL | Status: DC

## 2012-12-08 NOTE — Telephone Encounter (Signed)
Patient states he has been taking generic OTC allergy medications for over three years and no longer taking Claritin 10mg  ("since he can use OTC medications). He has recently switched to Equate brand allergy tablets and he feels they are possibly making him dizzy. He states he takes his allergy medications in the mornings and this morning he was so dizzy that he fell down. He states that he felt faint but he got up and thought he should call to see what he should do regarding this problem.  Patient was unable to provide exact active ingredients in OTC medication. He will check and call office back with list of active ingredients on the OTC allergy med.  Patient called back with active ingredient to the OTC allergy medication. It is Fexofenadine HCL/Equate brand from [facility].  Reviewed with Pharmacist, Alfonse Ras, and he advised that Fexofenadine HCl does have reported adverse reactions of dizziness.  He further advised that patient can return to taking his OTC Claritin or he can try OTC Zyrtec, however Zyrtec should be taken at bedtime, as it can cause increased somnolence. Patient verbalized understanding and stated he will try the OTC Zyrtec. Patient also provided with 30 day supply of Effient 10mg  (samples) that have been left at the front desk for pickup.

## 2012-12-08 NOTE — Telephone Encounter (Signed)
Patient called back to request refill of metoprolol to be called in. Also reviewed that he is still taking the Omega 3 fish oil, as listed on his medication list. Provided education regarding his Effient (indication, dosage, frequency, long term indications). Patient verbalized appreciation and understanding of medication education.

## 2012-12-08 NOTE — Telephone Encounter (Signed)
Follow Up ° °Patient returned call °

## 2012-12-08 NOTE — Telephone Encounter (Signed)
New problem    Pt has a question about  [facility] Generic Allergy 180 mg med and his other med?   He does not feel well and gets dizzy  after he takes it this morning he fainted.   Please give pt a call back.

## 2012-12-16 ENCOUNTER — Telehealth: Payer: Self-pay | Admitting: Cardiovascular Disease

## 2012-12-16 MED ORDER — CLOPIDOGREL BISULFATE 75 MG PO TABS: 75.0000 mg | ORAL_TABLET | Freq: Every day | ORAL | Status: DC

## 2012-12-16 NOTE — Telephone Encounter (Signed)
Pt told to start today and yes he can take at hs.

## 2012-12-16 NOTE — Telephone Encounter (Signed)
Pt is experiencing dizziness off and on for months and is relating the dizziness to effient that was started in 7/14 Pt has not checked bp/ no monitor. Glucose running ok per pt. Pt has not taken effient today and is feeling better.  1) Pt would like to go back to plavix which he has taken in the past. 2) Pt would like to know if there would be a problem with the residual effient and then to mix it with plavix.  Pt was told that I will call him later today with advice.

## 2012-12-16 NOTE — Telephone Encounter (Signed)
New message  Patient is taking effient and feels that he is having side effect. He is dizzy and feels like he is going to pass out. He has been taking this medication since July. Please call and advise.

## 2012-12-16 NOTE — Telephone Encounter (Signed)
Ok to switch back to plavix if side effects from effient. Would start 75 mg daily beginning today.

## 2012-12-16 NOTE — Telephone Encounter (Signed)
Follow up    Pt forgot to ask Dr Excell Seltzer if he can take his plavix at night with all of his other medications

## 2013-01-16 ENCOUNTER — Other Ambulatory Visit: Payer: Self-pay | Admitting: Cardiovascular Disease

## 2013-01-27 ENCOUNTER — Other Ambulatory Visit: Payer: Self-pay | Admitting: Cardiovascular Disease

## 2013-02-15 ENCOUNTER — Telehealth: Payer: Self-pay | Admitting: Family Medicine

## 2013-02-15 DIAGNOSIS — E119 Type 2 diabetes mellitus without complications: Secondary | ICD-10-CM

## 2013-02-15 NOTE — Telephone Encounter (Signed)
Received fax refill request from [facility]   Metformin HCL ER  750 mg  90 day supply

## 2013-02-16 NOTE — Telephone Encounter (Signed)
Don't let them run out but he needs an appointment

## 2013-02-17 ENCOUNTER — Telehealth: Payer: Self-pay | Admitting: Internal Medicine

## 2013-02-17 DIAGNOSIS — E119 Type 2 diabetes mellitus without complications: Secondary | ICD-10-CM

## 2013-02-17 MED ORDER — METFORMIN HCL ER 750 MG PO TB24: 750.0000 mg | ORAL_TABLET | Freq: Two times a day (BID) | ORAL | Status: DC

## 2013-02-17 MED ORDER — METFORMIN HCL ER 750 MG PO TB24
750.0000 mg | ORAL_TABLET | Freq: Two times a day (BID) | ORAL | Status: DC
Start: 2013-02-17 — End: 2013-02-17

## 2013-02-17 NOTE — Telephone Encounter (Signed)
Sent in meds.. Pt has an appt in March for med check

## 2013-02-17 NOTE — Telephone Encounter (Signed)
Pharmacy needed it sent in again

## 2013-02-17 NOTE — Telephone Encounter (Signed)
Faxed med to pharmacy

## 2013-02-17 NOTE — Addendum Note (Signed)
Addended by: Pernell Dupre A on: 02/17/2013 03:31 PM   Modules accepted: Orders

## 2013-02-21 ENCOUNTER — Other Ambulatory Visit: Payer: Self-pay

## 2013-02-21 MED ORDER — ATORVASTATIN CALCIUM 20 MG PO TABS: ORAL_TABLET | ORAL | Status: DC

## 2013-02-21 MED ORDER — ENALAPRIL MALEATE 2.5 MG PO TABS: ORAL_TABLET | ORAL | Status: DC

## 2013-02-27 ENCOUNTER — Telehealth: Payer: Self-pay | Admitting: Family Medicine

## 2013-03-01 ENCOUNTER — Telehealth: Payer: Self-pay | Admitting: Family Medicine

## 2013-03-01 NOTE — Telephone Encounter (Signed)
Pt states he doesn't know the name of any [employer] products

## 2013-03-03 ENCOUNTER — Other Ambulatory Visit: Payer: Self-pay

## 2013-03-03 MED ORDER — CLOPIDOGREL BISULFATE 75 MG PO TABS: 75.0000 mg | ORAL_TABLET | Freq: Every day | ORAL | Status: DC

## 2013-03-03 MED ORDER — GLUCOSE BLOOD VI STRP: ORAL_STRIP | Status: DC

## 2013-03-03 MED ORDER — FREESTYLE LANCETS MISC: Status: DC

## 2013-03-03 MED ORDER — BLOOD GLUCOSE METER KIT: PACK | Status: DC

## 2013-03-03 MED ORDER — METOPROLOL SUCCINATE ER 25 MG PO TB24: 25.0000 mg | ORAL_TABLET | Freq: Every day | ORAL | Status: DC

## 2013-03-03 NOTE — Telephone Encounter (Signed)
done

## 2013-03-03 NOTE — Telephone Encounter (Signed)
Pt states he will contact [employer] to get the name of the [employer] products that they will cover

## 2013-03-03 NOTE — Telephone Encounter (Signed)
Sent everything to the pharmacy. Pt test 3 times a day using freestyle meter

## 2013-03-21 ENCOUNTER — Telehealth: Payer: Self-pay | Admitting: Family Medicine

## 2013-03-21 ENCOUNTER — Ambulatory Visit (INDEPENDENT_AMBULATORY_CARE_PROVIDER_SITE_OTHER): Payer: No Typology Code available for payment source | Admitting: Family Medicine

## 2013-03-21 ENCOUNTER — Encounter: Payer: Self-pay | Admitting: Family Medicine

## 2013-03-21 VITALS — BP 100/68 | HR 68 | Wt 226.0 lb

## 2013-03-21 DIAGNOSIS — E785 Hyperlipidemia, unspecified: Secondary | ICD-10-CM

## 2013-03-21 DIAGNOSIS — I10 Essential (primary) hypertension: Secondary | ICD-10-CM

## 2013-03-21 DIAGNOSIS — E1169 Type 2 diabetes mellitus with other specified complication: Secondary | ICD-10-CM

## 2013-03-21 DIAGNOSIS — E1159 Type 2 diabetes mellitus with other circulatory complications: Secondary | ICD-10-CM

## 2013-03-21 DIAGNOSIS — F17200 Nicotine dependence, unspecified, uncomplicated: Secondary | ICD-10-CM

## 2013-03-21 DIAGNOSIS — I152 Hypertension secondary to endocrine disorders: Secondary | ICD-10-CM

## 2013-03-21 DIAGNOSIS — E119 Type 2 diabetes mellitus without complications: Secondary | ICD-10-CM

## 2013-03-21 DIAGNOSIS — I2510 Atherosclerotic heart disease of native coronary artery without angina pectoris: Secondary | ICD-10-CM

## 2013-03-21 DIAGNOSIS — Z720 Tobacco use: Secondary | ICD-10-CM

## 2013-03-21 DIAGNOSIS — E669 Obesity, unspecified: Secondary | ICD-10-CM

## 2013-03-21 LAB — POCT GLYCOSYLATED HEMOGLOBIN (HGB A1C): Hemoglobin A1C: 6.7

## 2013-03-21 NOTE — Telephone Encounter (Signed)
Pt at check in states that he is having issues with his insurance company concerning his diabetic supplies. He has filed a Tourist information centre manager with the Programmer, applications. Pt states that until he informs [place] of a change do not refill any diabetic supplies. This is a Pharmacist, hospital.

## 2013-03-21 NOTE — Progress Notes (Signed)
   Subjective:    Patient ID: Jerome Rodriguez, male    DOB: 03/06/1955, 58 y.o.   MRN: [id number]  HPI He is here for a diabetes medication check. He continues on medications listed in the chart. He checks his blood sugars twice per day and they run 130-140. He has made dietary changes and lost several pounds. He walks an hour and a half every day. His last eye exam was a little over year ago. He is working to get into see the eye doctor. He checks his feet regularly. He smokes 2 or 3 cigars per day. He rarely drinks. He saw his cardiologist last July. That record was reviewed. At this time he is having no chest pain, shortness of breath, weakness.   Review of Systems     Objective:   Physical Exam Alert and in no distress. Hemoglobin A1c is 6.7 her previous number was 7.4      Assessment & Plan:  ASHD (arteriosclerotic heart disease)  Diabetes mellitus  HYPERLIPIDEMIA-MIXED  Hypertension associated with diabetes  Obesity (BMI 30-39.9)  Tobacco abuse  I encouraged him to continue to take good care of himself. Recheck here in 4 months. Recommend he set up a cardiology appointment after that date. We will draw blood with his next visit

## 2013-05-18 ENCOUNTER — Other Ambulatory Visit: Payer: Self-pay | Admitting: Family Medicine

## 2013-05-19 ENCOUNTER — Ambulatory Visit: Payer: 59 | Admitting: Cardiovascular Disease

## 2013-06-02 ENCOUNTER — Telehealth: Payer: Self-pay | Admitting: Cardiovascular Disease

## 2013-06-02 ENCOUNTER — Other Ambulatory Visit: Payer: Self-pay | Admitting: Cardiovascular Disease

## 2013-06-02 MED ORDER — ENALAPRIL MALEATE 2.5 MG PO TABS: ORAL_TABLET | ORAL | Status: DC

## 2013-06-02 MED ORDER — ATORVASTATIN CALCIUM 20 MG PO TABS: ORAL_TABLET | ORAL | Status: DC

## 2013-06-02 MED ORDER — METOPROLOL SUCCINATE ER 25 MG PO TB24: 25.0000 mg | ORAL_TABLET | Freq: Every day | ORAL | Status: DC

## 2013-06-02 MED ORDER — CLOPIDOGREL BISULFATE 75 MG PO TABS: 75.0000 mg | ORAL_TABLET | Freq: Every day | ORAL | Status: DC

## 2013-06-02 NOTE — Telephone Encounter (Signed)
I spoke with the pt and he has ran out of atorvastatin, plavix and enalapril (pt has 8 day supply of Metoprolol remaining). I have sent 90 day prescriptions to [facility] and authorized a 10 day supply to the [facility] per the pt's request. The pt is aware he needs to keep appointment on 7/10 /15 with Dr Burt Knack.

## 2013-06-02 NOTE — Telephone Encounter (Signed)
New message      Want to talk to Laura----pt would not tell me what he wanted

## 2013-07-11 ENCOUNTER — Encounter: Payer: No Typology Code available for payment source | Admitting: Family Medicine

## 2013-07-14 ENCOUNTER — Ambulatory Visit (INDEPENDENT_AMBULATORY_CARE_PROVIDER_SITE_OTHER): Payer: No Typology Code available for payment source | Admitting: Cardiovascular Disease

## 2013-07-14 ENCOUNTER — Other Ambulatory Visit: Payer: 59

## 2013-07-14 ENCOUNTER — Encounter: Payer: Self-pay | Admitting: Cardiovascular Disease

## 2013-07-14 VITALS — BP 105/72 | HR 86 | Ht 70.0 in | Wt 234.8 lb

## 2013-07-14 DIAGNOSIS — Z79899 Other long term (current) drug therapy: Secondary | ICD-10-CM

## 2013-07-14 DIAGNOSIS — I2510 Atherosclerotic heart disease of native coronary artery without angina pectoris: Secondary | ICD-10-CM

## 2013-07-14 DIAGNOSIS — E785 Hyperlipidemia, unspecified: Secondary | ICD-10-CM

## 2013-07-14 LAB — LIPID PANEL
CHOLESTEROL: 150 mg/dL (ref 0–200)
HDL: 34.2 mg/dL — ABNORMAL LOW (ref >39.00)
LDL Cholesterol: 61 mg/dL (ref 0–99)
NonHDL: 115.8
TRIGLYCERIDES: 273 mg/dL — AB (ref 0.0–149.0)
Total CHOL/HDL Ratio: 4
VLDL: 54.6 mg/dL — ABNORMAL HIGH (ref 0.0–40.0)

## 2013-07-14 LAB — HEPATIC FUNCTION PANEL
ALBUMIN: 4 g/dL (ref 3.5–5.2)
ALT: 26 U/L (ref 0–53)
AST: 20 U/L (ref 0–37)
Alkaline Phosphatase: 107 U/L (ref 39–117)
BILIRUBIN DIRECT: 0 mg/dL (ref 0.0–0.3)
TOTAL PROTEIN: 7.1 g/dL (ref 6.0–8.3)
Total Bilirubin: 0.6 mg/dL (ref 0.2–1.2)

## 2013-07-14 NOTE — Patient Instructions (Signed)
Your physician recommends that you continue on your current medications as directed. Please refer to the Current Medication list given to you today.   Your physician recommends that you return for lab work in:  [place] wants you to follow-up in: [place] will receive a reminder letter in the mail two months in advance. If you don't receive a letter, please call our office to schedule the follow-up appointment.

## 2013-07-14 NOTE — Progress Notes (Signed)
HPI:  58 year old gentleman presenting for followup evaluation. The patient has been followed for coronary artery disease. He initially underwent stenting of the LAD in 2004. He presented one year ago with symptoms of unstable angina and was found to have 99% stenosis within one of his LAD stents. He was treated with PCI using a single drug-eluting stent.  The patient is doing well at present. He has cut back on cigars, but unfortunately continues to smoke a little every day. He denies chest pain, chest pressure, edema, orthopnea, or PND. He walks regularly without exertional symptoms. He has not been able to lose any weight. He's trying his best to a healthy. He reports compliance with his medications.   Outpatient Encounter Prescriptions as of 07/14/2013  Medication Sig  . aspirin 81 MG EC tablet Take 1 tablet (81 mg total) by mouth daily.  Marland Kitchen atorvastatin (LIPITOR) 20 MG tablet TAKE 1 TABLET BY MOUTH ONCE DAILY  . Blood Glucose Monitoring Suppl (BLOOD GLUCOSE METER) kit Pt wants a Freestyle meter, lancets and test strips.  . clopidogrel (PLAVIX) 75 MG tablet Take 1 tablet (75 mg total) by mouth daily.  . enalapril (VASOTEC) 2.5 MG tablet TAKE 1 TABLET BY MOUTH ONCE DAILY  . loratadine (CLARITIN) 10 MG tablet Take 10 mg by mouth daily.  . metFORMIN (GLUCOPHAGE-XR) 750 MG 24 hr tablet TAKE 1 TABLET TWICE A DAY  . metoprolol succinate (TOPROL-XL) 25 MG 24 hr tablet Take 1 tablet (25 mg total) by mouth daily.  . Multiple Vitamin (MULTIVITAMIN) tablet Take 1 tablet by mouth daily.    . nitroGLYCERIN (NITROSTAT) 0.4 MG SL tablet Place 1 tablet (0.4 mg total) under the tongue every 5 (five) minutes x 3 doses as needed for chest pain.    Allergies  Allergen Reactions  . Effient [Prasugrel] Other (See Comments)    dizziness  . Lactose Intolerance (Gi)     Past Medical History  Diagnosis Date  . CAD (coronary artery disease)     a. NSTEMI 10/04 => LHC: mLAD 99%=> Taxus DES to mLAD and Taxus  DES to dLAD;  b. ETT-Myoview 11/2011: normal, EF 73%, no ischemia; c. 07/2012 Cath/PCI: LM <10, LAD 90 ISRp/99 ISRd (3.0x38 Promus DES), LCX 70m, 30-40d, RCA 50-60p, 30d, EF 55-65%.  . Dyslipidemia   . HTN (hypertension)   . Obesity   . Diabetes mellitus   . ED (erectile dysfunction)   . Smoker     CIGARS  . Abnormal chest CT     a. 07/2012: scattered bilat noncalcified pulm nodules ranging in size from a few mm to a max of 85mm in LLL - rec f/u in 3-6 mos.    ROS: Negative except as per HPI  BP 105/72  Pulse 86  Ht $R'5\' 10"'Oo$  (1.778 m)  Wt 106.505 kg (234 lb 12.8 oz)  BMI 33.69 kg/m2  PHYSICAL EXAM: Pt is alert and oriented, pleasant overweight male in NAD HEENT: normal Neck: JVP - normal, carotids 2+= without bruits Lungs: CTA bilaterally CV: RRR without murmur or gallop Abd: soft, NT, Positive BS, no hepatomegaly Ext: no C/C/E, distal pulses intact and equal Skin: warm/dry no rash  EKG:  Normal sinus rhythm 81 beats per minute, within normal limits.  ASSESSMENT AND PLAN: 1. Coronary atherosclerosis, native vessel. The patient has had repeated PCI procedures. I would recommend that he continue on long-term dual antiplatelet therapy with aspirin and Plavix. I will see him back in one year for followup evaluation. He is  not having any anginal symptoms at present.  2. Hypertension. Continues on metoprolol and enalapril. Blood pressure is well controlled.  3. Hyperlipidemia. Last lipids reviewed from July 2014: Lipid Panel     Component Value Date/Time   CHOL 150 07/11/2012 0404   TRIG 214* 07/11/2012 0404   HDL 29* 07/11/2012 0404   CHOLHDL 5.2 07/11/2012 0404   VLDL 43* 07/11/2012 0404   LDLCALC 78 07/11/2012 0404   Will repeat lipids and LFTs today. The patient is fasting.  4. Tobacco abuse. Counseling done today to encourage complete cessation.  Sherren Mocha 07/14/2013 12:01 PM

## 2013-07-26 ENCOUNTER — Telehealth: Payer: Self-pay | Admitting: Cardiovascular Disease

## 2013-07-26 NOTE — Telephone Encounter (Signed)
Contacted pt and gave him his lipid profile and LFT results per Dr Burt Knack.  Per Dr Burt Knack the pts labs were ok.  Per Dr Burt Knack the pt needs lifestyle modification for hypertriglyceridemia. Printed off resources of food choices to lower his triglycerides and mailed it to the pt.  Pt verbalized understanding and agrees with the plan.  Pt very appreciative of all the assistance provided.

## 2013-07-26 NOTE — Telephone Encounter (Signed)
Patient is returning call for test results, please call back.

## 2013-08-15 ENCOUNTER — Other Ambulatory Visit: Payer: Self-pay | Admitting: Family Medicine

## 2013-09-05 ENCOUNTER — Other Ambulatory Visit: Payer: Self-pay | Admitting: Family Medicine

## 2013-09-05 ENCOUNTER — Telehealth: Payer: Self-pay | Admitting: Family Medicine

## 2013-09-05 MED ORDER — METFORMIN HCL ER 750 MG PO TB24: 750.0000 mg | ORAL_TABLET | Freq: Two times a day (BID) | ORAL | Status: DC

## 2013-09-05 NOTE — Telephone Encounter (Signed)
Make sure he is not out but he needs an appointment

## 2013-09-05 NOTE — Telephone Encounter (Signed)
Needs refill Metformin  [facility]

## 2013-09-05 NOTE — Telephone Encounter (Signed)
Refilled med and pt set up an appt for a follow-up.

## 2013-09-05 NOTE — Telephone Encounter (Signed)
LMOM TO CB. CLS 

## 2013-11-11 ENCOUNTER — Other Ambulatory Visit: Payer: Self-pay | Admitting: Medical

## 2013-11-13 NOTE — Telephone Encounter (Signed)
Is this okay to refill? 

## 2013-11-13 NOTE — Telephone Encounter (Signed)
He needs an appointment but don't let him run out

## 2013-11-14 ENCOUNTER — Telehealth: Payer: Self-pay | Admitting: Family Medicine

## 2013-11-14 NOTE — Telephone Encounter (Signed)
Pt called about his metformin refill and about his appt Friday.  It appears his appt for Friday was cancelled 9/1 and rescheduled for Monday via my chart.  Pt didn't remember that.  He cannot come at the time for Monday.  We were able to find him a time on Monday that he could come.  I explained refill for Metformin [facility] with no future refills until after the appt.

## 2013-11-17 ENCOUNTER — Encounter: Payer: Self-pay | Admitting: Family Medicine

## 2013-11-20 ENCOUNTER — Ambulatory Visit (INDEPENDENT_AMBULATORY_CARE_PROVIDER_SITE_OTHER): Payer: No Typology Code available for payment source | Admitting: Family Medicine

## 2013-11-20 ENCOUNTER — Encounter: Payer: Self-pay | Admitting: Family Medicine

## 2013-11-20 VITALS — BP 100/60 | HR 79 | Ht 68.0 in | Wt 225.0 lb

## 2013-11-20 DIAGNOSIS — I10 Essential (primary) hypertension: Secondary | ICD-10-CM

## 2013-11-20 DIAGNOSIS — I2510 Atherosclerotic heart disease of native coronary artery without angina pectoris: Secondary | ICD-10-CM

## 2013-11-20 DIAGNOSIS — E669 Obesity, unspecified: Secondary | ICD-10-CM

## 2013-11-20 DIAGNOSIS — Z720 Tobacco use: Secondary | ICD-10-CM

## 2013-11-20 DIAGNOSIS — E119 Type 2 diabetes mellitus without complications: Secondary | ICD-10-CM

## 2013-11-20 DIAGNOSIS — E785 Hyperlipidemia, unspecified: Secondary | ICD-10-CM

## 2013-11-20 DIAGNOSIS — E1169 Type 2 diabetes mellitus with other specified complication: Secondary | ICD-10-CM

## 2013-11-20 DIAGNOSIS — E1159 Type 2 diabetes mellitus with other circulatory complications: Secondary | ICD-10-CM

## 2013-11-20 LAB — POCT GLYCOSYLATED HEMOGLOBIN (HGB A1C): HEMOGLOBIN A1C: 7.6

## 2013-11-20 NOTE — Progress Notes (Signed)
  Subjective:    Jerome Rodriguez is a 58 y.o. male who presents for follow-up of Type 2 diabetes mellitus.    Home blood sugar records: Patient test at least one time a day  Current symptoms/problems include none Daily foot checks: Any foot concerns: none Last eye exam:  04/15/12 [place]   Medication compliance:he admits to not taking his metformin on a regular basis. He usually takes based on his blood sugars. Current diet: low carb  Current exercise: walking 45 minutes daily. Known diabetic complications: cardiovascular disease Cardiovascular risk factors: advanced age (older than 85 for men, 70 for women), diabetes mellitus, dyslipidemia, hypertension, male gender and obesity (BMI >= 30 kg/m2)   The following portions of the patient's history were reviewed and updated as appropriate: allergies, current medications, past medical history, past social history and problem list.  ROS as in subjective above    Objective:    General appearence: alert, no distress, WD/WN   Lab Review Lab Results  Component Value Date   HGBA1C 6.7 03/21/2013   Lab Results  Component Value Date   CHOL 150 07/14/2013   HDL 34.20* 07/14/2013   LDLCALC 61 07/14/2013   TRIG 273.0* 07/14/2013   CHOLHDL 4 07/14/2013   No results found for: Derl Barrow   Chemistry      Component Value Date/Time   NA 139 07/12/2012 0515   K 4.4 07/12/2012 0515   CL 104 07/12/2012 0515   CO2 28 07/12/2012 0515   BUN 11 07/12/2012 0515   CREATININE 1.00 07/12/2012 0515   CREATININE 0.97 07/16/2011 1058      Component Value Date/Time   CALCIUM 8.4 07/12/2012 0515   ALKPHOS 107 07/14/2013 1224   AST 20 07/14/2013 1224   ALT 26 07/14/2013 1224   BILITOT 0.6 07/14/2013 1224        Chemistry      Component Value Date/Time   NA 139 07/12/2012 0515   K 4.4 07/12/2012 0515   CL 104 07/12/2012 0515   CO2 28 07/12/2012 0515   BUN 11 07/12/2012 0515   CREATININE 1.00 07/12/2012 0515   CREATININE  0.97 07/16/2011 1058      Component Value Date/Time   CALCIUM 8.4 07/12/2012 0515   ALKPHOS 107 07/14/2013 1224   AST 20 07/14/2013 1224   ALT 26 07/14/2013 1224   BILITOT 0.6 07/14/2013 1224      Hemoglobin A1c 7.6  Assessment:  Type 2 diabetes mellitus without complication - Plan: POCT glycosylated hemoglobin (Hb A1C)  ASHD (arteriosclerotic heart disease)  Hypertension associated with diabetes  Hyperlipidemia associated with type 2 diabetes mellitus  Obesity (BMI 30-39.9)  Tobacco abuse        Plan:    1.  Rx changes: none 2.  Education: Reviewed 'ABCs' of diabetes management (respective goals in parentheses):  A1C (<7), blood pressure (<130/80), and cholesterol (LDL <100). 3.  Compliance at present is estimated to be fair. Efforts to improve compliance (if necessary) will be directed at to take his metformin regularly.. 4. Follow up: 4 months   He is to take his medication regularly, continue with his exercise pattern and weight loss. Blood work will be done with his next visit.

## 2013-12-14 ENCOUNTER — Encounter (HOSPITAL_COMMUNITY): Payer: Self-pay | Admitting: Cardiology

## 2014-02-01 ENCOUNTER — Telehealth: Payer: Self-pay | Admitting: Cardiovascular Disease

## 2014-02-01 NOTE — Telephone Encounter (Signed)
I spoke with the pt and made him aware that he can take Theraflu (severe cough and cold) as needed for 2-3 days for cold symptoms. I advised the pt to see his PCP if symptoms did not improve over the next few days or he develops a fever.

## 2014-02-01 NOTE — Telephone Encounter (Signed)
New message    Pt C/O medication issue:  1. Name of Medication: Theraflu- over the counter  2. How are you currently taking this medication (dosage and times per day)?patient has not started yet  - patient has a cold -can he take this medication with his current heart med's.    3. Are you having a reaction (difficulty breathing--STAT)? No - new request.   4. What is your medication issue?  can  He take Theraflu over the counter

## 2014-03-07 ENCOUNTER — Other Ambulatory Visit: Payer: Self-pay | Admitting: *Deleted

## 2014-03-07 ENCOUNTER — Telehealth: Payer: Self-pay | Admitting: Family Medicine

## 2014-03-07 MED ORDER — ATORVASTATIN CALCIUM 20 MG PO TABS: ORAL_TABLET | ORAL | Status: DC

## 2014-03-07 MED ORDER — CLOPIDOGREL BISULFATE 75 MG PO TABS: 75.0000 mg | ORAL_TABLET | Freq: Every day | ORAL | Status: DC

## 2014-03-07 MED ORDER — ENALAPRIL MALEATE 2.5 MG PO TABS: ORAL_TABLET | ORAL | Status: DC

## 2014-03-07 MED ORDER — METOPROLOL SUCCINATE ER 25 MG PO TB24: 25.0000 mg | ORAL_TABLET | Freq: Every day | ORAL | Status: DC

## 2014-03-07 NOTE — Telephone Encounter (Signed)
Pt request Metformin refill with 3 add'l refills to [facility]

## 2014-03-08 ENCOUNTER — Other Ambulatory Visit: Payer: Self-pay

## 2014-03-08 MED ORDER — METFORMIN HCL ER 750 MG PO TB24: ORAL_TABLET | ORAL | Status: DC

## 2014-03-08 NOTE — Telephone Encounter (Signed)
Only filled 90 day pt has appointment 03/26/14

## 2014-03-26 ENCOUNTER — Encounter: Payer: Self-pay | Admitting: Family Medicine

## 2014-03-26 ENCOUNTER — Ambulatory Visit (INDEPENDENT_AMBULATORY_CARE_PROVIDER_SITE_OTHER): Payer: 59 | Admitting: Family Medicine

## 2014-03-26 VITALS — BP 100/50 | HR 60 | Wt 225.0 lb

## 2014-03-26 DIAGNOSIS — E119 Type 2 diabetes mellitus without complications: Secondary | ICD-10-CM

## 2014-03-26 DIAGNOSIS — I10 Essential (primary) hypertension: Secondary | ICD-10-CM | POA: Diagnosis not present

## 2014-03-26 DIAGNOSIS — Z720 Tobacco use: Secondary | ICD-10-CM | POA: Diagnosis not present

## 2014-03-26 DIAGNOSIS — E1169 Type 2 diabetes mellitus with other specified complication: Secondary | ICD-10-CM

## 2014-03-26 DIAGNOSIS — E669 Obesity, unspecified: Secondary | ICD-10-CM | POA: Diagnosis not present

## 2014-03-26 DIAGNOSIS — E785 Hyperlipidemia, unspecified: Secondary | ICD-10-CM

## 2014-03-26 DIAGNOSIS — E1159 Type 2 diabetes mellitus with other circulatory complications: Secondary | ICD-10-CM

## 2014-03-26 LAB — POCT UA - MICROALBUMIN: CREATININE, POC: 100.7 mg/dL

## 2014-03-26 LAB — POCT GLYCOSYLATED HEMOGLOBIN (HGB A1C): Hemoglobin A1C: 7.6

## 2014-03-26 NOTE — Progress Notes (Signed)
  Subjective:    Patient ID: Jerome Rodriguez, male    DOB: [birth year], 59 y.o.   MRN: [id number]  Jerome Rodriguez is a 59 y.o. male who presents for follow-up of Type 2 diabetes mellitus.  Home blood sugar records: Patient test three x a day Current symptoms/problems.None Daily foot checks:   Any foot concerns: none Exercise: walking 2 hrs per day  EYES:04/15/2012 [place] The following portions of the patient's history were reviewed and updated as appropriate: allergies, current medications, past medical history, past social history and problem list.  ROS as in subjective above.     Objective:    Physical Exam Alert and in no distress otherwise not examined.  Blood pressure 100/50, pulse 60, weight 225 lb (102.059 kg), SpO2 96 %.  Lab Review Diabetic Labs Latest Ref Rng 03/26/2014 11/20/2013 07/14/2013 03/21/2013 07/12/2012  HbA1c - 7.6 7.6 - 6.7 -  Chol 0 - 200 mg/dL - - 150 - -  HDL >39.00 mg/dL - - 34.20(L) - -  Calc LDL 0 - 99 mg/dL - - 61 - -  Triglycerides 0.0 - 149.0 mg/dL - - 273.0(H) - -  Creatinine 0.50 - 1.35 mg/dL - - - - 1.00   BP/Weight 03/26/2014 11/20/2013 07/14/2013 09/28/2681 04/05/9620  Systolic BP 297 989 211 941 740  Diastolic BP 50 60 72 68 76  Wt. (Lbs) 225 225 234.8 226 236  BMI 34.22 34.22 33.69 32.43 33.86   Foot/eye exam completion dates 02/23/2012  Eye Exam no diabetic rentinopathy  Foot Form Completion -    [name]  reports that he has been smoking Cigars.  He does not have any smokeless tobacco history on file. He reports that he does not drink alcohol or use illicit drugs. Hemoglobin A1c is 7.6     Assessment & Plan:     Encounter Diagnoses  Name Primary?  . Diabetes mellitus without complication Yes  . Hypertension associated with diabetes   . Hyperlipidemia associated with type 2 diabetes mellitus   . Obesity (BMI 30-39.9)   . Tobacco abuse     1. Rx changes: none 2. Education: Reviewed 'ABCs' of diabetes management (respective  goals in parentheses):  A1C (<7), blood pressure (<130/80), and cholesterol (LDL <100). 3. Compliance at present is estimated to be good. Efforts to improve compliance (if necessary) will be directed at increased exercise.Also encouraged dietary modification and checking blood sugars 2 hours after meals. 4. Follow up: 4 months

## 2014-06-11 ENCOUNTER — Other Ambulatory Visit: Payer: Self-pay | Admitting: Family Medicine

## 2014-07-10 ENCOUNTER — Other Ambulatory Visit: Payer: Self-pay | Admitting: Cardiovascular Disease

## 2014-07-30 ENCOUNTER — Encounter: Payer: Self-pay | Admitting: Family Medicine

## 2014-07-30 ENCOUNTER — Ambulatory Visit (INDEPENDENT_AMBULATORY_CARE_PROVIDER_SITE_OTHER): Payer: 59 | Admitting: Family Medicine

## 2014-07-30 ENCOUNTER — Other Ambulatory Visit: Payer: Self-pay

## 2014-07-30 VITALS — BP 90/50 | HR 70 | Ht 70.8 in | Wt 223.0 lb

## 2014-07-30 DIAGNOSIS — E669 Obesity, unspecified: Secondary | ICD-10-CM | POA: Diagnosis not present

## 2014-07-30 DIAGNOSIS — Z720 Tobacco use: Secondary | ICD-10-CM

## 2014-07-30 DIAGNOSIS — I2510 Atherosclerotic heart disease of native coronary artery without angina pectoris: Secondary | ICD-10-CM | POA: Diagnosis not present

## 2014-07-30 DIAGNOSIS — E118 Type 2 diabetes mellitus with unspecified complications: Secondary | ICD-10-CM

## 2014-07-30 DIAGNOSIS — Z23 Encounter for immunization: Secondary | ICD-10-CM | POA: Diagnosis not present

## 2014-07-30 DIAGNOSIS — E785 Hyperlipidemia, unspecified: Secondary | ICD-10-CM

## 2014-07-30 DIAGNOSIS — E1169 Type 2 diabetes mellitus with other specified complication: Secondary | ICD-10-CM | POA: Diagnosis not present

## 2014-07-30 DIAGNOSIS — E1159 Type 2 diabetes mellitus with other circulatory complications: Secondary | ICD-10-CM

## 2014-07-30 DIAGNOSIS — I10 Essential (primary) hypertension: Secondary | ICD-10-CM

## 2014-07-30 DIAGNOSIS — I152 Hypertension secondary to endocrine disorders: Secondary | ICD-10-CM

## 2014-07-30 LAB — POCT GLYCOSYLATED HEMOGLOBIN (HGB A1C): Hemoglobin A1C: 7.6

## 2014-07-30 MED ORDER — LANCETS MISC. MISC: 1.0000 | Freq: Two times a day (BID) | Status: DC

## 2014-07-30 MED ORDER — ENALAPRIL MALEATE 2.5 MG PO TABS: 2.5000 mg | ORAL_TABLET | Freq: Every day | ORAL | Status: DC

## 2014-07-30 MED ORDER — METOPROLOL SUCCINATE ER 25 MG PO TB24: 25.0000 mg | ORAL_TABLET | Freq: Every day | ORAL | Status: DC

## 2014-07-30 MED ORDER — ATORVASTATIN CALCIUM 20 MG PO TABS: 20.0000 mg | ORAL_TABLET | Freq: Every day | ORAL | Status: DC

## 2014-07-30 MED ORDER — METFORMIN HCL ER 750 MG PO TB24: 750.0000 mg | ORAL_TABLET | Freq: Two times a day (BID) | ORAL | Status: DC

## 2014-07-30 MED ORDER — GLUCOSE BLOOD VI STRP: 1.0000 | ORAL_STRIP | Freq: Two times a day (BID) | Status: DC

## 2014-07-30 MED ORDER — NITROGLYCERIN 0.4 MG SL SUBL: 0.4000 mg | SUBLINGUAL_TABLET | SUBLINGUAL | Status: DC | PRN

## 2014-07-30 NOTE — Progress Notes (Signed)
  Subjective:    Patient ID: Jerome Rodriguez, male    DOB: [birth year], 59 y.o.   MRN: [id number]  Jerome Rodriguez is a 59 y.o. male who presents for follow-up of Type 2 diabetes mellitus.  Home blood sugar records: Patient test BID. He states his eating habits are quite good Current symptoms/problems include None Daily foot checks: yes  Any foot concerns: none Exercise: walking 2 hrs a day 7 days a week  Eyes:11/03/2013  He has had no difficulty with chest pain, shortness of breath, PND The following portions of the patient's history were reviewed and updated as appropriate: allergies, current medications, past medical history, past social history and problem list.  ROS as in subjective above. Health maintenance and immunizations were also reviewed    Objective:    Physical Exam Alert and in no distress otherwise not examined. Cardiac exam shows a regular rhythm without murmurs or gallops. Lungs are clear to auscultation. Foot exam completed and is negative.   Lab Review Diabetic Labs Latest Ref Rng 03/26/2014 11/20/2013 07/14/2013 03/21/2013 07/12/2012  HbA1c - 7.6 7.6 - 6.7 -  Chol 0 - 200 mg/dL - - 150 - -  HDL >39.00 mg/dL - - 34.20(L) - -  Calc LDL 0 - 99 mg/dL - - 61 - -  Triglycerides 0.0 - 149.0 mg/dL - - 273.0(H) - -  Creatinine 0.50 - 1.35 mg/dL - - - - 1.00   BP/Weight 03/26/2014 11/20/2013 07/14/2013 5/68/1275 01/12/15  Systolic BP 494 496 759 163 846  Diastolic BP 50 60 72 68 76  Wt. (Lbs) 225 225 234.8 226 236  BMI 34.22 34.22 33.69 32.43 33.86   Foot/eye exam completion dates 02/23/2012  Eye Exam no diabetic rentinopathy  Foot Form Completion -   Hemoglobin A1c 7.6 Jerome Rodriguez  reports that he has been smoking Cigars.  He does not have any smokeless tobacco history on file. He reports that he does not drink alcohol or use illicit drugs.     Assessment & Plan:  Type 2 diabetes mellitus with complication - Plan: POCT glycosylated hemoglobin (Hb A1C), CBC with  Differential/Platelet, Comprehensive metabolic panel, Lipid panel, enalapril (VASOTEC) 2.5 MG tablet, glucose blood (TRUETEST TEST) test strip, Lancets Misc. MISC, metFORMIN (GLUCOPHAGE-XR) 750 MG 24 hr tablet, HM Diabetes Foot Exam, CANCELED: POCT UA - Microalbumin  Obesity (BMI 30-39.9) - Plan: CBC with Differential/Platelet, Comprehensive metabolic panel, Lipid panel  Hypertension associated with diabetes - Plan: CBC with Differential/Platelet, Comprehensive metabolic panel, enalapril (VASOTEC) 2.5 MG tablet, metoprolol succinate (TOPROL-XL) 25 MG 24 hr tablet  Hyperlipidemia associated with type 2 diabetes mellitus - Plan: Lipid panel, atorvastatin (LIPITOR) 20 MG tablet  Atherosclerosis of native coronary artery of native heart without angina pectoris - Plan: CBC with Differential/Platelet, Comprehensive metabolic panel, Lipid panel, metoprolol succinate (TOPROL-XL) 25 MG 24 hr tablet, nitroGLYCERIN (NITROSTAT) 0.4 MG SL tablet  Tobacco abuse  Need for Zostavax administration - Plan: Varicella-zoster vaccine subcutaneous  Need for prophylactic vaccination against Streptococcus pneumoniae (pneumococcus) - Plan: Pneumococcal conjugate vaccine 13-valent    1. Rx changes: none 2. Education: Reviewed 'ABCs' of diabetes management (respective goals in parentheses):  A1C (<7), blood pressure (<130/80), and cholesterol (LDL <100). 3. Compliance at present is estimated to be good. Efforts to improve compliance (if necessary) will be directed at No change. 4. Follow up: 4 months

## 2014-07-31 LAB — CBC WITH DIFFERENTIAL/PLATELET
BASOS ABS: 0.1 10*3/uL (ref 0.0–0.1)
Basophils Relative: 1 % (ref 0–1)
EOS PCT: 3 % (ref 0–5)
Eosinophils Absolute: 0.3 10*3/uL (ref 0.0–0.7)
HCT: 41.5 % (ref 39.0–52.0)
HEMOGLOBIN: 14.4 g/dL (ref 13.0–17.0)
LYMPHS PCT: 29 % (ref 12–46)
Lymphs Abs: 2.6 10*3/uL (ref 0.7–4.0)
MCH: 29.7 pg (ref 26.0–34.0)
MCHC: 34.7 g/dL (ref 30.0–36.0)
MCV: 85.6 fL (ref 78.0–100.0)
MPV: 9.6 fL (ref 8.6–12.4)
Monocytes Absolute: 0.6 10*3/uL (ref 0.1–1.0)
Monocytes Relative: 7 % (ref 3–12)
Neutro Abs: 5.5 10*3/uL (ref 1.7–7.7)
Neutrophils Relative %: 60 % (ref 43–77)
PLATELETS: 238 10*3/uL (ref 150–400)
RBC: 4.85 MIL/uL (ref 4.22–5.81)
RDW: 13.9 % (ref 11.5–15.5)
WBC: 9.1 10*3/uL (ref 4.0–10.5)

## 2014-07-31 LAB — LIPID PANEL
CHOLESTEROL: 154 mg/dL (ref 125–200)
HDL: 36 mg/dL — ABNORMAL LOW (ref >=40)
LDL CALC: 89 mg/dL (ref <130)
Total CHOL/HDL Ratio: 4.3 Ratio (ref <=5.0)
Triglycerides: 143 mg/dL (ref <150)
VLDL: 29 mg/dL (ref <30)

## 2014-07-31 LAB — COMPREHENSIVE METABOLIC PANEL
ALBUMIN: 4.3 g/dL (ref 3.6–5.1)
ALT: 16 U/L (ref 9–46)
AST: 17 U/L (ref 10–35)
Alkaline Phosphatase: 118 U/L — ABNORMAL HIGH (ref 40–115)
BUN: 14 mg/dL (ref 7–25)
CO2: 29 mmol/L (ref 20–31)
Calcium: 9.8 mg/dL (ref 8.6–10.3)
Chloride: 98 mmol/L (ref 98–110)
Creat: 0.97 mg/dL (ref 0.70–1.25)
Glucose, Bld: 142 mg/dL — ABNORMAL HIGH (ref 65–99)
Potassium: 4.4 mmol/L (ref 3.5–5.3)
SODIUM: 137 mmol/L (ref 135–146)
TOTAL PROTEIN: 6.7 g/dL (ref 6.1–8.1)
Total Bilirubin: 0.7 mg/dL (ref 0.2–1.2)

## 2014-09-24 ENCOUNTER — Telehealth: Payer: Self-pay | Admitting: Family Medicine

## 2014-09-24 NOTE — Telephone Encounter (Signed)
Pt called for refill of Metformin.  I advised him he has a refill with [facility].  He states he called up there and he does not. So I told him I would call up there and give refill.   So I called pharm and spoke with Caryl Pina and she said he does and she would get it ready for him.

## 2014-09-25 ENCOUNTER — Telehealth: Payer: Self-pay | Admitting: Family Medicine

## 2014-09-25 NOTE — Telephone Encounter (Signed)
done

## 2014-09-25 NOTE — Telephone Encounter (Signed)
Pt called and stated that he went to pharmacy and they told him what kind of meter would be paid for by his insurance company. He needs a rx for One Touch Ultra meter and Strips sent to [place] pt pharmacy.

## 2014-10-10 ENCOUNTER — Other Ambulatory Visit: Payer: Self-pay | Admitting: Cardiovascular Disease

## 2014-11-08 ENCOUNTER — Telehealth: Payer: Self-pay | Admitting: Cardiovascular Disease

## 2014-11-08 MED ORDER — CLOPIDOGREL BISULFATE 75 MG PO TABS: 75.0000 mg | ORAL_TABLET | Freq: Every day | ORAL | Status: DC

## 2014-11-08 NOTE — Telephone Encounter (Signed)
° °*  STAT* If patient is at the pharmacy, call can be transferred to refill team.   1. Which medications need to be refilled? (please list name of each medication and dose if known)  Clopidogrel 75mg    2. Which pharmacy/location (including street and city if local pharmacy) is medication to be sent to?[facility]  3. Do they need a 30 day or 90 day supply? 90 day

## 2015-01-09 MED FILL — CLOPIDOGREL 75 MG TABLET: 75 | 30 days supply | Qty: 30 | Fill #2

## 2015-01-17 MED FILL — ATORVASTATIN 20 MG TABLET: 20 | 30 days supply | Qty: 30 | Fill #6

## 2015-01-17 MED FILL — ENALAPRIL MALEATE 2.5 MG TA: 2.5 | 30 days supply | Qty: 30 | Fill #6

## 2015-01-17 MED FILL — METOPROLOL SUCC ER 25 MG TA: 25 | 30 days supply | Qty: 30 | Fill #6

## 2015-01-18 MED FILL — METFORMIN HCL ER 750 MG TAB: 750 | 30 days supply | Qty: 60 | Fill #4

## 2015-01-30 ENCOUNTER — Encounter: Payer: Self-pay | Admitting: Family Medicine

## 2015-01-30 ENCOUNTER — Ambulatory Visit (INDEPENDENT_AMBULATORY_CARE_PROVIDER_SITE_OTHER): Payer: BLUE CROSS/BLUE SHIELD | Admitting: Family Medicine

## 2015-01-30 VITALS — BP 108/76 | HR 76 | Wt 222.0 lb

## 2015-01-30 DIAGNOSIS — Z794 Long term (current) use of insulin: Secondary | ICD-10-CM | POA: Diagnosis not present

## 2015-01-30 DIAGNOSIS — E669 Obesity, unspecified: Secondary | ICD-10-CM

## 2015-01-30 DIAGNOSIS — E785 Hyperlipidemia, unspecified: Secondary | ICD-10-CM | POA: Diagnosis not present

## 2015-01-30 DIAGNOSIS — E1169 Type 2 diabetes mellitus with other specified complication: Secondary | ICD-10-CM | POA: Diagnosis not present

## 2015-01-30 DIAGNOSIS — E118 Type 2 diabetes mellitus with unspecified complications: Secondary | ICD-10-CM

## 2015-01-30 DIAGNOSIS — I10 Essential (primary) hypertension: Secondary | ICD-10-CM | POA: Diagnosis not present

## 2015-01-30 DIAGNOSIS — I2510 Atherosclerotic heart disease of native coronary artery without angina pectoris: Secondary | ICD-10-CM

## 2015-01-30 DIAGNOSIS — E1159 Type 2 diabetes mellitus with other circulatory complications: Secondary | ICD-10-CM | POA: Diagnosis not present

## 2015-01-30 LAB — POCT GLYCOSYLATED HEMOGLOBIN (HGB A1C): Hemoglobin A1C: 7.9

## 2015-01-30 MED ORDER — SAXAGLIPTIN-METFORMIN ER 5-1000 MG PO TB24: 1.0000 | ORAL_TABLET | Freq: Every day | ORAL | Status: DC

## 2015-01-30 NOTE — Progress Notes (Signed)
Patient ID: Jerome Rodriguez, male   DOB: 1955/09/06, 60 y.o.   MRN: [id number]  Subjective:    Patient ID: Jerome Rodriguez, male    DOB: [birth year], 60 y.o.   MRN: [id number]  IWAO Rodriguez is a 60 y.o. male who presents for follow-up of Type 2 diabetes mellitus.He does have underlying heart disease however his had no chest pain, shortness breath, PND or DOE.  Patient is checking home blood sugars.   Home blood sugar records: Per his notes are in a good range How often is blood sugars being checked: Every other day Current symptoms/problems include none and have been stable. Daily foot checks: YES Any foot concerns:  Last eye exam: 37yrs He plans to set up an appointment with his ophthalmologist. Exercise:Walking Daily 1 hr 78min The following portions of the patient's history were reviewed and updated as appropriate: allergies, current medications, past medical history, past social history and problem list.  ROS as in subjective above.     Objective:    Physical Exam Alert and in no distress otherwise not examined.  Blood pressure 108/76, pulse 76, weight 222 lb (100.699 kg), SpO2 94 %.  Lab Review Diabetic Labs Latest Ref Rng 07/30/2014 03/26/2014 11/20/2013 07/14/2013 03/21/2013  HbA1c - 7.6 7.6 7.6 - 6.7  Chol 125 - 200 mg/dL 154 - - 150 -  HDL >=40 mg/dL 36(L) - - 34.20(L) -  Calc LDL <130 mg/dL 89 - - 61 -  Triglycerides <150 mg/dL 143 - - 273.0(H) -  Creatinine 0.70 - 1.25 mg/dL 0.97 - - - -   BP/Weight 01/30/2015 07/30/2014 03/26/2014 11/20/2013 [id number]  Systolic BP 123XX123 90 123XX123 123XX123 123456  Diastolic BP 76 50 50 60 72  Wt. (Lbs) 222 223 225 225 234.8  BMI 31.15 31.29 34.22 34.22 33.69   Foot/eye exam completion dates 07/30/2014 02/23/2012  Eye Exam - no diabetic rentinopathy  Foot Form Completion Done -  Hemoglobin A1c is 7.9  Sal[place]  reports that he has been smoking Cigars.  He does not have any smokeless tobacco history on file. He reports that he does not  drink alcohol or use illicit drugs.     Assessment & Plan:    Type 2 diabetes mellitus with complication, with long-term current use of insulin (HCC) - Plan: POCT glycosylated hemoglobin (Hb A1C), Saxagliptin-Metformin (KOMBIGLYZE XR) 05-998 MG TB24  ASHD (arteriosclerotic heart disease)  Type 2 diabetes mellitus with complication, without long-term current use of insulin (HCC)  Hypertension associated with diabetes ([place])  Hyperlipidemia associated with type 2 diabetes mellitus ([place])  Obesity (BMI 30-39.9)   1. Rx changes: he will stop taking his metformin and switch to [place]. 2. Education: Reviewed 'ABCs' of diabetes management (respective goals in parentheses):  A1C (<7), blood pressure (<130/80), and cholesterol (LDL <100). 3. Compliance at present is estimated to be fair. Efforts to improve compliance (if necessary) will be directed at increased exercise. 4. Follow up: 4 months I had a long discussion with him concerning his diabetes not being adequately controlled. I will add an SGLT2  to his regimen.

## 2015-01-31 ENCOUNTER — Telehealth: Payer: Self-pay | Admitting: Family Medicine

## 2015-01-31 NOTE — Telephone Encounter (Signed)
Pt states insurance company will not pay for test strips any longer.  I called [place] indeed his insurance will not cover any kind of test strips. [facility] offers free meter and cash pay for strips is only $22.   Called pt & informed, he will pick up generic meter

## 2015-02-07 MED FILL — CLOPIDOGREL 75 MG TABLET: 75 | 30 days supply | Qty: 30 | Fill #3

## 2015-02-15 MED FILL — ATORVASTATIN 20 MG TABLET: 20 | 30 days supply | Qty: 30 | Fill #7

## 2015-02-15 MED FILL — ENALAPRIL MALEATE 2.5 MG TA: 2.5 | 30 days supply | Qty: 30 | Fill #7

## 2015-02-15 MED FILL — METOPROLOL SUCC ER 25 MG TA: 25 | 30 days supply | Qty: 30 | Fill #7

## 2015-02-21 ENCOUNTER — Encounter: Payer: Self-pay | Admitting: Cardiovascular Disease

## 2015-02-21 ENCOUNTER — Ambulatory Visit (INDEPENDENT_AMBULATORY_CARE_PROVIDER_SITE_OTHER): Payer: BLUE CROSS/BLUE SHIELD | Admitting: Cardiovascular Disease

## 2015-02-21 VITALS — BP 118/68 | HR 80 | Ht 70.8 in | Wt 220.6 lb

## 2015-02-21 DIAGNOSIS — I10 Essential (primary) hypertension: Secondary | ICD-10-CM

## 2015-02-21 DIAGNOSIS — I2510 Atherosclerotic heart disease of native coronary artery without angina pectoris: Secondary | ICD-10-CM

## 2015-02-21 DIAGNOSIS — E1169 Type 2 diabetes mellitus with other specified complication: Secondary | ICD-10-CM

## 2015-02-21 DIAGNOSIS — R0609 Other forms of dyspnea: Secondary | ICD-10-CM | POA: Diagnosis not present

## 2015-02-21 DIAGNOSIS — E1159 Type 2 diabetes mellitus with other circulatory complications: Secondary | ICD-10-CM

## 2015-02-21 DIAGNOSIS — E785 Hyperlipidemia, unspecified: Secondary | ICD-10-CM

## 2015-02-21 MED ORDER — ATORVASTATIN CALCIUM 20 MG PO TABS: 20.0000 mg | ORAL_TABLET | Freq: Every day | ORAL | Status: DC

## 2015-02-21 MED ORDER — METOPROLOL SUCCINATE ER 25 MG PO TB24: 25.0000 mg | ORAL_TABLET | Freq: Every day | ORAL | Status: DC

## 2015-02-21 MED ORDER — NITROGLYCERIN 0.4 MG SL SUBL: 0.4000 mg | SUBLINGUAL_TABLET | SUBLINGUAL | Status: DC | PRN

## 2015-02-21 MED ORDER — ENALAPRIL MALEATE 2.5 MG PO TABS: 2.5000 mg | ORAL_TABLET | Freq: Every day | ORAL | Status: DC

## 2015-02-21 MED ORDER — CLOPIDOGREL BISULFATE 75 MG PO TABS: 75.0000 mg | ORAL_TABLET | Freq: Every day | ORAL | Status: DC

## 2015-02-21 NOTE — Patient Instructions (Signed)

## 2015-02-21 NOTE — Progress Notes (Signed)
Cardiology Office Note Date:  02/21/2015   ID:  Jerome Rodriguez, DOB 06-13-1955, MRN [id number]  PCP:  Wyatt Haste, MD  Cardiologist:  Sherren Mocha, MD    Chief Complaint  Patient presents with  . Shortness of Breath   History of Present Illness: Jerome Rodriguez is a 60 y.o. male who presents for followup evaluation. The patient has been followed for coronary artery disease. He initially underwent stenting of the LAD in 2004. He presented in 2014 with symptoms of unstable angina and was found to have 99% stenosis within one of his LAD stents. He was treated with PCI using a single drug-eluting stent.  The patient is not had any recent cardiac symptoms. He specifically denies chest pain or chest pressure. He does admit to shortness of breath with activity, but he continues to smoke cigars. He denies edema, orthopnea, or PND. He has started walking again for exercise and he is not limited by any cardiopulmonary symptoms. He is compliant with his medications.  Past Medical History  Diagnosis Date  . CAD (coronary artery disease)     a. NSTEMI 10/04 => LHC: mLAD 99%=> Taxus DES to mLAD and Taxus DES to dLAD;  b. ETT-Myoview 11/2011: normal, EF 73%, no ischemia; c. 07/2012 Cath/PCI: LM <10, LAD 90 ISRp/99 ISRd (3.0x38 Promus DES), LCX 61m 30-40d, RCA 50-60p, 30d, EF 55-65%.  . Dyslipidemia   . HTN (hypertension)   . Obesity   . Diabetes mellitus   . ED (erectile dysfunction)   . Smoker     CIGARS  . Abnormal chest CT     a. 07/2012: scattered bilat noncalcified pulm nodules ranging in size from a few mm to a max of 82min LLL - rec f/u in 3-6 mos.    Past Surgical History  Procedure Laterality Date  . Appendectomy    . Right shoulder surgery    . Arthroscopic knee surgery    . Coronary stent placement    . Left heart catheterization with coronary angiogram N/A 07/11/2012    Procedure: LEFT HEART CATHETERIZATION WITH CORONARY ANGIOGRAM;  Surgeon: Peter M JoMartinique MD;  [facility];  Service: Cardiovascular;  Laterality: N/A;    Current Outpatient Prescriptions  Medication Sig Dispense Refill  . aspirin 81 MG EC tablet Take 1 tablet (81 mg total) by mouth daily.    . Marland Kitchentorvastatin (LIPITOR) 20 MG tablet Take 1 tablet (20 mg total) by mouth daily. 90 tablet 3  . Blood Glucose Monitoring Suppl (BLOOD GLUCOSE METER) kit Pt wants a Freestyle meter, lancets and test strips. 1 each 0  . clopidogrel (PLAVIX) 75 MG tablet Take 1 tablet (75 mg total) by mouth daily. 90 tablet 3  . enalapril (VASOTEC) 2.5 MG tablet Take 1 tablet (2.5 mg total) by mouth daily. 90 tablet 3  . glucose blood (TRUETEST TEST) test strip 1 each by Other route 2 (two) times daily. Use as instructed (This is for Ture test DX: E11.9) 100 each 11  . Lancets Misc. MISC 1 each by Does not apply route 2 (two) times daily. (This is for ture test DX: E11.9) 100 each 11  . loratadine (CLARITIN) 10 MG tablet Take 10 mg by mouth daily.    . metFORMIN (GLUCOPHAGE-XR) 750 MG 24 hr tablet Take 1 tablet by mouth 2 (two) times daily.  1  . metoprolol succinate (TOPROL-XL) 25 MG 24 hr tablet Take 1 tablet (25 mg total) by mouth daily. 90 tablet 3  .  Multiple Vitamin (MULTIVITAMIN) tablet Take 1 tablet by mouth daily.      . nitroGLYCERIN (NITROSTAT) 0.4 MG SL tablet Place 1 tablet (0.4 mg total) under the tongue every 5 (five) minutes x 3 doses as needed for chest pain. 25 tablet 3   No current facility-administered medications for this visit.    Allergies:   Effient; Lactose intolerance (gi); and Saxagliptin-metformin er   Social History:  The patient  reports that he has been smoking Cigars.  He does not have any smokeless tobacco history on file. He reports that he does not drink alcohol or use illicit drugs.   Family History:  The patient's  family history includes Diabetic kidney disease in his father and mother; Heart attack in his father and mother; Heart failure in his father.   ROS:   Please see the history of present illness.  Otherwise, review of systems is positive for DOE, snoring.  All other systems are reviewed and negative.   PHYSICAL EXAM: VS:  BP 118/68 mmHg  Pulse 80  Ht 5' 10.8" (1.798 m)  Wt 100.064 kg (220 lb 9.6 oz)  BMI 30.95 kg/m2 , BMI Body mass index is 30.95 kg/(m^2). GEN: Well nourished, well developed, in no acute distress HEENT: normal Neck: no JVD, no masses. No carotid bruits Cardiac: RRR without murmur or gallop                Respiratory:  clear to auscultation bilaterally, normal work of breathing GI: soft, nontender, nondistended, + BS MS: no deformity or atrophy Ext: no pretibial edema, pedal pulses 2+= bilaterally Skin: warm and dry, no rash Neuro:  Strength and sensation are intact Psych: euthymic mood, full affect  EKG:  EKG is ordered today. The ekg ordered today shows NSR 74 bpm, within normal limits  Recent Labs: 07/30/2014: ALT 16; BUN 14; Creat 0.97; Hemoglobin 14.4; Platelets 238; Potassium 4.4; Sodium 137   Lipid Panel     Component Value Date/Time   CHOL 154 07/30/2014 0001   TRIG 143 07/30/2014 0001   HDL 36* 07/30/2014 0001   CHOLHDL 4.3 07/30/2014 0001   VLDL 29 07/30/2014 0001   LDLCALC 89 07/30/2014 0001      Wt Readings from Last 3 Encounters:  02/21/15 100.064 kg (220 lb 9.6 oz)  01/30/15 100.699 kg (222 lb)  07/30/14 101.152 kg (223 lb)     Cardiac Studies Reviewed: Cardiac cath 07-11-2012: Hemodynamics: AO 111/71 with a mean of 89 mmHg LV 110/15 mmHg  Coronary angiography: Coronary dominance: right  Left mainstem: The left main has mild irregularities less than 10%.  Left anterior descending (LAD): The left anterior descending artery is moderately calcified in the proximal vessel. In the mid vessel at the site of a previous stent there is a 90% in-stent restenosis. This lesion is at the takeoff of the second diagonal. Further distally there is a second stent with a 99% stenosis  within the stent. The first diagonal is without significant disease. There is a 60-70% stenosis at the takeoff of the second diagonal.  Left circumflex (LCx): The left circumflex is a large vessel which gives rise to 2 small marginal branches and then terminates in a large posterior lateral branch. In the mid vessel there is diffuse disease up to 20%. The distal vessel has 30-40% disease.  Right coronary artery (RCA): The right coronary is a codominant vessel. There is segmental 50-60% plaque in the proximal vessel there is 30% disease at the crux.  Left ventriculography:  Left ventricular systolic function is normal, LVEF is estimated at 55-65%, there is no significant mitral regurgitation   PCI Note: Following the diagnostic procedure, the decision was made to proceed with PCI of the LAD. Effient 60 mg was given orally. Weight-based bivalirudin was given for anticoagulation. Once a therapeutic ACT was achieved, a 6 [place] XB LAD 3.5 guide catheter was inserted. A A pro-water coronary guidewire was used to cross the lesion. The lesion was predilated with a 2.5 mm balloon. there was still significant waisting of the balloon in the mid vessel. We predilated with a 3.0 mm noncompliant balloon to make sure that this lesion would yield. The lesion was then stented with a 3.0 x 38 mm Promus stent. The stent was postdilated with a 3.25 mm noncompliant balloon. The more proximal margin of the stent prior to the second diagonal was postdilated with a 4.0 mm noncompliant balloon. Following PCI, there was 0% residual stenosis and TIMI-3 flow. There was some plaque shift into the ostium of the second diagonal but there was still excellent TIMI-3 flow in this vessel. The patient was pain free. Final angiography confirmed an excellent result. The patient tolerated the procedure well. There were no immediate procedural complications. A TR band was used for radial hemostasis. The patient was transferred to the  post catheterization recovery area for further monitoring.  PCI Data: Vessel - LAD/Segment - mid to distal Percent Stenosis (pre) 95% TIMI-flow 2 Stent 3.0 x 38 mm Promus Percent Stenosis (post) 0% TIMI-flow (post) 3  Final Conclusions:  1. Single vessel obstructive coronary disease with in-stent restenosis in the mid and distal LAD. 2. Normal LV function. 3. Successful stenting of the tandem lesions in the mid and distal LAD with a long drug-eluting stent.   Recommendations:  Dual antiplatelet therapy for one year. Aggressive risk factor modification.  ASSESSMENT AND PLAN: 1.  CAD, native vessel: s/p PCI last in 2014: no anginal symptoms. Current medicines are reviewed and no changes recommended. He continues on dual antiplatelet therapy with aspirin and clopidogrel. Secondary risk reduction measures as outlined. Tobacco cessation counseling done. Lifestyle modification reviewed.  2. Hyperlipidemia: last lipids July 2016 reviewed. Continue atorvastatin, lifestyle modification reviewed.  3. Hypertension, essential: continue enalapril and metoprolol succinate  4. Type II DM: managed by Dr Redmond School. Most recent labs, HgB A1C reviewed. Again reviewed lifestyle modification.   Current medicines are reviewed with the patient today.  The patient does not have concerns regarding medicines.  Labs/ tests ordered today include:   Orders Placed This Encounter  Procedures  . EKG 12-Lead   Disposition:   FU one year  Signed, Sherren Mocha, MD  02/21/2015 1:38 PM    [facility] [address], [place], [place]  [address] Phone: [phone]; Fax: [phone]

## 2015-03-08 MED FILL — CLOPIDOGREL 75 MG TABLET: 75 | 90 days supply | Qty: 90 | Fill #0

## 2015-03-11 MED FILL — METFORMIN HCL ER 750 MG TAB: 750 | 30 days supply | Qty: 60 | Fill #5

## 2015-03-18 MED FILL — METOPROLOL SUCC ER 25 MG TA: 25 | 90 days supply | Qty: 90 | Fill #0

## 2015-03-18 MED FILL — ENALAPRIL MALEATE 2.5 MG TA: 2.5 | 90 days supply | Qty: 90 | Fill #0

## 2015-03-18 MED FILL — ATORVASTATIN 20 MG TABLET: 20 | 90 days supply | Qty: 90 | Fill #0

## 2015-04-12 ENCOUNTER — Telehealth: Payer: Self-pay | Admitting: Family Medicine

## 2015-04-12 MED ORDER — METFORMIN HCL ER 750 MG PO TB24: 750.0000 mg | ORAL_TABLET | Freq: Two times a day (BID) | ORAL | Status: DC

## 2015-04-12 MED FILL — METFORMIN HCL ER 750 MG TAB: 750 | 90 days supply | Qty: 180 | Fill #0

## 2015-04-12 NOTE — Telephone Encounter (Signed)
Pt called and stated that he stopped taking the kombiglyze because he was having issues with side effects. He states he had upset stomach and difficulty breathing. He started taking the metformin again. He is now completely out of Metformin and is request a refill be sent to [place].

## 2015-05-29 ENCOUNTER — Encounter: Payer: Self-pay | Admitting: Family Medicine

## 2015-05-29 ENCOUNTER — Ambulatory Visit (INDEPENDENT_AMBULATORY_CARE_PROVIDER_SITE_OTHER): Payer: BLUE CROSS/BLUE SHIELD | Admitting: Family Medicine

## 2015-05-29 VITALS — BP 96/58 | HR 88 | Ht 71.0 in | Wt 219.0 lb

## 2015-05-29 DIAGNOSIS — I2510 Atherosclerotic heart disease of native coronary artery without angina pectoris: Secondary | ICD-10-CM

## 2015-05-29 DIAGNOSIS — E669 Obesity, unspecified: Secondary | ICD-10-CM

## 2015-05-29 DIAGNOSIS — E785 Hyperlipidemia, unspecified: Secondary | ICD-10-CM | POA: Diagnosis not present

## 2015-05-29 DIAGNOSIS — E1169 Type 2 diabetes mellitus with other specified complication: Secondary | ICD-10-CM | POA: Diagnosis not present

## 2015-05-29 DIAGNOSIS — E118 Type 2 diabetes mellitus with unspecified complications: Secondary | ICD-10-CM | POA: Diagnosis not present

## 2015-05-29 DIAGNOSIS — I10 Essential (primary) hypertension: Secondary | ICD-10-CM

## 2015-05-29 DIAGNOSIS — E1159 Type 2 diabetes mellitus with other circulatory complications: Secondary | ICD-10-CM

## 2015-05-29 LAB — POCT GLYCOSYLATED HEMOGLOBIN (HGB A1C): Hemoglobin A1C: 7.9

## 2015-05-29 NOTE — Progress Notes (Signed)
  Subjective:    Patient ID: Jerome Rodriguez, male    DOB: [birth year], 60 y.o.   MRN: [id number]  Jerome Rodriguez is a 60 y.o. male who presents for follow-up of Type 2 diabetes mellitus.  Patient is checking home blood sugars.   Home blood sugar record high 389 low 113 How often is blood sugars being checked: BID Current symptoms/problems none Daily foot checks: yes   Any foot concerns: none Last eye exam:  [place] 04/15/12 Exercise: 2 hours walking a day, 40 mins riding bike a day. He started his new exercise regimen 10 days ago.  he has had no difficulty with chest pain, shortness of breath , DOE. The following portions of the patient's history were reviewed and updated as appropriate: allergies, current medications, past medical history, past social history and problem list.  ROS as in subjective above.     Objective:    Physical Exam Alert and in no distress otherwise not examined.   Lab Review Diabetic Labs Latest Ref Rng 01/30/2015 07/30/2014 03/26/2014 11/20/2013 07/14/2013  HbA1c - 7.9 7.6 7.6 7.6 -  Chol 125 - 200 mg/dL - 154 - - 150  HDL >=40 mg/dL - 36(L) - - 34.20(L)  Calc LDL <130 mg/dL - 89 - - 61  Triglycerides <150 mg/dL - 143 - - 273.0(H)  Creatinine 0.70 - 1.25 mg/dL - 0.97 - - -   BP/Weight 02/21/2015 01/30/2015 07/30/2014 03/26/2014 [id number]  Systolic BP 123456 123XX123 90 123XX123 123XX123  Diastolic BP 68 76 50 50 60  Wt. (Lbs) 220.6 222 223 225 225  BMI 30.95 31.15 31.29 34.22 34.22   Foot/eye exam completion dates 07/30/2014 02/23/2012  Eye Exam - no diabetic rentinopathy  Foot Form Completion Done -    A1c is 7.9 [name]  reports that he has been smoking Cigars.  He does not have any smokeless tobacco history on file. He reports that he does not drink alcohol or use illicit drugs.     Assessment & Plan:    Obesity (BMI 30-39.9)  Hyperlipidemia associated with type 2 diabetes mellitus ([place])  Hypertension associated with diabetes ([place])  Atherosclerosis of  native coronary artery of native heart without angina pectoris  Type 2 diabetes mellitus with complication, without long-term current use of insulin ([place])   1. Rx changes: none 2. Education: Reviewed 'ABCs' of diabetes management (respective goals in parentheses):  A1C (<7), blood pressure (<130/80), and cholesterol (LDL <100). 3. Compliance at present is estimated to be fair. Efforts to improve compliance (if necessary) will be directed at dietary modifications:  reduce carbohydrates. 4. Follow up: 4 months  encouraged him to continue with his new exercise regimen and look at cutting back on carbohydrates.

## 2015-06-06 MED FILL — CLOPIDOGREL 75 MG TABLET: 75 | 90 days supply | Qty: 90 | Fill #1

## 2015-06-17 MED FILL — ENALAPRIL MALEATE 2.5 MG TA: 2.5 | 30 days supply | Qty: 30 | Fill #1

## 2015-06-17 MED FILL — METOPROLOL SUCC ER 25 MG TA: 25 | 30 days supply | Qty: 30 | Fill #1

## 2015-06-17 MED FILL — ATORVASTATIN 20 MG TABLET: 20 | 90 days supply | Qty: 90 | Fill #1

## 2015-07-17 MED FILL — METFORMIN HCL ER 750 MG TAB: 750 | 30 days supply | Qty: 60 | Fill #1

## 2015-07-17 MED FILL — METOPROLOL SUCC ER 25 MG TA: 25 | 30 days supply | Qty: 30 | Fill #2

## 2015-07-17 MED FILL — ENALAPRIL MALEATE 2.5 MG TA: 2.5 | 30 days supply | Qty: 30 | Fill #2

## 2015-08-16 MED FILL — ENALAPRIL MALEATE 2.5 MG TA: 2.5 | 30 days supply | Qty: 30 | Fill #3

## 2015-08-16 MED FILL — METFORMIN HCL ER 750 MG TAB: 750 | 30 days supply | Qty: 60 | Fill #2

## 2015-08-16 MED FILL — METOPROLOL SUCC ER 25 MG TA: 25 | 30 days supply | Qty: 30 | Fill #3

## 2015-09-06 MED FILL — CLOPIDOGREL 75 MG TABLET: 75 | 30 days supply | Qty: 30 | Fill #2

## 2015-09-16 MED FILL — ENALAPRIL MALEATE 2.5 MG TA: 2.5 | 30 days supply | Qty: 30 | Fill #4

## 2015-09-16 MED FILL — ATORVASTATIN 20 MG TABLET: 20 | 90 days supply | Qty: 90 | Fill #2

## 2015-09-16 MED FILL — METOPROLOL SUCC ER 25 MG TA: 25 | 30 days supply | Qty: 30 | Fill #4

## 2015-09-23 MED FILL — METFORMIN HCL ER 750 MG TAB: 750 | 30 days supply | Qty: 60 | Fill #3

## 2015-10-01 ENCOUNTER — Telehealth: Payer: Self-pay | Admitting: Family Medicine

## 2015-10-01 NOTE — Telephone Encounter (Signed)
Called and left message to see if pt would like to rescedule appt

## 2015-10-02 ENCOUNTER — Ambulatory Visit: Payer: BLUE CROSS/BLUE SHIELD | Admitting: Family Medicine

## 2015-10-02 ENCOUNTER — Ambulatory Visit (INDEPENDENT_AMBULATORY_CARE_PROVIDER_SITE_OTHER): Payer: BLUE CROSS/BLUE SHIELD | Admitting: Family Medicine

## 2015-10-02 ENCOUNTER — Encounter: Payer: Self-pay | Admitting: Family Medicine

## 2015-10-02 VITALS — BP 140/70 | HR 80 | Resp 16 | Wt 216.2 lb

## 2015-10-02 DIAGNOSIS — E1159 Type 2 diabetes mellitus with other circulatory complications: Secondary | ICD-10-CM

## 2015-10-02 DIAGNOSIS — Z720 Tobacco use: Secondary | ICD-10-CM | POA: Diagnosis not present

## 2015-10-02 DIAGNOSIS — Z79899 Other long term (current) drug therapy: Secondary | ICD-10-CM | POA: Diagnosis not present

## 2015-10-02 DIAGNOSIS — I10 Essential (primary) hypertension: Secondary | ICD-10-CM

## 2015-10-02 DIAGNOSIS — E1169 Type 2 diabetes mellitus with other specified complication: Secondary | ICD-10-CM

## 2015-10-02 DIAGNOSIS — E118 Type 2 diabetes mellitus with unspecified complications: Secondary | ICD-10-CM | POA: Diagnosis not present

## 2015-10-02 DIAGNOSIS — I2510 Atherosclerotic heart disease of native coronary artery without angina pectoris: Secondary | ICD-10-CM | POA: Diagnosis not present

## 2015-10-02 DIAGNOSIS — Z125 Encounter for screening for malignant neoplasm of prostate: Secondary | ICD-10-CM

## 2015-10-02 DIAGNOSIS — E785 Hyperlipidemia, unspecified: Secondary | ICD-10-CM | POA: Diagnosis not present

## 2015-10-02 DIAGNOSIS — I152 Hypertension secondary to endocrine disorders: Secondary | ICD-10-CM

## 2015-10-02 DIAGNOSIS — E669 Obesity, unspecified: Secondary | ICD-10-CM | POA: Diagnosis not present

## 2015-10-02 LAB — CBC WITH DIFFERENTIAL/PLATELET
Basophils Absolute: 89 {cells}/uL (ref 0–200)
Basophils Relative: 1 %
Eosinophils Absolute: 356 {cells}/uL (ref 15–500)
Eosinophils Relative: 4 %
HCT: 40.9 % (ref 38.5–50.0)
Hemoglobin: 14 g/dL (ref 13.2–17.1)
Lymphocytes Relative: 23 %
Lymphs Abs: 2047 {cells}/uL (ref 850–3900)
MCH: 29.2 pg (ref 27.0–33.0)
MCHC: 34.2 g/dL (ref 32.0–36.0)
MCV: 85.2 fL (ref 80.0–100.0)
MPV: 9.8 fL (ref 7.5–12.5)
Monocytes Absolute: 712 {cells}/uL (ref 200–950)
Monocytes Relative: 8 %
Neutro Abs: 5696 {cells}/uL (ref 1500–7800)
Neutrophils Relative %: 64 %
Platelets: 238 K/uL (ref 140–400)
RBC: 4.8 MIL/uL (ref 4.20–5.80)
RDW: 14.1 % (ref 11.0–15.0)
WBC: 8.9 K/uL (ref 4.0–10.5)

## 2015-10-02 LAB — LIPID PANEL
CHOLESTEROL: 142 mg/dL (ref 125–200)
HDL: 30 mg/dL — ABNORMAL LOW (ref >=40)
LDL Cholesterol: 71 mg/dL (ref <130)
Total CHOL/HDL Ratio: 4.7 Ratio (ref <=5.0)
Triglycerides: 203 mg/dL — ABNORMAL HIGH (ref <150)
VLDL: 41 mg/dL — ABNORMAL HIGH (ref <30)

## 2015-10-02 LAB — COMPREHENSIVE METABOLIC PANEL
ALK PHOS: 107 U/L (ref 40–115)
ALT: 12 U/L (ref 9–46)
AST: 16 U/L (ref 10–35)
Albumin: 3.9 g/dL (ref 3.6–5.1)
BILIRUBIN TOTAL: 0.4 mg/dL (ref 0.2–1.2)
BUN: 12 mg/dL (ref 7–25)
CO2: 25 mmol/L (ref 20–31)
CREATININE: 0.84 mg/dL (ref 0.70–1.25)
Calcium: 8.8 mg/dL (ref 8.6–10.3)
Chloride: 105 mmol/L (ref 98–110)
Glucose, Bld: 148 mg/dL — ABNORMAL HIGH (ref 65–99)
POTASSIUM: 4.5 mmol/L (ref 3.5–5.3)
SODIUM: 138 mmol/L (ref 135–146)
TOTAL PROTEIN: 6.3 g/dL (ref 6.1–8.1)

## 2015-10-02 LAB — POCT GLYCOSYLATED HEMOGLOBIN (HGB A1C): Hemoglobin A1C: 7.5

## 2015-10-02 NOTE — Progress Notes (Signed)
Subjective:    Patient ID: Jerome Rodriguez, male    DOB: 22-Dec-1955, 60 y.o.   MRN: [id number]  Jerome Rodriguez is a 60 y.o. male who presents for follow-up of Type 2 diabetes mellitus.  Home blood sugar records: Usually he checks this once or twice per week. They're in the low 100s before meals and less than 135 after meals. Current symptoms/problems include none and have been stable. Daily foot checks:yes   Any foot concerns: None Exercise: Walking and bicycle daily. He is also noted that he is been able to take his belt in by 4 notches. Diet:Low carb  Last eye exam 2 years ago. His had no difficulty with chest pain, shortness of breath. He continues on atorvastatin and has had no aches and pains. Also his blood pressure medications were reviewed. He does need a refill on his metformin. He continues to smoke. The following portions of the patient's history were reviewed and updated as appropriate: allergies, current medications, past medical history, past social history and problem list.  ROS as in subjective above.     Objective:    Physical Exam Alert and in no distress otherwise not examined.  Blood pressure 140/70, pulse 80, resp. rate 16, weight 216 lb 3.2 oz (98.1 kg).  Lab Review Diabetic Labs Latest Ref Rng & Units 05/29/2015 01/30/2015 07/30/2014 03/26/2014 11/20/2013  HbA1c - 7.9 7.9 7.6 7.6 7.6  Microalbumin mg/L - - - <5.0 -  Micro/Creat Ratio - - - - <5.0 -  Chol 125 - 200 mg/dL - - 154 - -  HDL >=40 mg/dL - - 36(L) - -  Calc LDL <130 mg/dL - - 89 - -  Triglycerides <150 mg/dL - - 143 - -  Creatinine 0.70 - 1.25 mg/dL - - 0.97 - -   BP/Weight 10/02/2015 05/29/2015 02/21/2015 01/30/2015 [id number]  Systolic BP XX123456 96 123456 123XX123 90  Diastolic BP 70 58 68 76 50  Wt. (Lbs) 216.2 219 220.6 222 223  BMI 30.15 30.56 30.95 31.15 31.29   Foot/eye exam completion dates 07/30/2014 02/23/2012  Eye Exam - no diabetic rentinopathy  Foot Form Completion Done -  Hemoglobin A1c  is 7.5 foot exam recorded and is normal except for dry skin  Jerome Rodriguez  reports that he has been smoking Cigars.  He does not have any smokeless tobacco history on file. He reports that he does not drink alcohol or use drugs.     Assessment & Plan:    Type 2 diabetes mellitus with complication, without long-term current use of insulin (HCC) - Plan: HgB A1c, CBC with Differential/Platelet, Comprehensive metabolic panel, Lipid panel, CANCELED: POCT UA - Microalbumin  Hyperlipidemia associated with type 2 diabetes mellitus ([place]) - Plan: Lipid panel  Obesity (BMI 30-39.9) - Plan: CBC with Differential/Platelet, Comprehensive metabolic panel, Lipid panel  Tobacco abuse  Hypertension associated with diabetes ([place]) - Plan: CBC with Differential/Platelet, Comprehensive metabolic panel  ASHD (arteriosclerotic heart disease) - Plan: CBC with Differential/Platelet, Comprehensive metabolic panel, Lipid panel  Encounter for long-term (current) use of medications  Screening for prostate cancer - Plan: PSA   1. Rx changes: none 2. Education: Reviewed 'ABCs' of diabetes management (respective goals in parentheses):  A1C (<7), blood pressure (<130/80), and cholesterol (LDL <100). 3. Compliance at present is estimated to be good. Efforts to improve compliance (if necessary) will be directed at No change. 4. Follow up: 4 months He will scheduled to see Dr. Bing Plume who has taken care of his eyes in  the past. Shot offered but not interested. Overall he seems to be doing well. Recommended he use lotions on his feet to help with the dryness. Encouraged him to continue to check his blood sugars. Presently he is having no difficulty with cardiac type symptoms.

## 2015-10-03 LAB — PSA: PSA: 0.4 ng/mL (ref <=4.0)

## 2015-10-07 MED FILL — CLOPIDOGREL 75 MG TABLET: 75 | 30 days supply | Qty: 30 | Fill #3

## 2015-10-14 MED FILL — ENALAPRIL MALEATE 2.5 MG TA: 2.5 | 90 days supply | Qty: 90 | Fill #5

## 2015-10-14 MED FILL — METOPROLOL SUCC ER 25 MG TA: 25 | 90 days supply | Qty: 90 | Fill #5

## 2015-10-22 ENCOUNTER — Other Ambulatory Visit: Payer: Self-pay | Admitting: Family Medicine

## 2015-10-22 MED FILL — METFORMIN HCL ER 750 MG TAB: 750 | 30 days supply | Qty: 60 | Fill #0

## 2015-11-05 MED FILL — CLOPIDOGREL 75 MG TABLET: 75 | 90 days supply | Qty: 90 | Fill #4

## 2015-11-20 MED FILL — METFORMIN HCL ER 750 MG TAB: 750 | 30 days supply | Qty: 60 | Fill #1

## 2015-11-26 ENCOUNTER — Other Ambulatory Visit: Payer: Self-pay

## 2015-11-26 ENCOUNTER — Telehealth: Payer: Self-pay | Admitting: Family Medicine

## 2015-11-26 MED ORDER — GLUCOSE BLOOD VI STRP: 1.0000 | ORAL_STRIP | Freq: Two times a day (BID) | 11 refills | Status: DC

## 2015-11-26 NOTE — Telephone Encounter (Signed)
I have refiiled test strips

## 2015-11-26 NOTE — Telephone Encounter (Signed)
Pt called and stated he is completely out of test strips. Pt states his machine is [employer]. Please send in refills to [facility]. Pt can be reached at [phone]

## 2015-12-16 MED FILL — METFORMIN HCL ER 750 MG TAB: 750 | 30 days supply | Qty: 60 | Fill #2

## 2015-12-16 MED FILL — ATORVASTATIN 20 MG TABLET: 20 | 90 days supply | Qty: 90 | Fill #3

## 2016-01-10 MED FILL — METOPROLOL SUCC ER 25 MG TA: 25 | 60 days supply | Qty: 60 | Fill #6

## 2016-01-10 MED FILL — ENALAPRIL MALEATE 2.5 MG TA: 2.5 | 60 days supply | Qty: 60 | Fill #6

## 2016-01-15 ENCOUNTER — Telehealth: Payer: Self-pay

## 2016-01-15 NOTE — Telephone Encounter (Signed)
He continues up to 8 Lomotil pills per day

## 2016-01-15 NOTE — Telephone Encounter (Signed)
Pt questions if you can call him in something for diarrhea. He is not sure what has caused it.he states he will be heading to [facility] in a little bit. Advised pt need for OV. He would like a callback when something is called in. Declined OV.  [phone].

## 2016-01-21 ENCOUNTER — Other Ambulatory Visit: Payer: Self-pay | Admitting: Family Medicine

## 2016-01-21 MED FILL — METFORMIN HCL ER 750 MG TAB: 750 | 90 days supply | Qty: 180 | Fill #0

## 2016-02-04 ENCOUNTER — Encounter: Payer: Self-pay | Admitting: Family Medicine

## 2016-02-04 ENCOUNTER — Ambulatory Visit (INDEPENDENT_AMBULATORY_CARE_PROVIDER_SITE_OTHER): Payer: BLUE CROSS/BLUE SHIELD | Admitting: Family Medicine

## 2016-02-04 VITALS — BP 114/56 | HR 70 | Wt 222.2 lb

## 2016-02-04 DIAGNOSIS — E785 Hyperlipidemia, unspecified: Secondary | ICD-10-CM

## 2016-02-04 DIAGNOSIS — E1169 Type 2 diabetes mellitus with other specified complication: Secondary | ICD-10-CM

## 2016-02-04 DIAGNOSIS — E118 Type 2 diabetes mellitus with unspecified complications: Secondary | ICD-10-CM

## 2016-02-04 DIAGNOSIS — E669 Obesity, unspecified: Secondary | ICD-10-CM

## 2016-02-04 DIAGNOSIS — E1159 Type 2 diabetes mellitus with other circulatory complications: Secondary | ICD-10-CM

## 2016-02-04 DIAGNOSIS — I2510 Atherosclerotic heart disease of native coronary artery without angina pectoris: Secondary | ICD-10-CM

## 2016-02-04 DIAGNOSIS — Z720 Tobacco use: Secondary | ICD-10-CM | POA: Diagnosis not present

## 2016-02-04 DIAGNOSIS — Z79899 Other long term (current) drug therapy: Secondary | ICD-10-CM

## 2016-02-04 DIAGNOSIS — I10 Essential (primary) hypertension: Secondary | ICD-10-CM

## 2016-02-04 LAB — POCT GLYCOSYLATED HEMOGLOBIN (HGB A1C): Hemoglobin A1C: 7.9

## 2016-02-04 MED FILL — CLOPIDOGREL 75 MG TABLET: 75 | 30 days supply | Qty: 30 | Fill #5

## 2016-02-04 NOTE — Progress Notes (Signed)
  Subjective:    Patient ID: Jerome Rodriguez, male    DOB: 12/01/1955, 61 y.o.   MRN: [id number]  Jerome Rodriguez is a 61 y.o. male who presents for follow-up of Type 2 diabetes mellitus.  Patient is checking home blood sugars.   Home blood sugar records: 123 to 200 How often is blood sugars being checked: BID Current symptoms/problems none Daily foot checks: yes   Any foot concerns: none Last eye exam: 04/15/12.He plans to set up an appointment to see Dr. Bing Plume Exercise: no as much right now  He has had no difficulty with chest pain, shortness of breath but is scheduled to see his cardiologist. Continues on atorvastatin Plavix as well as aspirin. He also taking enalapril. Continues on metoprolol and metformin. Has not used nitroglycerin recently. He continues to smoke occasional cigar. The following portions of the patient's history were reviewed and updated as appropriate: allergies, current medications, past medical history, past social history and problem list.  ROS as in subjective above.     Objective:    Physical Exam Alert and in no distress otherwise not examined.   Lab Review Diabetic Labs Latest Ref Rng & Units 10/02/2015 05/29/2015 01/30/2015 07/30/2014 03/26/2014  HbA1c - 7.5 7.9 7.9 7.6 7.6  Microalbumin mg/L - - - - <5.0  Micro/Creat Ratio - - - - - <5.0  Chol 125 - 200 mg/dL 142 - - 154 -  HDL >=40 mg/dL 30(L) - - 36(L) -  Calc LDL <130 mg/dL 71 - - 89 -  Triglycerides <150 mg/dL 203(H) - - 143 -  Creatinine 0.70 - 1.25 mg/dL 0.84 - - 0.97 -   BP/Weight 10/02/2015 05/29/2015 02/21/2015 01/30/2015 [id number]  Systolic BP XX123456 96 123456 123XX123 90  Diastolic BP 70 58 68 76 50  Wt. (Lbs) 216.2 219 220.6 222 223  BMI 30.15 30.56 30.95 31.15 31.29   Foot/eye exam completion dates 10/02/2015 07/30/2014  Eye Exam - -  Foot Form Completion Done Done  A1c is 7.9  Jerome Rodriguez  reports that he has been smoking Cigars.  He does not have any smokeless tobacco history on file. He  reports that he does not drink alcohol or use drugs.     Assessment & Plan:    Type 2 diabetes mellitus with complication, without long-term current use of insulin (HCC)  Hyperlipidemia associated with type 2 diabetes mellitus ([place])  Obesity (BMI 30-39.9)  Tobacco abuse  Hypertension associated with diabetes ([place])  ASHD (arteriosclerotic heart disease)  Encounter for long-term (current) use of medications   1. Rx changes: none 2. Education: Reviewed 'ABCs' of diabetes management (respective goals in parentheses):  A1C (<7), blood pressure (<130/80), and cholesterol (LDL <100). 3. Compliance at present is estimated to be fair. Efforts to improve compliance (if necessary) will be directed at increased exercise. He recognizes that he has not done good job of taking care of himself in regard to exercise and does plan to get back into a regular regimen. Discussed the need for him to get his blood sugars down. If his A1c goes above 8, explained that I would need to be more aggressive with his medications. 4. Follow up: 4 months

## 2016-02-05 ENCOUNTER — Ambulatory Visit: Payer: BLUE CROSS/BLUE SHIELD | Admitting: Family Medicine

## 2016-03-06 ENCOUNTER — Telehealth: Payer: Self-pay | Admitting: Cardiovascular Disease

## 2016-03-06 ENCOUNTER — Other Ambulatory Visit: Payer: Self-pay | Admitting: *Deleted

## 2016-03-06 DIAGNOSIS — E1159 Type 2 diabetes mellitus with other circulatory complications: Secondary | ICD-10-CM

## 2016-03-06 DIAGNOSIS — E785 Hyperlipidemia, unspecified: Secondary | ICD-10-CM

## 2016-03-06 DIAGNOSIS — E1169 Type 2 diabetes mellitus with other specified complication: Secondary | ICD-10-CM

## 2016-03-06 DIAGNOSIS — I2510 Atherosclerotic heart disease of native coronary artery without angina pectoris: Secondary | ICD-10-CM

## 2016-03-06 DIAGNOSIS — I10 Essential (primary) hypertension: Principal | ICD-10-CM

## 2016-03-06 MED ORDER — ENALAPRIL MALEATE 2.5 MG PO TABS: 2.5000 mg | ORAL_TABLET | Freq: Every day | ORAL | 0 refills | Status: DC

## 2016-03-06 MED ORDER — CLOPIDOGREL BISULFATE 75 MG PO TABS: 75.0000 mg | ORAL_TABLET | Freq: Every day | ORAL | 0 refills | Status: DC

## 2016-03-06 MED ORDER — ATORVASTATIN CALCIUM 20 MG PO TABS: 20.0000 mg | ORAL_TABLET | Freq: Every day | ORAL | 0 refills | Status: DC

## 2016-03-06 MED ORDER — METOPROLOL SUCCINATE ER 25 MG PO TB24: 25.0000 mg | ORAL_TABLET | Freq: Every day | ORAL | 0 refills | Status: DC

## 2016-03-06 MED FILL — ENALAPRIL MALEATE 2.5 MG TA: 2.5 | 90 days supply | Qty: 90 | Fill #0

## 2016-03-06 MED FILL — ATORVASTATIN 20 MG TABLET: 20 | 90 days supply | Qty: 90 | Fill #0

## 2016-03-06 MED FILL — CLOPIDOGREL 75 MG TABLET: 75 | 90 days supply | Qty: 90 | Fill #0

## 2016-03-06 MED FILL — METOPROLOL SUCC ER 25 MG TA: 25 | 90 days supply | Qty: 90 | Fill #0

## 2016-03-06 NOTE — Telephone Encounter (Signed)
New message       *STAT* If patient is at the pharmacy, call can be transferred to refill team.   1. Which medications need to be refilled? (please list name of each medication and dose if known) generic plavix, atorvastatin, metoprolol, enalpril 2. Which pharmacy/location (including street and city if local pharmacy) is medication to be sent to? [facility] on [address] 3. Do they need a 30 day or 90 day supply? [place]

## 2016-04-20 ENCOUNTER — Telehealth: Payer: Self-pay | Admitting: Family Medicine

## 2016-04-20 ENCOUNTER — Other Ambulatory Visit: Payer: Self-pay

## 2016-04-20 MED ORDER — METFORMIN HCL ER 750 MG PO TB24: 750.0000 mg | ORAL_TABLET | Freq: Two times a day (BID) | ORAL | 0 refills | Status: DC

## 2016-04-20 MED FILL — METFORMIN HCL ER 750 MG TAB: 750 | 90 days supply | Qty: 180 | Fill #0

## 2016-04-20 NOTE — Telephone Encounter (Signed)
Pt called requesting a refill on his metformin  pt would like it sent to the [place], [place] - [address] [facility].

## 2016-04-20 NOTE — Telephone Encounter (Signed)
Sent in metformin 

## 2016-04-22 NOTE — Progress Notes (Signed)
Cardiology Office Note Date:  04/23/2016   ID:  [name], DOB 1955-03-11, MRN [id number]  PCP:  Jerome Haste, MD  Cardiologist:  Sherren Mocha, MD    Chief Complaint  Patient presents with  . Coronary Artery Disease    follow-up     History of Present Illness: Jerome Rodriguez is a 61 y.o. male who presents for  followup evaluation. The patient has been followed for coronary artery disease. He initially underwent stenting of the LAD in 2004. He presented in 2014 with symptoms of unstable angina and was found to have 99% stenosis within one of his LAD stents. He was treated with PCI using a single drug-eluting stent. He's been maintained on ASA and plavix.  He has Type II DM and is followed closely by Dr Jerome Rodriguez - most recent note is reviewed today.  He is here alone today. Today, he denies symptoms of palpitations, chest pain, shortness of breath, orthopnea, PND, lower extremity edema, dizziness, or syncope. He's working at a [employer] and doing some walking. Has been trying to lose some weight. Inconsistent with diet. Most days he is walking 30 minutes.  Past Medical History:  Diagnosis Date  . Abnormal chest CT    a. 07/2012: scattered bilat noncalcified pulm nodules ranging in size from a few mm to a max of 87m in LLL - rec f/u in 3-6 mos.  .Marland Rodriguez (coronary artery disease)    a. NSTEMI 10/04 => LHC: mLAD 99%=> Taxus DES to mLAD and Taxus DES to dLAD;  b. ETT-Myoview 11/2011: normal, EF 73%, no ischemia; c. 07/2012 Cath/PCI: LM <10, LAD 90 ISRp/99 ISRd (3.0x38 Promus DES), LCX 21m30-40d, RCA 50-60p, 30d, EF 55-65%.  . Diabetes mellitus   . Dyslipidemia   . ED (erectile dysfunction)   . HTN (hypertension)   . Obesity   . Smoker    CIGARS    Past Surgical History:  Procedure Laterality Date  . APPENDECTOMY    . arthroscopic knee surgery    . CORONARY STENT PLACEMENT    . LEFT HEART CATHETERIZATION WITH CORONARY ANGIOGRAM N/A 07/11/2012   Procedure: LEFT HEART CATHETERIZATION WITH CORONARY ANGIOGRAM;  Surgeon: Jerome M JoMartiniqueMD;  [facility];  Service: Cardiovascular;  Laterality: N/A;  . right shoulder surgery      Current Outpatient Prescriptions  Medication Sig Dispense Refill  . aspirin 81 MG EC tablet Take 1 tablet (81 mg total) by mouth daily.    . Marland Kitchentorvastatin (LIPITOR) 20 MG tablet Take 1 tablet (20 mg total) by mouth daily. 90 tablet 0  . Blood Glucose Monitoring Suppl (BLOOD GLUCOSE METER) kit Pt wants a Freestyle meter, lancets and test strips. 1 each 0  . clopidogrel (PLAVIX) 75 MG tablet Take 1 tablet (75 mg total) by mouth daily. 90 tablet 0  . enalapril (VASOTEC) 2.5 MG tablet Take 1 tablet (2.5 mg total) by mouth daily. 90 tablet 0  . glucose blood (TRUETEST TEST) test strip 1 each by Other route 2 (two) times daily. Use as instructed (This is for Ture test DX: E11.9) 100 each 11  . Lancets Misc. MISC 1 each by Does not apply route 2 (two) times daily. (This is for ture test DX: E11.9) 100 each 11  . loratadine (CLARITIN) 10 MG tablet Take 10 mg by mouth daily.    . metFORMIN (GLUCOPHAGE-XR) 750 MG 24 hr tablet Take 1 tablet (750 mg total) by mouth 2 (two) times daily. 180 tablet  0  . metoprolol succinate (TOPROL-XL) 25 MG 24 hr tablet Take 1 tablet (25 mg total) by mouth daily. 90 tablet 0  . Multiple Vitamin (MULTIVITAMIN) tablet Take 1 tablet by mouth daily.      . nitroGLYCERIN (NITROSTAT) 0.4 MG SL tablet Place 1 tablet (0.4 mg total) under the tongue every 5 (five) minutes x 3 doses as needed for chest pain. (Patient not taking: Reported on 05/29/2015) 25 tablet 3   No current facility-administered medications for this visit.     Allergies:   Effient [prasugrel]; Lactose intolerance (gi); and Saxagliptin-metformin er   Social History:  The patient  reports that he has been smoking Cigars.  He has never used smokeless tobacco. He reports that he does not drink alcohol or use drugs.   Family  History:  The patient's  family history includes Diabetic kidney disease in his father and mother; Heart attack in his father and mother; Heart failure in his father.    ROS:  Please see the history of present illness.   All other systems are reviewed and negative.    PHYSICAL EXAM: VS:  BP (!) 112/56   Pulse 76   Ht '5\' 11"'$  (1.803 m)   Wt 213 lb (96.6 kg)   BMI 29.71 kg/m  , BMI Body mass index is 29.71 kg/m. GEN: Well nourished, well developed, in no acute distress  HEENT: normal  Neck: no JVD, no masses. No carotid bruits Cardiac: RRR without murmur or gallop                Respiratory:  clear to auscultation bilaterally, normal work of breathing GI: soft, nontender, nondistended, + BS MS: no deformity or atrophy  Ext: no pretibial edema, pedal pulses 2+= bilaterally Skin: warm and dry, no rash Neuro:  Strength and sensation are intact Psych: euthymic mood, full affect  EKG:  EKG is ordered today. The ekg ordered today shows NSR 76 bpm, within normal limits  Recent Labs: 10/02/2015: ALT 12; BUN 12; Creat 0.84; Hemoglobin 14.0; Platelets 238; Potassium 4.5; Sodium 138   Lipid Panel     Component Value Date/Time   CHOL 142 10/02/2015 0913   TRIG 203 (H) 10/02/2015 0913   HDL 30 (L) 10/02/2015 0913   CHOLHDL 4.7 10/02/2015 0913   VLDL 41 (H) 10/02/2015 0913   LDLCALC 71 10/02/2015 0913      Wt Readings from Last 3 Encounters:  04/23/16 213 lb (96.6 kg)  02/04/16 222 lb 3.2 oz (100.8 kg)  10/02/15 216 lb 3.2 oz (98.1 kg)     Cardiac Studies Reviewed: HgB A1C 7.9  ASSESSMENT AND PLAN: 1.  CAD, native vessel, without angina: maintained on long term DAPT. No bleeding problems. On a good risk reduction program.  2. Hyperlipidemia: LDL 71. Elevated trig likely related to DM. Discussed diet/lifestyle at length.  3. Hypertension: BP well controlled. On an ACE in setting diabetes and CAD.  4. Type II DM: followed by Dr Jerome Rodriguez. Most recent labs and office notes  reviewed. Counseling regarding diet and exercise done. A1C 7.9.  Current medicines are reviewed with the patient today.  The patient does not have concerns regarding medicines.  Labs/ tests ordered today include:   Orders Placed This Encounter  Procedures  . EKG 12-Lead    Disposition:   FU one year  Signed, Sherren Mocha, MD  04/23/2016 10:27 AM    [facility] [place], [place], [place]  [address] Phone: [phone];  Fax: [phone]  

## 2016-04-23 ENCOUNTER — Encounter: Payer: Self-pay | Admitting: Cardiovascular Disease

## 2016-04-23 ENCOUNTER — Ambulatory Visit (INDEPENDENT_AMBULATORY_CARE_PROVIDER_SITE_OTHER): Payer: BLUE CROSS/BLUE SHIELD | Admitting: Cardiovascular Disease

## 2016-04-23 VITALS — BP 112/56 | HR 76 | Ht 71.0 in | Wt 213.0 lb

## 2016-04-23 DIAGNOSIS — E785 Hyperlipidemia, unspecified: Secondary | ICD-10-CM

## 2016-04-23 DIAGNOSIS — I2510 Atherosclerotic heart disease of native coronary artery without angina pectoris: Secondary | ICD-10-CM | POA: Diagnosis not present

## 2016-04-23 DIAGNOSIS — E1169 Type 2 diabetes mellitus with other specified complication: Secondary | ICD-10-CM

## 2016-04-23 NOTE — Patient Instructions (Signed)

## 2016-06-04 ENCOUNTER — Ambulatory Visit: Payer: BLUE CROSS/BLUE SHIELD | Admitting: Family Medicine

## 2016-06-04 ENCOUNTER — Other Ambulatory Visit: Payer: Self-pay | Admitting: Cardiovascular Disease

## 2016-06-04 ENCOUNTER — Telehealth: Payer: Self-pay | Admitting: Cardiovascular Disease

## 2016-06-04 DIAGNOSIS — E785 Hyperlipidemia, unspecified: Principal | ICD-10-CM

## 2016-06-04 DIAGNOSIS — E1159 Type 2 diabetes mellitus with other circulatory complications: Secondary | ICD-10-CM

## 2016-06-04 DIAGNOSIS — E1169 Type 2 diabetes mellitus with other specified complication: Secondary | ICD-10-CM

## 2016-06-04 DIAGNOSIS — I2510 Atherosclerotic heart disease of native coronary artery without angina pectoris: Secondary | ICD-10-CM

## 2016-06-04 DIAGNOSIS — I10 Essential (primary) hypertension: Secondary | ICD-10-CM

## 2016-06-04 MED FILL — CLOPIDOGREL 75 MG TABLET: 75 | 30 days supply | Qty: 30 | Fill #0

## 2016-06-04 NOTE — Telephone Encounter (Signed)
I spoke with the pt and he has already spoken with our refill department. Prescriptions have been sent.

## 2016-06-04 NOTE — Telephone Encounter (Signed)
Patient calling, states that he is contacting you in regards to his clopidogrel medication. Thanks.

## 2016-06-11 MED FILL — METOPROLOL SUCC ER 25 MG TA: 25 | 30 days supply | Qty: 30 | Fill #0

## 2016-06-11 MED FILL — ENALAPRIL MALEATE 2.5 MG TA: 2.5 | 30 days supply | Qty: 30 | Fill #0

## 2016-06-11 MED FILL — ATORVASTATIN 20 MG TABLET: 20 | 30 days supply | Qty: 30 | Fill #0

## 2016-06-30 MED FILL — CLOPIDOGREL 75 MG TABLET: 75 | 30 days supply | Qty: 30 | Fill #1

## 2016-07-13 MED FILL — ENALAPRIL MALEATE 2.5 MG TA: 2.5 | 30 days supply | Qty: 30 | Fill #1

## 2016-07-13 MED FILL — METOPROLOL SUCC ER 25 MG TA: 25 | 30 days supply | Qty: 30 | Fill #1

## 2016-07-13 MED FILL — ATORVASTATIN 20 MG TABLET: 20 | 30 days supply | Qty: 30 | Fill #1

## 2016-07-15 ENCOUNTER — Other Ambulatory Visit: Payer: Self-pay | Admitting: Family Medicine

## 2016-07-16 MED FILL — METFORMIN HCL ER 750 MG TAB: 750 | 90 days supply | Qty: 180 | Fill #0

## 2016-08-03 MED FILL — CLOPIDOGREL 75 MG TABLET: 75 | 30 days supply | Qty: 30 | Fill #2

## 2016-08-11 MED FILL — ENALAPRIL MALEATE 2.5 MG TA: 2.5 | 30 days supply | Qty: 30 | Fill #2

## 2016-08-11 MED FILL — METOPROLOL SUCC ER 25 MG TA: 25 | 30 days supply | Qty: 30 | Fill #2

## 2016-08-11 MED FILL — ATORVASTATIN 20 MG TABLET: 20 | 30 days supply | Qty: 30 | Fill #2

## 2016-09-02 MED FILL — CLOPIDOGREL 75 MG TABLET: 75 | 30 days supply | Qty: 30 | Fill #3

## 2016-09-10 MED FILL — ENALAPRIL MALEATE 2.5 MG TA: 2.5 | 30 days supply | Qty: 30 | Fill #3

## 2016-09-10 MED FILL — ATORVASTATIN 20 MG TABLET: 20 | 30 days supply | Qty: 30 | Fill #3

## 2016-09-10 MED FILL — METOPROLOL SUCC ER 25 MG TA: 25 | 30 days supply | Qty: 30 | Fill #3

## 2016-10-02 MED FILL — CLOPIDOGREL 75 MG TABLET: 75 | 30 days supply | Qty: 30 | Fill #4

## 2016-10-09 MED FILL — ENALAPRIL MALEATE 2.5 MG TA: 2.5 | 30 days supply | Qty: 30 | Fill #4

## 2016-10-09 MED FILL — METOPROLOL SUCC ER 25 MG TA: 25 | 30 days supply | Qty: 30 | Fill #4

## 2016-10-14 MED FILL — ATORVASTATIN 20 MG TABLET: 20 | 30 days supply | Qty: 30 | Fill #4

## 2016-10-26 ENCOUNTER — Other Ambulatory Visit: Payer: Self-pay | Admitting: Family Medicine

## 2016-10-26 MED ORDER — METFORMIN HCL ER 750 MG PO TB24
750.0000 mg | ORAL_TABLET | Freq: Two times a day (BID) | ORAL | 0 refills | Status: DC
Start: 2016-10-26 — End: 2017-02-01

## 2016-10-26 MED FILL — METFORMIN HCL ER 750 MG TAB: 750 | 30 days supply | Qty: 60 | Fill #0

## 2016-10-26 NOTE — Telephone Encounter (Signed)
Pt called and made appt for Dec 12th.  He is out of insurance and has 1 pill left.  Refilled Metformin to cover him until his appt.  Meds sent into [facility]

## 2016-10-26 NOTE — Telephone Encounter (Signed)
Spoke with patient and he has not been seen since January. He states that he will be applying for [facility] in Dec and may have it by the end of the year-he cannot afford the OV out of pocket-he wants to know if you will okay one more 90 day of metformin to [place]?

## 2016-11-02 MED FILL — CLOPIDOGREL 75 MG TABLET: 75 | 30 days supply | Qty: 30 | Fill #5

## 2016-11-09 MED FILL — METOPROLOL SUCC ER 25 MG TA: 25 | 30 days supply | Qty: 30 | Fill #5

## 2016-11-09 MED FILL — ENALAPRIL MALEATE 2.5 MG TA: 2.5 | 30 days supply | Qty: 30 | Fill #5

## 2016-11-09 MED FILL — ATORVASTATIN 20 MG TABLET: 20 | 30 days supply | Qty: 30 | Fill #5

## 2016-11-09 MED FILL — CLINDAMYCIN HCL 150 MG CAPS: 150 | 7 days supply | Qty: 21 | Fill #0

## 2016-11-25 MED FILL — METFORMIN HCL ER 750 MG TAB: 750 | 30 days supply | Qty: 60 | Fill #1

## 2016-12-02 MED FILL — CLOPIDOGREL 75 MG TABLET: 75 | 30 days supply | Qty: 30 | Fill #6

## 2016-12-10 MED FILL — METOPROLOL SUCC ER 25 MG TA: 25 | 30 days supply | Qty: 30 | Fill #6

## 2016-12-10 MED FILL — ENALAPRIL MALEATE 2.5 MG TA: 2.5 | 30 days supply | Qty: 30 | Fill #6

## 2016-12-16 ENCOUNTER — Ambulatory Visit: Payer: BLUE CROSS/BLUE SHIELD | Admitting: Family Medicine

## 2016-12-17 MED FILL — ATORVASTATIN 20 MG TABLET: 20 | 30 days supply | Qty: 30 | Fill #6

## 2016-12-28 MED FILL — METFORMIN HCL ER 750 MG TAB: 750 | 30 days supply | Qty: 60 | Fill #2

## 2016-12-30 MED FILL — CLOPIDOGREL 75 MG TABLET: 75 | 30 days supply | Qty: 30 | Fill #7

## 2017-01-07 MED FILL — METOPROLOL SUCC ER 25 MG TA: 25 | 30 days supply | Qty: 30 | Fill #7

## 2017-01-07 MED FILL — ENALAPRIL MALEATE 2.5 MG TA: 2.5 | 30 days supply | Qty: 30 | Fill #7

## 2017-01-13 MED FILL — ATORVASTATIN 20 MG TABLET: 20 | 30 days supply | Qty: 30 | Fill #7

## 2017-01-29 MED FILL — CLOPIDOGREL 75 MG TABLET: 75 | 30 days supply | Qty: 30 | Fill #8

## 2017-02-01 ENCOUNTER — Encounter: Payer: Self-pay | Admitting: Family Medicine

## 2017-02-01 ENCOUNTER — Ambulatory Visit (INDEPENDENT_AMBULATORY_CARE_PROVIDER_SITE_OTHER): Payer: Self-pay | Admitting: Family Medicine

## 2017-02-01 ENCOUNTER — Other Ambulatory Visit: Payer: Self-pay | Admitting: Family Medicine

## 2017-02-01 VITALS — BP 102/62 | HR 77 | Wt 212.8 lb

## 2017-02-01 DIAGNOSIS — E669 Obesity, unspecified: Secondary | ICD-10-CM

## 2017-02-01 DIAGNOSIS — E118 Type 2 diabetes mellitus with unspecified complications: Secondary | ICD-10-CM

## 2017-02-01 DIAGNOSIS — E1159 Type 2 diabetes mellitus with other circulatory complications: Secondary | ICD-10-CM

## 2017-02-01 DIAGNOSIS — I2510 Atherosclerotic heart disease of native coronary artery without angina pectoris: Secondary | ICD-10-CM

## 2017-02-01 DIAGNOSIS — I10 Essential (primary) hypertension: Secondary | ICD-10-CM

## 2017-02-01 DIAGNOSIS — E1121 Type 2 diabetes mellitus with diabetic nephropathy: Secondary | ICD-10-CM

## 2017-02-01 DIAGNOSIS — Z720 Tobacco use: Secondary | ICD-10-CM

## 2017-02-01 DIAGNOSIS — E785 Hyperlipidemia, unspecified: Secondary | ICD-10-CM

## 2017-02-01 DIAGNOSIS — E1169 Type 2 diabetes mellitus with other specified complication: Secondary | ICD-10-CM

## 2017-02-01 LAB — POCT UA - MICROALBUMIN
Albumin/Creatinine Ratio, Urine, POC: 6.5
Creatinine, POC: 78.6 mg/dL
Microalbumin Ur, POC: 5 mg/L

## 2017-02-01 LAB — POCT GLYCOSYLATED HEMOGLOBIN (HGB A1C): HEMOGLOBIN A1C: 8.4

## 2017-02-01 MED ORDER — ATORVASTATIN CALCIUM 20 MG PO TABS: 20.0000 mg | ORAL_TABLET | Freq: Every day | ORAL | 3 refills | Status: DC

## 2017-02-01 MED ORDER — METOPROLOL SUCCINATE ER 25 MG PO TB24: 25.0000 mg | ORAL_TABLET | Freq: Every day | ORAL | 3 refills | Status: DC

## 2017-02-01 MED ORDER — CLOPIDOGREL BISULFATE 75 MG PO TABS: 75.0000 mg | ORAL_TABLET | Freq: Every day | ORAL | 3 refills | Status: DC

## 2017-02-01 MED ORDER — METFORMIN HCL ER 750 MG PO TB24: 750.0000 mg | ORAL_TABLET | Freq: Two times a day (BID) | ORAL | 0 refills | Status: DC

## 2017-02-01 MED ORDER — ENALAPRIL MALEATE 2.5 MG PO TABS: 2.5000 mg | ORAL_TABLET | Freq: Every day | ORAL | 3 refills | Status: DC

## 2017-02-01 MED FILL — METFORMIN HCL ER 750 MG TAB: 750 | 30 days supply | Qty: 60 | Fill #0

## 2017-02-01 NOTE — Progress Notes (Signed)
Subjective:    Patient ID: Jerome Rodriguez, male    DOB: [birth year], 62 y.o.   MRN: [id number]  Jerome Rodriguez is a 62 y.o. male who presents for follow-up of Type 2 diabetes mellitus.  Patient is checking home blood sugars.   Home blood sugar records: 110-280 How often is blood sugars being checked:twice a day Current symptoms/problems include none and have been unchanged. Daily foot checks: yes  Any foot concerns:no Last eye exam: three years Exercise:walking for a hour each day He does complain of difficulty feeling from the left ear as well as a toothache.  Presently he is in between [employer].  His last eye exam was 4 years ago he states that it does plan to get an appointment.  He continues on his Lipitor and is having no difficulty with this.  He is also taking Plavix as well as enalapril.  Continues on metformin without difficulty.  He is also taking metoprolol.  He has had no chest pain, shortness of breath, DOE or PND.  He has not seen his cardiologist in several months. The following portions of the patient's history were reviewed and updated as appropriate: allergies, current medications, past medical history, past social history and problem list.  ROS as in subjective above.     Objective:    Physical Exam Alert and in no distress otherwise not examined.   Lab Review Diabetic Labs Latest Ref Rng & Units 02/04/2016 10/02/2015 05/29/2015 01/30/2015 07/30/2014  HbA1c - 7.9 7.5 7.9 7.9 7.6  Microalbumin mg/L - - - - -  Micro/Creat Ratio - - - - - -  Chol 125 - 200 mg/dL - 142 - - 154  HDL >=40 mg/dL - 30(L) - - 36(L)  Calc LDL <130 mg/dL - 71 - - 89  Triglycerides <150 mg/dL - 203(H) - - 143  Creatinine 0.70 - 1.25 mg/dL - 0.84 - - 0.97   BP/Weight 04/23/2016 02/04/2016 10/02/2015 05/29/2015 0/27/2536  Systolic BP 644 034 742 96 595  Diastolic BP 56 56 70 58 68  Wt. (Lbs) 213 222.2 216.2 219 220.6  BMI 29.71 30.99 30.15 30.56 30.95   Foot/eye exam  completion dates 10/02/2015 07/30/2014  Eye Exam - -  Foot Form Completion Done Done  A1c is 8.4  Jerome Rodriguez  reports that he has been smoking cigars.  he has never used smokeless tobacco. He reports that he does not drink alcohol or use drugs.     Assessment & Plan:    Type 2 diabetes mellitus with complication, without long-term current use of insulin (HCC) - Plan: CBC with Differential/Platelet, Comprehensive metabolic panel, Lipid panel, POCT UA - Microalbumin, POCT glycosylated hemoglobin (Hb A1C)  Hypertension associated with diabetes (HCC) - Plan: enalapril (VASOTEC) 2.5 MG tablet, metoprolol succinate (TOPROL-XL) 25 MG 24 hr tablet  Hyperlipidemia associated with type 2 diabetes mellitus ([place]) - Plan: atorvastatin (LIPITOR) 20 MG tablet  Atherosclerosis of native coronary artery of native heart without angina pectoris - Plan: metoprolol succinate (TOPROL-XL) 25 MG 24 hr tablet  Obesity (BMI 30-39.9)  Tobacco abuse  Diabetic nephropathy with proteinuria ([place]) - Plan: CBC with Differential/Platelet, Comprehensive metabolic panel, Lipid panel, POCT UA - Microalbumin   1. Rx changes: none I discussed possibly changes to his medication regimen however due to lack of insurance he would like to hold off on that. 2. Education: Reviewed 'ABCs' of diabetes management (respective goals in parentheses):  A1C (<7), blood pressure (<130/80), and cholesterol (LDL <100). 3. Compliance at  present is estimated to be poor. Efforts to improve compliance (if necessary) will be directed at increased exercise. 4. Follow up: 4 months I also explained that he now has evidence of renal involvement with his spilling protein in his urine.  Again encouraged him to get his blood sugars under adequate control.

## 2017-02-02 LAB — CBC WITH DIFFERENTIAL/PLATELET
Basophils Absolute: 0.1 10*3/uL (ref 0.0–0.2)
Basos: 1 %
EOS (ABSOLUTE): 0.4 10*3/uL (ref 0.0–0.4)
Eos: 5 %
Hematocrit: 39.1 % (ref 37.5–51.0)
Hemoglobin: 13.4 g/dL (ref 13.0–17.7)
Immature Grans (Abs): 0 10*3/uL (ref 0.0–0.1)
Immature Granulocytes: 0 %
LYMPHS ABS: 2.5 10*3/uL (ref 0.7–3.1)
Lymphs: 34 %
MCH: 29.2 pg (ref 26.6–33.0)
MCHC: 34.3 g/dL (ref 31.5–35.7)
MCV: 85 fL (ref 79–97)
MONOS ABS: 0.6 10*3/uL (ref 0.1–0.9)
Monocytes: 9 %
Neutrophils Absolute: 3.7 10*3/uL (ref 1.4–7.0)
Neutrophils: 51 %
Platelets: 227 10*3/uL (ref 150–379)
RBC: 4.59 x10E6/uL (ref 4.14–5.80)
RDW: 13.7 % (ref 12.3–15.4)
WBC: 7.3 10*3/uL (ref 3.4–10.8)

## 2017-02-02 LAB — COMPREHENSIVE METABOLIC PANEL
A/G RATIO: 2 (ref 1.2–2.2)
ALK PHOS: 120 IU/L — AB (ref 39–117)
ALT: 11 IU/L (ref 0–44)
AST: 11 IU/L (ref 0–40)
Albumin: 4.1 g/dL (ref 3.6–4.8)
BUN/Creatinine Ratio: 12 (ref 10–24)
BUN: 11 mg/dL (ref 8–27)
Bilirubin Total: 0.4 mg/dL (ref 0.0–1.2)
CHLORIDE: 103 mmol/L (ref 96–106)
CO2: 24 mmol/L (ref 20–29)
Calcium: 9.4 mg/dL (ref 8.6–10.2)
Creatinine, Ser: 0.92 mg/dL (ref 0.76–1.27)
GFR calc Af Amer: 103 mL/min/{1.73_m2} (ref >59)
GFR calc non Af Amer: 89 mL/min/{1.73_m2} (ref >59)
Globulin, Total: 2.1 g/dL (ref 1.5–4.5)
Glucose: 152 mg/dL — ABNORMAL HIGH (ref 65–99)
POTASSIUM: 4 mmol/L (ref 3.5–5.2)
Sodium: 142 mmol/L (ref 134–144)
Total Protein: 6.2 g/dL (ref 6.0–8.5)

## 2017-02-02 LAB — LIPID PANEL
CHOLESTEROL TOTAL: 142 mg/dL (ref 100–199)
Chol/HDL Ratio: 3.9 ratio (ref 0.0–5.0)
HDL: 36 mg/dL — ABNORMAL LOW (ref >39)
LDL Calculated: 67 mg/dL (ref 0–99)
TRIGLYCERIDES: 193 mg/dL — AB (ref 0–149)
VLDL Cholesterol Cal: 39 mg/dL (ref 5–40)

## 2017-02-03 ENCOUNTER — Telehealth: Payer: Self-pay | Admitting: Cardiovascular Disease

## 2017-02-03 MED FILL — PENICILLIN VK 500 MG TABLET: 500 | 10 days supply | Qty: 40 | Fill #0

## 2017-02-03 NOTE — Telephone Encounter (Signed)
New message    1. What dental office are you calling from? [facility]  2. What is your office phone and fax number? Phone [phone], fax [phone]  3. What type of procedure is the patient having performed? extraction  4. What date is procedure scheduled or is the patient there now?  TBD  5. What is your question (ex. Antibiotics prior to procedure, holding medication-we need to know how long dentist wants pt to hold med)? What antibiotic can be given and are there meds to hold

## 2017-02-04 ENCOUNTER — Telehealth: Payer: Self-pay | Admitting: Cardiovascular Disease

## 2017-02-04 NOTE — Telephone Encounter (Signed)
Returned pts call and he has been scheduled to see Robbie Lis, PA-C, 02/09/17.

## 2017-02-04 NOTE — Telephone Encounter (Signed)
Pt needs to be seen it has been 10 months since last visit

## 2017-02-04 NOTE — Telephone Encounter (Signed)
F/U phone   "Note    Left detailed message for pt that he would need an appt before he can be cleared for surgery and to call and get that scheduled.     " Patient calling in regards to previous message listed above. Patient is upset and does not understand why he needs appt to be cleared

## 2017-02-04 NOTE — Telephone Encounter (Signed)
Left detailed message for pt that he would need an appt before he can be cleared for surgery and to call and get that scheduled.

## 2017-02-08 NOTE — Progress Notes (Addendum)
Cardiology Office Note    Date:  02/09/2017   ID:  [name], DOB [birth year], MRN [id number]  PCP:  Jerome Lung, MD  Cardiologist: Dr. Burt Knack Chief Complaint: Surgical clearance for multiple (11) dental extraction   History of Present Illness:   Jerome Rodriguez is a 62 y.o. male with hx of CAD, HLD, DM, HTN and tobacco abuse presents for surgical clearance.  Hx of coronary artery disease. He initially underwent stenting of the LAD in 2004. He presented in 2014 with symptoms of unstable angina and was found to have 99% stenosis within one of his LAD stents. He was treated with PCI using a single drug-eluting stent. He's been maintained on ASA and plavix.   He was doing well on cardiac stand point when last seen by Dr. Burt Knack 04/2016.  Here today for clearance. He walks for 1.5 to 2 hours 3-4 times/week and then rides stationary bicycle for 20 minutes with out any limitation. The patient denies nausea, vomiting, fever, chest pain, palpitations, shortness of breath, orthopnea, PND, dizziness, syncope, cough, congestion, abdominal pain, hematochezia, melena, lower extremity edema.    Past Medical History:  Diagnosis Date  . Abnormal chest CT    a. 07/2012: scattered bilat noncalcified pulm nodules ranging in size from a few mm to a max of 66m in LLL - rec f/u in 3-6 mos.  .Marland KitchenCAD (coronary artery disease)    a. NSTEMI 10/04 => LHC: mLAD 99%=> Taxus DES to mLAD and Taxus DES to dLAD;  b. ETT-Myoview 11/2011: normal, EF 73%, no ischemia; c. 07/2012 Cath/PCI: LM <10, LAD 90 ISRp/99 ISRd (3.0x38 Promus DES), LCX 289m30-40d, RCA 50-60p, 30d, EF 55-65%.  . Diabetes mellitus   . Dyslipidemia   . ED (erectile dysfunction)   . HTN (hypertension)   . Obesity   . Smoker    CIGARS    Past Surgical History:  Procedure Laterality Date  . APPENDECTOMY    . arthroscopic knee surgery    . CORONARY STENT PLACEMENT    . LEFT HEART CATHETERIZATION WITH CORONARY ANGIOGRAM N/A  07/11/2012   Procedure: LEFT HEART CATHETERIZATION WITH CORONARY ANGIOGRAM;  Surgeon: Jerome Rodriguez;  [facility];  Service: Cardiovascular;  Laterality: N/A;  . right shoulder surgery      Current Medications: Prior to Admission medications   Medication Sig Start Date End Date Taking? Authorizing Provider  aspirin 81 MG EC tablet Take 1 tablet (81 mg total) by mouth daily. 07/12/12   BeTheora GianottiNP  atorvastatin (LIPITOR) 20 MG tablet Take 1 tablet (20 mg total) by mouth daily. 02/01/17   LaDenita LungMD  Blood Glucose Monitoring Suppl (BLOOD GLUCOSE METER) kit Pt wants a Freestyle meter, lancets and test strips. 03/03/13   LaDenita LungMD  clopidogrel (PLAVIX) 75 MG tablet Take 1 tablet (75 mg total) by mouth daily. 02/01/17   LaDenita LungMD  enalapril (VASOTEC) 2.5 MG tablet Take 1 tablet (2.5 mg total) by mouth daily. 02/01/17   LaDenita LungMD  glucose blood (TRUETEST TEST) test strip 1 each by Other route 2 (two) times daily. Use as instructed (This is for Ture test DX: E11.9) 11/26/15   LaDenita LungMD  Lancets Misc. MISC 1 each by Does not apply route 2 (two) times daily. (This is for ture test DX: E11.9) 07/30/14   LaDenita LungMD  loratadine (CLARITIN) 10 MG tablet Take 10 mg by  mouth daily.    [provider]  metFORMIN (GLUCOPHAGE-XR) 750 MG 24 hr tablet Take 1 tablet (750 mg total) by mouth 2 (two) times daily. 02/01/17   Jerome Lung, MD  metoprolol succinate (TOPROL-XL) 25 MG 24 hr tablet Take 1 tablet (25 mg total) by mouth daily. 02/01/17   Jerome Lung, MD  Multiple Vitamin (MULTIVITAMIN) tablet Take 1 tablet by mouth daily.      [provider]  nitroGLYCERIN (NITROSTAT) 0.4 MG SL tablet Place 1 tablet (0.4 mg total) under the tongue every 5 (five) minutes x 3 doses as needed for chest pain. 02/21/15   Sherren Mocha, MD    Allergies:   Effient [prasugrel]; Lactose intolerance (gi); and Saxagliptin-metformin  er   Social History   Socioeconomic History  . Marital status: Married    Spouse name: None  . Number of children: None  . Years of education: None  . Highest education level: None  Social Needs  . Financial resource strain: None  . Food insecurity - worry: None  . Food insecurity - inability: None  . Transportation needs - medical: None  . Transportation needs - non-medical: None  Occupational History  . Occupation: taxi Geophysicist/field seismologist  Tobacco Use  . Smoking status: Light Tobacco Smoker    Types: Cigars  . Smokeless tobacco: Never Used  . Tobacco comment: smoke 1--d for 25 years; quit 6 months ago. is now smoking ciagrs occasionally   Substance and Sexual Activity  . Alcohol use: No    Comment: rare   . Drug use: No  . Sexual activity: None  Other Topics Concern  . None  Social History Narrative   Married; full time.     Family History:  The patient's family history includes Diabetic kidney disease in his father and mother; Heart attack in his father and mother; Heart failure in his father.   ROS:   Please see the history of present illness.    ROS All other systems reviewed and are negative.   PHYSICAL EXAM:   VS:  BP 112/64   Pulse 89   Ht 5' 11"  (1.803 m)   Wt 210 lb (95.3 kg)   SpO2 96%   BMI 29.29 kg/m    GEN: Well nourished, well developed, in no acute distress  HEENT: normal  Neck: no JVD, carotid bruits, or masses Cardiac: RRR; no murmurs, rubs, or gallops,no edema  Respiratory:  clear to auscultation bilaterally, normal work of breathing GI: soft, nontender, nondistended, + BS MS: no deformity or atrophy  Skin: warm and dry, no rash Neuro:  Alert and Oriented x 3, Strength and sensation are intact Psych: euthymic mood, full affect  Wt Readings from Last 3 Encounters:  02/09/17 210 lb (95.3 kg)  02/01/17 212 lb 12.8 oz (96.5 kg)  04/23/16 213 lb (96.6 kg)      Studies/Labs Reviewed:   EKG:  EKG is ordered today.  The ekg ordered today  demonstrates NSR  Recent Labs: 02/01/2017: ALT 11; BUN 11; Creatinine, Ser 0.92; Hemoglobin 13.4; Platelets 227; Potassium 4.0; Sodium 142   Lipid Panel    Component Value Date/Time   CHOL 142 02/01/2017 1609   TRIG 193 (H) 02/01/2017 1609   HDL 36 (L) 02/01/2017 1609   CHOLHDL 3.9 02/01/2017 1609   CHOLHDL 4.7 10/02/2015 0913   VLDL 41 (H) 10/02/2015 0913   LDLCALC 67 02/01/2017 1609    Additional studies/ records that were reviewed today include:   Cardiac Catheterization:  07/2012 Coronary angiography: Coronary dominance: right  Left mainstem: The left main has mild irregularities less than 10%.  Left anterior descending (LAD): The left anterior descending artery is moderately calcified in the proximal vessel. In the mid vessel at the site of a previous stent there is a 90% in-stent restenosis. This lesion is at the takeoff of the second diagonal. Further distally there is a second stent with a 99% stenosis within the stent. The first diagonal is without significant disease. There is a 60-70% stenosis at the takeoff of the second diagonal.  Left circumflex (LCx): The left circumflex is a large vessel which gives rise to 2 small marginal branches and then terminates in a large posterior lateral branch. In the mid vessel there is diffuse disease up to 20%. The distal vessel has 30-40% disease.  Right coronary artery (RCA): The right coronary is a codominant vessel. There is segmental 50-60% plaque in the proximal vessel there is 30% disease at the crux.  Left ventriculography: Left ventricular systolic function is normal, LVEF is estimated at 55-65%, there is no significant mitral regurgitation   PCI Note:  Following the diagnostic procedure, the decision was made to proceed with PCI of the LAD. Effient 60 mg was given orally. Weight-based bivalirudin was given for anticoagulation. Once a therapeutic ACT was achieved, a 6 [place]  XB LAD 3.5 guide catheter was inserted.  A A  pro-water coronary guidewire was used to cross the lesion.  The lesion was predilated with a  2.5 mm balloon.   there was still significant waisting of the balloon in the mid vessel. We predilated with a 3.0 mm noncompliant balloon to make sure that this lesion would yield. The lesion was then stented with a  3.0 x 38 mm Promus stent.  The stent was postdilated with a  3.25 mm noncompliant balloon.  The more proximal margin of the stent prior to the second diagonal was postdilated with a 4.0 mm noncompliant balloon.  Following PCI, there was 0% residual stenosis and TIMI-3 flow.  There was some plaque shift into the ostium of the second diagonal but there was still excellent TIMI-3 flow in this vessel. The patient was pain free. Final angiography confirmed an excellent result. The patient tolerated the procedure well. There were no immediate procedural complications. A TR band was used for radial hemostasis. The patient was transferred to the post catheterization recovery area for further monitoring.  PCI Data: Vessel -  LAD/Segment -  mid to distal Percent Stenosis (pre)  95% TIMI-flow 2 Stent 3.0 x 38 mm Promus Percent Stenosis (post) 0% TIMI-flow (post) 3  Final Conclusions:    1. Single vessel obstructive coronary disease with in-stent restenosis in the mid and distal LAD. 2. Normal LV function. 3. Successful stenting of the tandem lesions in the mid and distal LAD with a long drug-eluting stent.   Recommendations:   Dual antiplatelet therapy for one year. Aggressive risk factor modification.     ASSESSMENT & PLAN:    1. CAD - Continue lifelong DAPT. No angina.   2. HTN - Stable and well control to current medications.   3. HLD - 02/01/2017: Cholesterol, Total 142; HDL 36; LDL Calculated 67; Triglycerides 193  - Continue statin   4. Surgical clearance - No angina. He is schedule to 11 tooth extraction. I will review with Dr. Burt Knack for how many days he needs to hold his DAPT  therapy.   Addendum: per Dr. Burt Knack "Should hold plavix x 5  days then resume after extraction as soon as he can. Should continue ASA without interruption"  I will route this recommendations to requesting provider.     Medication Adjustments/Labs and Tests Ordered: Current medicines are reviewed at length with the patient today.  Concerns regarding medicines are outlined above.  Medication changes, Labs and Tests ordered today are listed in the Patient Instructions below. Patient Instructions  Medication Instructions:  Your physician recommends that you continue on your current medications as directed. Please refer to the Current Medication list given to you today.   Labwork: None ordered  Testing/Procedures: None ordered  Follow-Up: Your physician wants you to follow-up in: [place] DR. Emelda Fear will receive a reminder letter in the mail two months in advance. If you don't receive a letter, please call our office to schedule the follow-up appointment.   Any Other Special Instructions Will Be Listed Below (If Applicable).     If you need a refill on your cardiac medications before your next appointment, please call your pharmacy.      Jarrett Soho, [place]  02/09/2017 12:04 PM    [facility] [address], [place], [place]  [address] Phone: [phone]; Fax: [phone]

## 2017-02-09 ENCOUNTER — Ambulatory Visit (INDEPENDENT_AMBULATORY_CARE_PROVIDER_SITE_OTHER): Payer: Self-pay | Admitting: Physician Assistant

## 2017-02-09 ENCOUNTER — Encounter: Payer: Self-pay | Admitting: Physician Assistant

## 2017-02-09 ENCOUNTER — Encounter (INDEPENDENT_AMBULATORY_CARE_PROVIDER_SITE_OTHER): Payer: Self-pay

## 2017-02-09 VITALS — BP 112/64 | HR 89 | Ht 71.0 in | Wt 210.0 lb

## 2017-02-09 DIAGNOSIS — E785 Hyperlipidemia, unspecified: Secondary | ICD-10-CM

## 2017-02-09 DIAGNOSIS — E1169 Type 2 diabetes mellitus with other specified complication: Secondary | ICD-10-CM

## 2017-02-09 DIAGNOSIS — Z01810 Encounter for preprocedural cardiovascular examination: Secondary | ICD-10-CM

## 2017-02-09 DIAGNOSIS — I10 Essential (primary) hypertension: Secondary | ICD-10-CM

## 2017-02-09 DIAGNOSIS — E1159 Type 2 diabetes mellitus with other circulatory complications: Secondary | ICD-10-CM

## 2017-02-09 DIAGNOSIS — I2510 Atherosclerotic heart disease of native coronary artery without angina pectoris: Secondary | ICD-10-CM

## 2017-02-09 MED ORDER — ATORVASTATIN CALCIUM 20 MG PO TABS: 20.0000 mg | ORAL_TABLET | Freq: Every day | ORAL | 3 refills | Status: DC

## 2017-02-09 MED ORDER — METOPROLOL SUCCINATE ER 25 MG PO TB24: 25.0000 mg | ORAL_TABLET | Freq: Every day | ORAL | 3 refills | Status: DC

## 2017-02-09 MED ORDER — ENALAPRIL MALEATE 2.5 MG PO TABS: 2.5000 mg | ORAL_TABLET | Freq: Every day | ORAL | 3 refills | Status: DC

## 2017-02-09 MED ORDER — CLOPIDOGREL BISULFATE 75 MG PO TABS: 75.0000 mg | ORAL_TABLET | Freq: Every day | ORAL | 3 refills | Status: DC

## 2017-02-09 MED FILL — ENALAPRIL MALEATE 2.5 MG TA: 2.5 | 30 days supply | Qty: 30 | Fill #8

## 2017-02-09 MED FILL — METOPROLOL SUCCINATE ER 25: 25 | 30 days supply | Qty: 30 | Fill #8

## 2017-02-09 NOTE — Progress Notes (Signed)
Should hold plavix x 5 days then resume after extraction as soon as he can. Should continue ASA without interruption.

## 2017-02-09 NOTE — Patient Instructions (Signed)
Medication Instructions:  Your physician recommends that you continue on your current medications as directed. Please refer to the Current Medication list given to you today.   Labwork: None ordered  Testing/Procedures: None ordered  Follow-Up: Your physician wants you to follow-up in: [place] DR. Emelda Fear will receive a reminder letter in the mail two months in advance. If you don't receive a letter, please call our office to schedule the follow-up appointment.   Any Other Special Instructions Will Be Listed Below (If Applicable).     If you need a refill on your cardiac medications before your next appointment, please call your pharmacy.

## 2017-02-11 NOTE — Addendum Note (Signed)
Addended by: Jacinta Shoe on: 02/11/2017 09:29 AM   Modules accepted: Orders

## 2017-02-17 MED FILL — ATORVASTATIN 20 MG TABLET: 20 | 30 days supply | Qty: 30 | Fill #8

## 2017-03-03 MED FILL — CHLORHEXIDINE 0.12% RINSE: 0.12 | 17 days supply | Qty: 473 | Fill #0

## 2017-03-03 MED FILL — ACETAMINOPHEN/COD #3 TABLET: 300-30 | 4 days supply | Qty: 16 | Fill #0

## 2017-03-03 MED FILL — METFORMIN HCL ER 750 MG TAB: 750 | 30 days supply | Qty: 60 | Fill #1

## 2017-03-03 MED FILL — CLOPIDOGREL 75 MG TABLET: 75 | 30 days supply | Qty: 30 | Fill #9

## 2017-03-10 MED FILL — ENALAPRIL MALEATE 2.5 MG TA: 2.5 | 30 days supply | Qty: 30 | Fill #9

## 2017-03-10 MED FILL — METOPROLOL SUCCINATE ER 25: 25 | 30 days supply | Qty: 30 | Fill #9

## 2017-03-18 MED FILL — ATORVASTATIN 20 MG TABLET: 20 | 30 days supply | Qty: 30 | Fill #9

## 2017-03-31 MED FILL — CLOPIDOGREL 75 MG TABLET: 75 | 30 days supply | Qty: 30 | Fill #10

## 2017-04-08 MED FILL — ENALAPRIL MALEATE 2.5 MG TA: 2.5 | 30 days supply | Qty: 30 | Fill #10

## 2017-04-08 MED FILL — METOPROLOL SUCCINATE ER 25: 25 | 30 days supply | Qty: 30 | Fill #10

## 2017-04-08 MED FILL — METFORMIN HCL ER 750 MG TAB: 750 | 30 days supply | Qty: 60 | Fill #2

## 2017-04-19 MED FILL — ATORVASTATIN 20 MG TABLET: 20 | 30 days supply | Qty: 30 | Fill #10

## 2017-05-03 ENCOUNTER — Other Ambulatory Visit: Payer: Self-pay | Admitting: Family Medicine

## 2017-05-03 MED FILL — ENALAPRIL MALEATE 2.5 MG TA: 2.5 | 30 days supply | Qty: 30 | Fill #11

## 2017-05-03 MED FILL — CLOPIDOGREL 75 MG TABLET: 75 | 30 days supply | Qty: 30 | Fill #11

## 2017-05-03 MED FILL — METFORMIN HCL ER 750 MG TAB: 750 | 90 days supply | Qty: 180 | Fill #0

## 2017-05-03 MED FILL — METOPROLOL SUCCINATE ER 25: 25 | 30 days supply | Qty: 30 | Fill #11

## 2017-05-17 MED FILL — ATORVASTATIN 20 MG TABLET: 20 | 30 days supply | Qty: 30 | Fill #11

## 2017-06-01 ENCOUNTER — Telehealth: Payer: Self-pay | Admitting: Cardiovascular Disease

## 2017-06-01 MED FILL — CLOPIDOGREL 75 MG TABLET: 75 | 90 days supply | Qty: 90 | Fill #0

## 2017-06-01 MED FILL — ENALAPRIL MALEATE 2.5 MG TA: 2.5 | 30 days supply | Qty: 30 | Fill #0

## 2017-06-01 MED FILL — METOPROLOL SUCCINATE ER 25: 25 | 30 days supply | Qty: 30 | Fill #0

## 2017-06-01 NOTE — Telephone Encounter (Signed)
New Message    *STAT* If patient is at the pharmacy, call can be transferred to refill team.   1. Which medications need to be refilled? (please list name of each medication and dose if known) clopidogrel (PLAVIX) 75 MG tablet,   metoprolol succinate (TOPROL-XL) 25 MG 24 hr tablet, enalapril (VASOTEC) 2.5 MG tablet, atorvastatin (LIPITOR) 20 MG tablet        2. Which pharmacy/location (including street and city if local pharmacy) is medication to be sent to? [facility]   3. Do they need a 30 day or 90 day supply? [place]

## 2017-06-01 NOTE — Telephone Encounter (Signed)
Called pt to inform him that his medications were sent to his pharmacy in February with a year supply and I also called pt's pharmacy to make sure that they get pt's medications ready for him. Pt and pharmacy verbalized understanding.

## 2017-06-03 ENCOUNTER — Encounter: Payer: Self-pay | Admitting: Family Medicine

## 2017-06-03 ENCOUNTER — Ambulatory Visit (INDEPENDENT_AMBULATORY_CARE_PROVIDER_SITE_OTHER): Payer: Self-pay | Admitting: Family Medicine

## 2017-06-03 VITALS — BP 98/60 | HR 76 | Temp 97.6°F | Ht 67.75 in | Wt 208.0 lb

## 2017-06-03 DIAGNOSIS — I2510 Atherosclerotic heart disease of native coronary artery without angina pectoris: Secondary | ICD-10-CM

## 2017-06-03 DIAGNOSIS — E785 Hyperlipidemia, unspecified: Secondary | ICD-10-CM

## 2017-06-03 DIAGNOSIS — E1169 Type 2 diabetes mellitus with other specified complication: Secondary | ICD-10-CM

## 2017-06-03 DIAGNOSIS — E118 Type 2 diabetes mellitus with unspecified complications: Secondary | ICD-10-CM

## 2017-06-03 DIAGNOSIS — I10 Essential (primary) hypertension: Secondary | ICD-10-CM

## 2017-06-03 DIAGNOSIS — E669 Obesity, unspecified: Secondary | ICD-10-CM

## 2017-06-03 DIAGNOSIS — E1159 Type 2 diabetes mellitus with other circulatory complications: Secondary | ICD-10-CM

## 2017-06-03 LAB — POCT GLYCOSYLATED HEMOGLOBIN (HGB A1C): HEMOGLOBIN A1C: 8.7 % — AB (ref 4.0–5.6)

## 2017-06-03 NOTE — Progress Notes (Signed)
  Subjective:    Patient ID: Jerome Rodriguez, male    DOB: 1955/11/09, 62 y.o.   MRN: [id number]  Jerome Rodriguez is a 62 y.o. male who presents for follow-up of Type 2 diabetes mellitus.  Patient is checking home blood sugars.   Home blood sugar records: yes meter How often is blood sugars being checked: BID 110 to 190 Current symptoms/problems include none and have been unchanged. Daily foot checks: yes   Any foot concerns: no Last eye exam: 4 years ago Exercise:walking daily, and bike He continues on metformin.  He is also taking atorvastatin.  He continues on metoprolol, enalapril.  Continues on Plavix for his underlying cardiac disease.  He has had no chest pain, shortness of breath.  Still does smoke cigars. The following portions of the patient's history were reviewed and updated as appropriate: allergies, current medications, past medical history, past social history and problem list.  ROS as in subjective above.     Objective:    Physical Exam Alert and in no distress otherwise not examined.    Lab Review Diabetic Labs Latest Ref Rng & Units 02/01/2017 02/04/2016 10/02/2015 05/29/2015 01/30/2015  HbA1c - 8.4 7.9 7.5 7.9 7.9  Microalbumin mg/L 5.0 - - - -  Micro/Creat Ratio - 6.5 - - - -  Chol 100 - 199 mg/dL 142 - 142 - -  HDL >39 mg/dL 36(L) - 30(L) - -  Calc LDL 0 - 99 mg/dL 67 - 71 - -  Triglycerides 0 - 149 mg/dL 193(H) - 203(H) - -  Creatinine 0.76 - 1.27 mg/dL 0.92 - 0.84 - -   BP/Weight 02/09/2017 02/01/2017 04/23/2016 02/04/2016 2/42/3536  Systolic BP 144 315 400 867 619  Diastolic BP 64 62 56 56 70  Wt. (Lbs) 210 212.8 213 222.2 216.2  BMI 29.29 29.68 29.71 30.99 30.15   Foot/eye exam completion dates 10/02/2015 07/30/2014  Eye Exam - -  Foot Form Completion Done Done   A1c is 8.7     Assessment & Plan:    Hypertension associated with diabetes ([place])  Hyperlipidemia associated with type 2 diabetes mellitus ([place])  Atherosclerosis of native coronary  artery of native heart without angina pectoris  Obesity (BMI 30-39.9)  Type 2 diabetes mellitus with complication, without long-term current use of insulin ([place]) - Plan: POCT glycosylated hemoglobin (Hb A1C)   1. Rx changes: none I discussed the fact that without insurance, treatment of his diabetes can be very expensive.  We are also constrained because of his underlying heart disease.  Discussed the fact that the cheapest would be insulin.  He is not interested in doing this and plans to make further changes in his diet and exercise. 2. Education: Reviewed 'ABCs' of diabetes management (respective goals in parentheses):  A1C (<7), blood pressure (<130/80), and cholesterol (LDL <100). 3. Compliance at present is estimated to be poor. Efforts to improve compliance (if necessary) will be directed at increased exercise. 4. Follow up: 4 months

## 2017-06-16 MED FILL — ATORVASTATIN 20 MG TABLET: 20 | 90 days supply | Qty: 90 | Fill #0

## 2017-07-02 MED FILL — ENALAPRIL MALEATE 2.5 MG TA: 2.5 | 90 days supply | Qty: 90 | Fill #1

## 2017-07-02 MED FILL — METOPROLOL SUCCINATE ER 25: 25 | 90 days supply | Qty: 90 | Fill #1

## 2017-08-12 ENCOUNTER — Other Ambulatory Visit: Payer: Self-pay | Admitting: Family Medicine

## 2017-08-12 MED FILL — METFORMIN HCL ER 750 MG TAB: 750 | 30 days supply | Qty: 60 | Fill #0

## 2017-08-27 MED FILL — CLOPIDOGREL 75 MG TABLET: 75 | 90 days supply | Qty: 90 | Fill #1

## 2017-09-14 ENCOUNTER — Other Ambulatory Visit: Payer: Self-pay | Admitting: Family Medicine

## 2017-09-14 MED ORDER — METFORMIN HCL ER 750 MG PO TB24: 750.0000 mg | ORAL_TABLET | Freq: Two times a day (BID) | ORAL | 1 refills | Status: DC

## 2017-09-14 MED FILL — ATORVASTATIN CALCIUM 20 MG: 20 | 90 days supply | Qty: 90 | Fill #1

## 2017-09-14 MED FILL — METFORMIN HCL ER 750 MG TAB: 750 | 30 days supply | Qty: 60 | Fill #1

## 2017-10-01 MED FILL — ENALAPRIL MALEATE 2.5 MG TA: 2.5 | 90 days supply | Qty: 90 | Fill #2

## 2017-10-01 MED FILL — METOPROLOL SUCCINATE ER 25: 25 | 90 days supply | Qty: 90 | Fill #2

## 2017-10-13 MED FILL — METFORMIN HCL ER 750 MG TAB: 750 | 90 days supply | Qty: 180 | Fill #0

## 2017-10-22 ENCOUNTER — Ambulatory Visit (INDEPENDENT_AMBULATORY_CARE_PROVIDER_SITE_OTHER): Payer: Self-pay | Admitting: Family Medicine

## 2017-10-22 ENCOUNTER — Encounter: Payer: Self-pay | Admitting: Family Medicine

## 2017-10-22 VITALS — BP 100/62 | HR 74 | Temp 97.6°F | Wt 206.0 lb

## 2017-10-22 DIAGNOSIS — E785 Hyperlipidemia, unspecified: Secondary | ICD-10-CM

## 2017-10-22 DIAGNOSIS — E669 Obesity, unspecified: Secondary | ICD-10-CM

## 2017-10-22 DIAGNOSIS — E118 Type 2 diabetes mellitus with unspecified complications: Secondary | ICD-10-CM

## 2017-10-22 DIAGNOSIS — I152 Hypertension secondary to endocrine disorders: Secondary | ICD-10-CM

## 2017-10-22 DIAGNOSIS — Z720 Tobacco use: Secondary | ICD-10-CM

## 2017-10-22 DIAGNOSIS — E1169 Type 2 diabetes mellitus with other specified complication: Secondary | ICD-10-CM

## 2017-10-22 DIAGNOSIS — I2510 Atherosclerotic heart disease of native coronary artery without angina pectoris: Secondary | ICD-10-CM

## 2017-10-22 DIAGNOSIS — E1159 Type 2 diabetes mellitus with other circulatory complications: Secondary | ICD-10-CM

## 2017-10-22 DIAGNOSIS — I10 Essential (primary) hypertension: Secondary | ICD-10-CM

## 2017-10-22 LAB — POCT GLYCOSYLATED HEMOGLOBIN (HGB A1C): HEMOGLOBIN A1C: 7.5 % — AB (ref 4.0–5.6)

## 2017-10-22 NOTE — Addendum Note (Signed)
Addended by: Elyse Jarvis on: 10/22/2017 09:34 AM   Modules accepted: Orders

## 2017-10-22 NOTE — Progress Notes (Signed)
  Subjective:    Patient ID: Jerome Rodriguez, male    DOB: [birth year], 62 y.o.   MRN: [id number]  Jerome Rodriguez is a 62 y.o. male who presents for follow-up of Type 2 diabetes mellitus.  He continues on metformin daily as well as Lipitor, Plavix, enalapril and metoprolol.  He has had no chest pain, shortness of breath PND.  He is moving back in with his ex-wife in the near future.  He plans to try and get a new insurance plan through [employer]  Patient is checking home blood sugars.   Home blood sugar records: meter record  How often is blood sugars being checked: QD 130-249 Current symptoms/problems include none and have been unchanged. Daily foot checks: yes   Any foot concerns: no Last eye exam: over year Exercise: walk one hour a day and ride bike half hour .  He states however that he has been less active than normal.  The following portions of the patient's history were reviewed and updated as appropriate: allergies, current medications, past medical history, past social history and problem list.  ROS as in subjective above.     Objective:    Physical Exam Alert and in no distress otherwise not examined.   Lab Review Diabetic Labs Latest Ref Rng & Units 06/03/2017 02/01/2017 02/04/2016 10/02/2015 05/29/2015  HbA1c 4.0 - 5.6 % 8.7(A) 8.4 7.9 7.5 7.9  Microalbumin mg/L - 5.0 - - -  Micro/Creat Ratio - - 6.5 - - -  Chol 100 - 199 mg/dL - 142 - 142 -  HDL >39 mg/dL - 36(L) - 30(L) -  Calc LDL 0 - 99 mg/dL - 67 - 71 -  Triglycerides 0 - 149 mg/dL - 193(H) - 203(H) -  Creatinine 0.76 - 1.27 mg/dL - 0.92 - 0.84 -   BP/Weight 06/03/2017 02/09/2017 02/01/2017 04/23/2016 7/41/6384  Systolic BP 98 536 468 032 122  Diastolic BP 60 64 62 56 56  Wt. (Lbs) 208 210 212.8 213 222.2  BMI 31.86 29.29 29.68 29.71 30.99   Foot/eye exam completion dates 10/02/2015 07/30/2014  Eye Exam - -  Foot Form Completion Done Done    Jerome Rodriguez  reports that he has been smoking cigars. He has never  used smokeless tobacco. He reports that he does not drink alcohol or use drugs.     Assessment & Plan:    Hypertension associated with diabetes ([place])  Hyperlipidemia associated with type 2 diabetes mellitus ([place])  Type 2 diabetes mellitus with complication, without long-term current use of insulin ([facility])  Obesity (BMI 30-39.9)   1. Rx changes: none 2. Education: Reviewed 'ABCs' of diabetes management (respective goals in parentheses):  A1C (<7), blood pressure (<130/80), and cholesterol (LDL <100). 3. Compliance at present is estimated to be good. Efforts to improve compliance (if necessary) will be directed at increased exercise. 4. Follow up: 4 months He might qualify for a research study through [place] .  We will also wait until he gets insurance at his request before we do any further follow-up in regard to health maintenance and immunizations

## 2017-11-01 ENCOUNTER — Ambulatory Visit (INDEPENDENT_AMBULATORY_CARE_PROVIDER_SITE_OTHER): Payer: Self-pay | Admitting: Cardiovascular Disease

## 2017-11-01 ENCOUNTER — Encounter: Payer: Self-pay | Admitting: Cardiovascular Disease

## 2017-11-01 DIAGNOSIS — I2510 Atherosclerotic heart disease of native coronary artery without angina pectoris: Secondary | ICD-10-CM

## 2017-11-01 DIAGNOSIS — E1159 Type 2 diabetes mellitus with other circulatory complications: Secondary | ICD-10-CM

## 2017-11-01 DIAGNOSIS — E1169 Type 2 diabetes mellitus with other specified complication: Secondary | ICD-10-CM

## 2017-11-01 DIAGNOSIS — E785 Hyperlipidemia, unspecified: Secondary | ICD-10-CM

## 2017-11-01 DIAGNOSIS — I152 Hypertension secondary to endocrine disorders: Secondary | ICD-10-CM

## 2017-11-01 DIAGNOSIS — I10 Essential (primary) hypertension: Secondary | ICD-10-CM

## 2017-11-01 MED ORDER — METOPROLOL SUCCINATE ER 25 MG PO TB24: 25.0000 mg | ORAL_TABLET | Freq: Every day | ORAL | 3 refills | Status: DC

## 2017-11-01 MED ORDER — ENALAPRIL MALEATE 2.5 MG PO TABS: 2.5000 mg | ORAL_TABLET | Freq: Every day | ORAL | 3 refills | Status: DC

## 2017-11-01 MED ORDER — NITROGLYCERIN 0.4 MG SL SUBL: 0.4000 mg | SUBLINGUAL_TABLET | SUBLINGUAL | 3 refills | Status: DC | PRN

## 2017-11-01 MED ORDER — ATORVASTATIN CALCIUM 20 MG PO TABS: 20.0000 mg | ORAL_TABLET | Freq: Every day | ORAL | 3 refills | Status: DC

## 2017-11-01 NOTE — Progress Notes (Signed)
Cardiology Office Note:    Date:  11/01/2017   ID:  Jerome Rodriguez, DOB 03-13-1955, MRN [id number]  PCP:  Denita Lung, MD  Cardiologist:  Sherren Mocha, MD  Electrophysiologist:  None   Referring MD: Denita Lung, MD   Chief Complaint  Patient presents with  . Coronary Artery Disease    History of Present Illness:    Jerome Rodriguez is a 62 y.o. male with a hx of coronary artery disease. He initially underwent stenting of the LAD in 2004. He presented in 2014 with symptoms of unstable angina and was found to have 99% stenosis within one of his LAD stents. He was treated with PCI using a single drug-eluting stent. He's been maintained on ASA and plavix.  He has Type II DM and is followed closely by Dr Redmond School.  The patient is here alone today.  He is been feeling well.  States his diabetes has been under better control with most recent hemoglobin A1c 7.5 mg/dL.  He continues to smoke cigars but describes it as "only a little bit."  He denies any chest pain, chest pressure, or shortness of breath.  He does some walking for exercise.  He denies heart palpitations, orthopnea, or PND.  He has some leg swelling when he sits for a long time.  He denies any calf claudication symptoms.   Past Medical History:  Diagnosis Date  . Abnormal chest CT    a. 07/2012: scattered bilat noncalcified pulm nodules ranging in size from a few mm to a max of 1m in LLL - rec f/u in 3-6 mos.  .Marland KitchenCAD (coronary artery disease)    a. NSTEMI 10/04 => LHC: mLAD 99%=> Taxus DES to mLAD and Taxus DES to dLAD;  b. ETT-Myoview 11/2011: normal, EF 73%, no ischemia; c. 07/2012 Cath/PCI: LM <10, LAD 90 ISRp/99 ISRd (3.0x38 Promus DES), LCX 235m30-40d, RCA 50-60p, 30d, EF 55-65%.  . Diabetes mellitus   . Dyslipidemia   . ED (erectile dysfunction)   . HTN (hypertension)   . Obesity   . Smoker    CIGARS    Past Surgical History:  Procedure Laterality Date  . APPENDECTOMY    . arthroscopic knee  surgery    . CORONARY STENT PLACEMENT    . LEFT HEART CATHETERIZATION WITH CORONARY ANGIOGRAM N/A 07/11/2012   Procedure: LEFT HEART CATHETERIZATION WITH CORONARY ANGIOGRAM;  Surgeon: Peter M JoMartiniqueMD;  [facility];  Service: Cardiovascular;  Laterality: N/A;  . right shoulder surgery      Current Medications: Current Meds  Medication Sig  . aspirin 81 MG EC tablet Take 1 tablet (81 mg total) by mouth daily.  . Marland Kitchentorvastatin (LIPITOR) 20 MG tablet Take 1 tablet (20 mg total) by mouth daily.  . Blood Glucose Monitoring Suppl (BLOOD GLUCOSE METER) kit Pt wants a Freestyle meter, lancets and test strips.  . clopidogrel (PLAVIX) 75 MG tablet Take 1 tablet (75 mg total) by mouth daily.  . enalapril (VASOTEC) 2.5 MG tablet Take 1 tablet (2.5 mg total) by mouth daily.  . Marland Kitchenlucose blood (TRUETEST TEST) test strip 1 each by Other route 2 (two) times daily. Use as instructed (This is for Ture test DX: E11.9)  . Lancets Misc. MISC 1 each by Does not apply route 2 (two) times daily. (This is for ture test DX: E11.9)  . loratadine (CLARITIN) 10 MG tablet Take 10 mg by mouth daily.  . metFORMIN (GLUCOPHAGE-XR) 750 MG 24 hr tablet  Take 1 tablet (750 mg total) by mouth 2 (two) times daily.  . metoprolol succinate (TOPROL-XL) 25 MG 24 hr tablet Take 1 tablet (25 mg total) by mouth daily.  . Multiple Vitamin (MULTIVITAMIN) tablet Take 1 tablet by mouth daily.    . nitroGLYCERIN (NITROSTAT) 0.4 MG SL tablet Place 1 tablet (0.4 mg total) under the tongue every 5 (five) minutes x 3 doses as needed for chest pain.  Marland Kitchen penicillin v potassium (VEETID) 500 MG tablet Take 1 tablet by mouth every 6 (six) hours.  . [DISCONTINUED] atorvastatin (LIPITOR) 20 MG tablet Take 1 tablet (20 mg total) by mouth daily.  . [DISCONTINUED] enalapril (VASOTEC) 2.5 MG tablet Take 1 tablet (2.5 mg total) by mouth daily.  . [DISCONTINUED] metoprolol succinate (TOPROL-XL) 25 MG 24 hr tablet Take 1 tablet (25 mg total) by mouth  daily.  . [DISCONTINUED] nitroGLYCERIN (NITROSTAT) 0.4 MG SL tablet Place 1 tablet (0.4 mg total) under the tongue every 5 (five) minutes x 3 doses as needed for chest pain.     Allergies:   Effient [prasugrel]; Lactose intolerance (gi); and Saxagliptin-metformin er   Social History   Socioeconomic History  . Marital status: Married    Spouse name: Not on file  . Number of children: Not on file  . Years of education: Not on file  . Highest education level: Not on file  Occupational History  . Occupation: taxi Animator Needs  . Financial resource strain: Not on file  . Food insecurity:    Worry: Not on file    Inability: Not on file  . Transportation needs:    Medical: Not on file    Non-medical: Not on file  Tobacco Use  . Smoking status: Light Tobacco Smoker    Types: Cigars  . Smokeless tobacco: Never Used  . Tobacco comment: smoke 1--d for 25 years; quit 6 months ago. is now smoking ciagrs occasionally   Substance and Sexual Activity  . Alcohol use: No    Comment: rare   . Drug use: No  . Sexual activity: Not on file  Lifestyle  . Physical activity:    Days per week: Not on file    Minutes per session: Not on file  . Stress: Not on file  Relationships  . Social connections:    Talks on phone: Not on file    Gets together: Not on file    Attends religious service: Not on file    Active member of club or organization: Not on file    Attends meetings of clubs or organizations: Not on file    Relationship status: Not on file  Other Topics Concern  . Not on file  Social History Narrative   Married; full time.     Family History: The patient's family history includes Diabetic kidney disease in his father and mother; Heart attack in his father and mother; Heart failure in his father.  ROS:   Please see the history of present illness.    All other systems reviewed and are negative.  EKGs/Labs/Other Studies Reviewed:    The following studies were reviewed  today: None  EKG:  EKG is not ordered today.    Recent Labs: 02/01/2017: ALT 11; BUN 11; Creatinine, Ser 0.92; Hemoglobin 13.4; Platelets 227; Potassium 4.0; Sodium 142  Recent Lipid Panel    Component Value Date/Time   CHOL 142 02/01/2017 1609   TRIG 193 (H) 02/01/2017 1609   HDL 36 (L) 02/01/2017 1609  CHOLHDL 3.9 02/01/2017 1609   CHOLHDL 4.7 10/02/2015 0913   VLDL 41 (H) 10/02/2015 0913   LDLCALC 67 02/01/2017 1609    Physical Exam:    VS:  BP 118/62   Pulse 72   Ht 5' 7.75" (1.721 m)   Wt 208 lb (94.3 kg)   SpO2 96%   BMI 31.86 kg/m     Wt Readings from Last 3 Encounters:  11/01/17 208 lb (94.3 kg)  10/22/17 206 lb (93.4 kg)  06/03/17 208 lb (94.3 kg)     GEN:  Well nourished, well developed in no acute distress HEENT: Normal NECK: No JVD; No carotid bruits LYMPHATICS: No lymphadenopathy CARDIAC: RRR, no murmurs, rubs, gallops RESPIRATORY:  Clear to auscultation without rales, wheezing or rhonchi  ABDOMEN: Soft, non-tender, non-distended MUSCULOSKELETAL:  No edema; No deformity  SKIN: Warm and dry NEUROLOGIC:  Alert and oriented x 3 PSYCHIATRIC:  Normal affect   ASSESSMENT:    1. Hyperlipidemia associated with type 2 diabetes mellitus ([place])   2. Hypertension associated with diabetes ([place])   3. Atherosclerosis of native coronary artery of native heart without angina pectoris    PLAN:    In order of problems listed above:  1. Last lipids reviewed and at goal with LDL cholesterol less than 70 mg/dL, treated with atorvastatin 2. No anginal symptoms.  He continues on long-term DAPT with aspirin and clopidogrel after recurrent ACS events 3. Blood pressure is well controlled on enalapril and metoprolol succinate 4. Complete tobacco cessation is advised, counseling done    Medication Adjustments/Labs and Tests Ordered: Current medicines are reviewed at length with the patient today.  Concerns regarding medicines are outlined above.  No orders of the  defined types were placed in this encounter.  Meds ordered this encounter  Medications  . atorvastatin (LIPITOR) 20 MG tablet    Sig: Take 1 tablet (20 mg total) by mouth daily.    Dispense:  90 tablet    Refill:  3  . enalapril (VASOTEC) 2.5 MG tablet    Sig: Take 1 tablet (2.5 mg total) by mouth daily.    Dispense:  90 tablet    Refill:  3  . metoprolol succinate (TOPROL-XL) 25 MG 24 hr tablet    Sig: Take 1 tablet (25 mg total) by mouth daily.    Dispense:  90 tablet    Refill:  3  . nitroGLYCERIN (NITROSTAT) 0.4 MG SL tablet    Sig: Place 1 tablet (0.4 mg total) under the tongue every 5 (five) minutes x 3 doses as needed for chest pain.    Dispense:  25 tablet    Refill:  3    Patient Instructions  Medication Instructions:  Your provider recommends that you continue on your current medications as directed. Please refer to the Current Medication list given to you today.    Labwork: None  Testing/Procedures: None  Follow-Up: Your provider wants you to follow-up in: 1 year with Dr. Burt Knack or his assistant. You will receive a reminder letter in the mail two months in advance. If you don't receive a letter, please call our office to schedule the follow-up appointment.    Any Other Special Instructions Will Be Listed Below (If Applicable).     If you need a refill on your cardiac medications before your next appointment, please call your pharmacy.      Signed, Sherren Mocha, MD  11/01/2017 1:23 PM    [facility]

## 2017-11-01 NOTE — Patient Instructions (Signed)
Medication Instructions:  Your provider recommends that you continue on your current medications as directed. Please refer to the Current Medication list given to you today.    Labwork: None  Testing/Procedures: None  Follow-Up: Your provider wants you to follow-up in: 1 year with Dr. Cooper or his assistant. You will receive a reminder letter in the mail two months in advance. If you don't receive a letter, please call our office to schedule the follow-up appointment.    Any Other Special Instructions Will Be Listed Below (If Applicable).     If you need a refill on your cardiac medications before your next appointment, please call your pharmacy.   

## 2017-11-05 ENCOUNTER — Encounter

## 2017-11-15 MED FILL — NITROGLYCERIN 0.4 MG TAB SL: 0.4 | 20 days supply | Qty: 25 | Fill #0

## 2017-11-30 MED FILL — CLOPIDOGREL 75 MG TABLET: 75 | 90 days supply | Qty: 90 | Fill #2

## 2017-12-13 MED FILL — ATORVASTATIN CALCIUM 20 MG: 20 | 90 days supply | Qty: 90 | Fill #2

## 2017-12-27 MED FILL — METOPROLOL SUCCINATE ER 25: 25 | 90 days supply | Qty: 90 | Fill #3

## 2017-12-31 MED FILL — ENALAPRIL MALEATE 2.5 MG TA: 2.5 | 90 days supply | Qty: 90 | Fill #3

## 2018-01-18 MED FILL — metFORMIN HCL ER 750 MG TB2: 750 | 90 days supply | Qty: 180 | Fill #1

## 2018-02-22 ENCOUNTER — Ambulatory Visit (INDEPENDENT_AMBULATORY_CARE_PROVIDER_SITE_OTHER): Payer: Self-pay | Admitting: Family Medicine

## 2018-02-22 ENCOUNTER — Encounter: Payer: Self-pay | Admitting: Family Medicine

## 2018-02-22 VITALS — BP 128/76 | HR 78 | Temp 98.2°F | Wt 218.0 lb

## 2018-02-22 DIAGNOSIS — E1169 Type 2 diabetes mellitus with other specified complication: Secondary | ICD-10-CM

## 2018-02-22 DIAGNOSIS — E785 Hyperlipidemia, unspecified: Secondary | ICD-10-CM

## 2018-02-22 DIAGNOSIS — I10 Essential (primary) hypertension: Secondary | ICD-10-CM

## 2018-02-22 DIAGNOSIS — E1159 Type 2 diabetes mellitus with other circulatory complications: Secondary | ICD-10-CM

## 2018-02-22 DIAGNOSIS — E1121 Type 2 diabetes mellitus with diabetic nephropathy: Secondary | ICD-10-CM

## 2018-02-22 DIAGNOSIS — E118 Type 2 diabetes mellitus with unspecified complications: Secondary | ICD-10-CM

## 2018-02-22 DIAGNOSIS — I2510 Atherosclerotic heart disease of native coronary artery without angina pectoris: Secondary | ICD-10-CM

## 2018-02-22 DIAGNOSIS — E669 Obesity, unspecified: Secondary | ICD-10-CM

## 2018-02-22 LAB — POCT GLYCOSYLATED HEMOGLOBIN (HGB A1C): Hemoglobin A1C: 8.4 % — AB (ref 4.0–5.6)

## 2018-02-22 LAB — POCT UA - MICROALBUMIN
Albumin/Creatinine Ratio, Urine, POC: 9.4
CREATININE, POC: 116.9 mg/dL
MICROALBUMIN (UR) POC: 11 mg/L

## 2018-02-22 NOTE — Progress Notes (Signed)
  Subjective:    Patient ID: Jerome Rodriguez, male    DOB: [birth year], 62 y.o.   MRN: [id number]  Jerome Rodriguez is a 63 y.o. male who presents for follow-up of Type 2 diabetes mellitus.  Patient is checking home blood sugars.   Home blood sugar records: meter records states they ran between 150 and 250. How often is blood sugars being checked: q three days Current symptoms/problems include none at this time. Daily foot checks:yes   Any foot concerns: issues with second toe on left foot Last eye exam: five years ago Exercise: off and on.  He admits to not being as physically active as he was in the past.  He recently saw cardiology.  He continues on atorvastatin, enalapril, metoprolol, Plavix.  He is also taking metformin.  He has not had to use his nitroglycerin. He continues to smoke cigars.   [name] the following portions of the patient's history were reviewed and updated as appropriate: allergies, current medications, past medical history, past social history and problem list.  ROS as in subjective above.     Objective:    Physical Exam Alert and in no distress foot exam is normal  A1c is 8.4 Lab Review Diabetic Labs Latest Ref Rng & Units 10/22/2017 06/03/2017 02/01/2017 02/04/2016 10/02/2015  HbA1c 4.0 - 5.6 % 7.5(A) 8.7(A) 8.4 7.9 7.5  Microalbumin mg/L - - 5.0 - -  Micro/Creat Ratio - - - 6.5 - -  Chol 100 - 199 mg/dL - - 142 - 142  HDL >39 mg/dL - - 36(L) - 30(L)  Calc LDL 0 - 99 mg/dL - - 67 - 71  Triglycerides 0 - 149 mg/dL - - 193(H) - 203(H)  Creatinine 0.76 - 1.27 mg/dL - - 0.92 - 0.84   BP/Weight 11/01/2017 10/22/2017 06/03/2017 02/09/2017 0/73/7106  Systolic BP 269 485 98 462 703  Diastolic BP 62 62 60 64 62  Wt. (Lbs) 208 206 208 210 212.8  BMI 31.86 31.55 31.86 29.29 29.68   Foot/eye exam completion dates 10/02/2015 07/30/2014  Eye Exam - -  Foot Form Completion Done Done    Jerome Rodriguez  reports that he has been smoking cigars. He has never used smokeless  tobacco. He reports that he does not drink alcohol or use drugs.     Assessment & Plan:    Type 2 diabetes mellitus with complication, without long-term current use of insulin (HCC) - Plan: CBC with Differential/Platelet, Comprehensive metabolic panel, Lipid panel  Hypertension associated with diabetes ([place]) - Plan: CBC with Differential/Platelet, Comprehensive metabolic panel  Hyperlipidemia associated with type 2 diabetes mellitus ([place]) - Plan: Lipid panel  Obesity (BMI 30-39.9) - Plan: CBC with Differential/Platelet, Comprehensive metabolic panel, Lipid panel  ASHD (arteriosclerotic heart disease) - Plan: CBC with Differential/Platelet, Comprehensive metabolic panel, Lipid panel  Diabetic nephropathy with proteinuria (HCC) - Plan: POCT UA - Microalbumin   1. Rx changes: none 2. Education: Reviewed 'ABCs' of diabetes management (respective goals in parentheses):  A1C (<7), blood pressure (<130/80), and cholesterol (LDL <100). 3. Compliance at present is estimated to be poor. Efforts to improve compliance (if necessary) will be directed at increased exercise.  I discussed adding another medication to his regimen or readjusting the metformin but he prefers to not do that and plans to work harder on increasing his physical activity. 4. Follow up: 4 months

## 2018-02-23 LAB — CBC WITH DIFFERENTIAL/PLATELET
BASOS: 1 %
Basophils Absolute: 0.1 10*3/uL (ref 0.0–0.2)
EOS (ABSOLUTE): 0.4 10*3/uL (ref 0.0–0.4)
Eos: 5 %
HEMATOCRIT: 43.4 % (ref 37.5–51.0)
HEMOGLOBIN: 14.6 g/dL (ref 13.0–17.7)
IMMATURE GRANS (ABS): 0.1 10*3/uL (ref 0.0–0.1)
Immature Granulocytes: 1 %
LYMPHS: 27 %
Lymphocytes Absolute: 2.2 10*3/uL (ref 0.7–3.1)
MCH: 29.7 pg (ref 26.6–33.0)
MCHC: 33.6 g/dL (ref 31.5–35.7)
MCV: 88 fL (ref 79–97)
MONOCYTES: 9 %
Monocytes Absolute: 0.7 10*3/uL (ref 0.1–0.9)
NEUTROS ABS: 4.6 10*3/uL (ref 1.4–7.0)
Neutrophils: 57 %
Platelets: 256 10*3/uL (ref 150–450)
RBC: 4.92 x10E6/uL (ref 4.14–5.80)
RDW: 13.2 % (ref 11.6–15.4)
WBC: 8 10*3/uL (ref 3.4–10.8)

## 2018-02-23 LAB — COMPREHENSIVE METABOLIC PANEL
A/G RATIO: 2.1 (ref 1.2–2.2)
ALBUMIN: 4.5 g/dL (ref 3.8–4.8)
ALT: 14 IU/L (ref 0–44)
AST: 15 IU/L (ref 0–40)
Alkaline Phosphatase: 136 IU/L — ABNORMAL HIGH (ref 39–117)
BILIRUBIN TOTAL: 0.4 mg/dL (ref 0.0–1.2)
BUN / CREAT RATIO: 18 (ref 10–24)
BUN: 18 mg/dL (ref 8–27)
CHLORIDE: 98 mmol/L (ref 96–106)
CO2: 23 mmol/L (ref 20–29)
Calcium: 9 mg/dL (ref 8.6–10.2)
Creatinine, Ser: 0.99 mg/dL (ref 0.76–1.27)
GFR calc non Af Amer: 81 mL/min/{1.73_m2} (ref >59)
GFR, EST AFRICAN AMERICAN: 93 mL/min/{1.73_m2} (ref >59)
GLOBULIN, TOTAL: 2.1 g/dL (ref 1.5–4.5)
Glucose: 214 mg/dL — ABNORMAL HIGH (ref 65–99)
Potassium: 5.1 mmol/L (ref 3.5–5.2)
Sodium: 136 mmol/L (ref 134–144)
TOTAL PROTEIN: 6.6 g/dL (ref 6.0–8.5)

## 2018-02-23 LAB — LIPID PANEL
CHOLESTEROL TOTAL: 163 mg/dL (ref 100–199)
Chol/HDL Ratio: 4 ratio (ref 0.0–5.0)
HDL: 41 mg/dL (ref >39)
LDL Calculated: 87 mg/dL (ref 0–99)
Triglycerides: 173 mg/dL — ABNORMAL HIGH (ref 0–149)
VLDL CHOLESTEROL CAL: 35 mg/dL (ref 5–40)

## 2018-03-01 ENCOUNTER — Other Ambulatory Visit: Payer: Self-pay | Admitting: Physician Assistant

## 2018-03-01 ENCOUNTER — Other Ambulatory Visit: Payer: Self-pay | Admitting: Cardiovascular Disease

## 2018-03-01 MED ORDER — CLOPIDOGREL BISULFATE 75 MG PO TABS: 75.0000 mg | ORAL_TABLET | Freq: Every day | ORAL | 2 refills | Status: DC

## 2018-03-01 MED FILL — CLOPIDOGREL 75 MG TABLET: 75 | 90 days supply | Qty: 90 | Fill #0 | Status: TO

## 2018-03-01 NOTE — Telephone Encounter (Signed)
Pt's medication was sent to pt's pharmacy as requested. Confirmation received.  °

## 2018-03-16 ENCOUNTER — Telehealth: Payer: Self-pay | Admitting: Family Medicine

## 2018-03-16 DIAGNOSIS — E785 Hyperlipidemia, unspecified: Principal | ICD-10-CM

## 2018-03-16 DIAGNOSIS — E1169 Type 2 diabetes mellitus with other specified complication: Secondary | ICD-10-CM

## 2018-03-16 MED ORDER — ATORVASTATIN CALCIUM 20 MG PO TABS: 20.0000 mg | ORAL_TABLET | Freq: Every day | ORAL | 3 refills | Status: DC

## 2018-03-16 MED FILL — ATORVASTATIN 20 MG TABLET: 20 | 90 days supply | Qty: 90 | Fill #0

## 2018-03-16 NOTE — Telephone Encounter (Signed)
Pt needs refill on Atorvastatin called in to [facility]

## 2018-03-31 MED FILL — ENALAPRIL MALEATE 2.5 MG TA: 2.5 | 90 days supply | Qty: 90 | Fill #0

## 2018-03-31 MED FILL — METOPROLOL SUCCINATE ER 25: 25 | 90 days supply | Qty: 90 | Fill #0

## 2018-03-31 MED FILL — metFORMIN HCL ER 750 MG TB2: 750 | 30 days supply | Qty: 60 | Fill #0

## 2018-05-13 ENCOUNTER — Telehealth: Payer: Self-pay | Admitting: Family Medicine

## 2018-05-13 MED ORDER — METFORMIN HCL ER 750 MG PO TB24: 750.0000 mg | ORAL_TABLET | Freq: Two times a day (BID) | ORAL | 0 refills | Status: DC

## 2018-05-13 NOTE — Telephone Encounter (Signed)
done

## 2018-05-13 NOTE — Telephone Encounter (Signed)
Needs refill on metformin  90 day supply He has diabetes check scheduled for 06/27/2018  Send to  [facility]

## 2018-05-21 MED FILL — metFORMIN HCL ER 750 MG TB2: 750 | 90 days supply | Qty: 180 | Fill #0

## 2018-05-23 MED FILL — CLOPIDOGREL 75 MG TABLET: 75 | 90 days supply | Qty: 90 | Fill #0

## 2018-06-14 MED FILL — ATORVASTATIN 20 MG TABLET: 20 | 90 days supply | Qty: 90 | Fill #1

## 2018-06-27 ENCOUNTER — Encounter: Payer: Self-pay | Admitting: Family Medicine

## 2018-06-27 ENCOUNTER — Ambulatory Visit (INDEPENDENT_AMBULATORY_CARE_PROVIDER_SITE_OTHER): Payer: Self-pay | Admitting: Family Medicine

## 2018-06-27 ENCOUNTER — Other Ambulatory Visit: Payer: Self-pay

## 2018-06-27 VITALS — BP 98/68 | HR 77 | Temp 98.5°F | Wt 209.0 lb

## 2018-06-27 DIAGNOSIS — E118 Type 2 diabetes mellitus with unspecified complications: Secondary | ICD-10-CM

## 2018-06-27 DIAGNOSIS — E1159 Type 2 diabetes mellitus with other circulatory complications: Secondary | ICD-10-CM

## 2018-06-27 DIAGNOSIS — I10 Essential (primary) hypertension: Secondary | ICD-10-CM

## 2018-06-27 DIAGNOSIS — I2510 Atherosclerotic heart disease of native coronary artery without angina pectoris: Secondary | ICD-10-CM

## 2018-06-27 DIAGNOSIS — I152 Hypertension secondary to endocrine disorders: Secondary | ICD-10-CM

## 2018-06-27 LAB — POCT GLYCOSYLATED HEMOGLOBIN (HGB A1C): Hemoglobin A1C: 8.4 % — AB (ref 4.0–5.6)

## 2018-06-27 MED ORDER — ENALAPRIL MALEATE 2.5 MG PO TABS: 2.5000 mg | ORAL_TABLET | Freq: Every day | ORAL | 3 refills | Status: DC

## 2018-06-27 MED ORDER — METOPROLOL SUCCINATE ER 25 MG PO TB24: 25.0000 mg | ORAL_TABLET | Freq: Every day | ORAL | 3 refills | Status: DC

## 2018-06-27 MED FILL — ENALAPRIL MALEATE 2.5 MG TA: 2.5 | 90 days supply | Qty: 90 | Fill #0

## 2018-06-27 MED FILL — METOPROLOL SUCCINATE ER 25: 25 | 90 days supply | Qty: 90 | Fill #0

## 2018-06-27 NOTE — Progress Notes (Signed)
  Subjective:    Patient ID: Jerome Rodriguez, male    DOB: Dec 07, 1955, 63 y.o.   MRN: [id number]  Jerome Rodriguez is a 63 y.o. male who presents for follow-up of Type 2 diabetes mellitus.  Patient is checking home blood sugars.   Home blood sugar records: meter records How often is blood sugars being checked: BID fasting and two hours post meal 114-289 Current symptoms/problems include none at this time . Daily foot checks: yes   Any foot concerns: none Last eye exam: over two years Exercise: walking a lot He states that he has made some dietary changes and has lost 9 pounds. The following portions of the patient's history were reviewed and updated as appropriate: allergies, current medications, past medical history, past social history and problem list.  ROS as in subjective above.     Objective:    Physical Exam Alert and in no distress otherwise not examined.   Lab Review Diabetic Labs Latest Ref Rng & Units 02/22/2018 10/22/2017 06/03/2017 02/01/2017 02/04/2016  HbA1c 4.0 - 5.6 % 8.4(A) 7.5(A) 8.7(A) 8.4 7.9  Microalbumin mg/L 11.0 - - 5.0 -  Micro/Creat Ratio - 9.4 - - 6.5 -  Chol 100 - 199 mg/dL 163 - - 142 -  HDL >39 mg/dL 41 - - 36(L) -  Calc LDL 0 - 99 mg/dL 87 - - 67 -  Triglycerides 0 - 149 mg/dL 173(H) - - 193(H) -  Creatinine 0.76 - 1.27 mg/dL 0.99 - - 0.92 -   BP/Weight 02/22/2018 11/01/2017 10/22/2017 0/03/7046 08/13/9167  Systolic BP 450 388 828 98 003  Diastolic BP 76 62 62 60 64  Wt. (Lbs) 218 208 206 208 210  BMI 33.39 31.86 31.55 31.86 29.29   Foot/eye exam completion dates 02/22/2018 10/02/2015  Eye Exam - -  Foot Form Completion Done Done    Jerome Rodriguez  reports that he has been smoking cigars. He has never used smokeless tobacco. He reports that he does not drink alcohol or use drugs. Hemoglobin A1c 8.4    Assessment & Plan:    Type 2 diabetes mellitus with complication, without long-term current use of insulin ([place]) - Plan: POCT glycosylated  hemoglobin (Hb A1C),   Hypertension associated with diabetes ([place]) - Plan: metoprolol succinate (TOPROL-XL) 25 MG 24 hr tablet, enalapril (VASOTEC) 2.5 MG tablet,   Atherosclerosis of native coronary artery of native heart without angina pectoris - Plan: metoprolol succinate (TOPROL-XL) 25 MG 24 hr tablet, follow-up as needed  Type 2 diabetes with complication ([place]) -    1. Rx changes: none 2. Education: Reviewed 'ABCs' of diabetes management (respective goals in parentheses):  A1C (<7), blood pressure (<130/80), and cholesterol (LDL <100). 3. Compliance at present is estimated to be fair. Efforts to improve compliance (if necessary) will be directed at dietary modifications: Continue to cut back on carbohydrates. 4. Follow up: 4 months  5. I again discussed the idea of placing him on another medication however he does not have insurance and does not want to consider this.  He is going to redouble his efforts to cut back on his carbohydrates.

## 2018-09-01 ENCOUNTER — Other Ambulatory Visit: Payer: Self-pay | Admitting: Family Medicine

## 2018-09-01 ENCOUNTER — Telehealth: Payer: Self-pay | Admitting: Family Medicine

## 2018-09-01 MED FILL — CLOPIDOGREL 75 MG TABLET: 75 | 90 days supply | Qty: 90 | Fill #0

## 2018-09-01 NOTE — Telephone Encounter (Signed)
Pharmacy states that metformin is on back order please change strength and directions as necessary and resend to [facility]

## 2018-09-01 NOTE — Telephone Encounter (Signed)
Lets see if another place has metformin

## 2018-09-02 ENCOUNTER — Telehealth: Payer: Self-pay | Admitting: Family Medicine

## 2018-09-02 MED ORDER — METFORMIN HCL 1000 MG PO TABS: 1000.0000 mg | ORAL_TABLET | Freq: Two times a day (BID) | ORAL | 1 refills | Status: DC

## 2018-09-02 MED FILL — metFORMIN HCL 1000 MG TABS: 1000 | 90 days supply | Qty: 180 | Fill #0

## 2018-09-02 NOTE — Telephone Encounter (Signed)
Pt called and states that pharmacy does not have metformin and is unsure of when it will be back in stock. He is requesting something else be sent in for him. Pt uses [facility] and can be reached at [phone].

## 2018-09-05 NOTE — Telephone Encounter (Signed)
Pt wa supplied was 1000 mg metformin per pharmacy tech [place] . [place]

## 2018-09-15 MED FILL — ATORVASTATIN 20 MG TABLET: 20 | 90 days supply | Qty: 90 | Fill #2

## 2018-09-19 ENCOUNTER — Telehealth: Payer: Self-pay | Admitting: Family Medicine

## 2018-09-19 NOTE — Telephone Encounter (Signed)
Pt  Says he was on the 750 ER sand it works better than the reg 100 mg. Pt would like a script sent to [facility]. [place]. Please advise. [place]

## 2018-09-19 NOTE — Telephone Encounter (Signed)
Pt wants to change from metformin 1000mg  to 750 ER. Please advise [facility]

## 2018-09-19 NOTE — Telephone Encounter (Signed)
If he is having problems with his blood sugar he needs to make an appointment

## 2018-09-19 NOTE — Telephone Encounter (Signed)
Pt called and states that the new metformin 1000 mg is not working well to control his sugars, He was able to locate the 750 ER at [facility]. Pt can be reached at [phone].

## 2018-09-20 NOTE — Telephone Encounter (Signed)
PT is coming in Thursday for Diabetes check

## 2018-09-20 NOTE — Telephone Encounter (Signed)
This makes no sense.  Maybe we should set up a virtual visit with him and have him give me the numbers that he is seeing.

## 2018-09-22 ENCOUNTER — Other Ambulatory Visit: Payer: Self-pay

## 2018-09-22 ENCOUNTER — Telehealth: Payer: Self-pay

## 2018-09-22 ENCOUNTER — Ambulatory Visit (INDEPENDENT_AMBULATORY_CARE_PROVIDER_SITE_OTHER): Payer: Self-pay | Admitting: Family Medicine

## 2018-09-22 ENCOUNTER — Encounter: Payer: Self-pay | Admitting: Family Medicine

## 2018-09-22 VITALS — BP 126/80 | HR 87 | Temp 98.4°F | Wt 210.4 lb

## 2018-09-22 DIAGNOSIS — E118 Type 2 diabetes mellitus with unspecified complications: Secondary | ICD-10-CM

## 2018-09-22 DIAGNOSIS — R319 Hematuria, unspecified: Secondary | ICD-10-CM

## 2018-09-22 LAB — POCT URINALYSIS DIP (PROADVANTAGE DEVICE)
Bilirubin, UA: NEGATIVE
Glucose, UA: NEGATIVE mg/dL
Ketones, POC UA: NEGATIVE mg/dL
Leukocytes, UA: NEGATIVE
Nitrite, UA: NEGATIVE
Protein Ur, POC: NEGATIVE mg/dL
Specific Gravity, Urine: 1.015
Urobilinogen, Ur: 0.2
pH, UA: 6 (ref 5.0–8.0)

## 2018-09-22 MED ORDER — METFORMIN HCL ER 750 MG PO TB24: 750.0000 mg | ORAL_TABLET | Freq: Every day | ORAL | 1 refills | Status: DC

## 2018-09-22 MED ORDER — SULFAMETHOXAZOLE-TRIMETHOPRIM 800-160 MG PO TABS: 1.0000 | ORAL_TABLET | Freq: Two times a day (BID) | ORAL | 0 refills | Status: DC

## 2018-09-22 NOTE — Telephone Encounter (Signed)
Pt called and advised that his script for metformin has been 1 tablet BID please. Advised pt to have a follow up . [place]

## 2018-09-22 NOTE — Progress Notes (Signed)
   Subjective:    Patient ID: Jerome Rodriguez, male    DOB: 10-18-1955, 63 y.o.   MRN: [id number]  HPI He is here for consult concerning his diabetes.  Since switching to the thousand milligram metformin he has had difficulty with fatigue, headache, abdominal gas, pain and some diarrhea.  Since then he is also made some dietary as well as exercise changes.  He would like to switch back to his previous dosing of metformin.  He also complains of urinary urgency and frequency. Review of Systems     Objective:   Physical Exam   Alert and in no distress.  Urine dipstick and microscopic did show red blood cells. Assessment & Plan:  Type 2 diabetes with complication (HCC) - Plan: metFORMIN (GLUCOPHAGE XR) 750 MG 24 hr tablet  Hematuria, unspecified type - Plan: sulfamethoxazole-trimethoprim (BACTRIM DS) 800-160 MG tablet, Urine Culture I encouraged him to continue with his diet and exercise regimen.  He is to return here in 2 weeks for a recheck on his urine.  If continued difficulty I will probably refer to urology.

## 2018-09-22 NOTE — Addendum Note (Signed)
Addended by: Elyse Jarvis on: 09/22/2018 03:54 PM   Modules accepted: Orders

## 2018-09-23 LAB — URINE CULTURE

## 2018-09-26 ENCOUNTER — Other Ambulatory Visit: Payer: Self-pay | Admitting: Family Medicine

## 2018-09-26 ENCOUNTER — Telehealth: Payer: Self-pay | Admitting: Family Medicine

## 2018-09-26 MED ORDER — CIPROFLOXACIN HCL 500 MG PO TABS: 500.0000 mg | ORAL_TABLET | Freq: Two times a day (BID) | ORAL | 0 refills | Status: DC

## 2018-09-26 MED FILL — CIPROFLOXACIN HCL 500 MG TA: 500 | 10 days supply | Qty: 20 | Fill #0

## 2018-09-26 NOTE — Telephone Encounter (Signed)
Pt called and states he has been up all night, the antibiotic has given him a stomach ache and can't sleep. 1.  Pt wants to know why you gave him Batrim when there is a caution on this meds not to take if you are taking Enalipril? 2.  Pt wants a different antibiotic sent to [place].  Pt wants you to call him at [phone]

## 2018-09-26 NOTE — Telephone Encounter (Signed)
LVM for pt to call back to office. [place] 

## 2018-09-26 NOTE — Telephone Encounter (Signed)
Explained to him that that problem is very rare.  I am more concerned that it caused stomachache.  I will switch him to Cipro

## 2018-09-27 NOTE — Telephone Encounter (Signed)
LVM message for pt to call back to office . [place]

## 2018-09-28 NOTE — Telephone Encounter (Signed)
Pt says he is doing much better. [place]

## 2018-10-04 ENCOUNTER — Other Ambulatory Visit: Payer: Self-pay

## 2018-10-05 MED FILL — ENALAPRIL MALEATE 2.5 MG TA: 2.5 | 90 days supply | Qty: 90 | Fill #1

## 2018-10-05 MED FILL — METOPROLOL SUCCINATE ER 25: 25 | 90 days supply | Qty: 90 | Fill #1

## 2018-10-07 ENCOUNTER — Other Ambulatory Visit: Payer: Self-pay

## 2018-10-07 ENCOUNTER — Other Ambulatory Visit (INDEPENDENT_AMBULATORY_CARE_PROVIDER_SITE_OTHER): Payer: Self-pay

## 2018-10-07 DIAGNOSIS — R319 Hematuria, unspecified: Secondary | ICD-10-CM

## 2018-10-07 LAB — POCT URINALYSIS DIP (PROADVANTAGE DEVICE)
Bilirubin, UA: NEGATIVE
Glucose, UA: NEGATIVE mg/dL
Ketones, POC UA: NEGATIVE mg/dL
Leukocytes, UA: NEGATIVE
Nitrite, UA: NEGATIVE
Protein Ur, POC: NEGATIVE mg/dL
Specific Gravity, Urine: 1.015
Urobilinogen, Ur: NEGATIVE
pH, UA: 6 (ref 5.0–8.0)

## 2018-10-31 ENCOUNTER — Ambulatory Visit: Payer: Self-pay | Admitting: Family Medicine

## 2018-11-30 ENCOUNTER — Other Ambulatory Visit: Payer: Self-pay | Admitting: Cardiovascular Disease

## 2018-11-30 MED FILL — CLOPIDOGREL 75 MG TABLET: 75 | 90 days supply | Qty: 90 | Fill #0

## 2018-12-16 MED FILL — ATORVASTATIN 20 MG TABLET: 20 | 90 days supply | Qty: 90 | Fill #0

## 2018-12-22 ENCOUNTER — Encounter: Payer: Self-pay | Admitting: Family Medicine

## 2018-12-22 ENCOUNTER — Ambulatory Visit (INDEPENDENT_AMBULATORY_CARE_PROVIDER_SITE_OTHER): Payer: Self-pay | Admitting: Family Medicine

## 2018-12-22 ENCOUNTER — Other Ambulatory Visit: Payer: Self-pay

## 2018-12-22 VITALS — BP 128/80 | HR 90 | Temp 97.3°F | Wt 213.4 lb

## 2018-12-22 DIAGNOSIS — I10 Essential (primary) hypertension: Secondary | ICD-10-CM

## 2018-12-22 DIAGNOSIS — I152 Hypertension secondary to endocrine disorders: Secondary | ICD-10-CM

## 2018-12-22 DIAGNOSIS — E118 Type 2 diabetes mellitus with unspecified complications: Secondary | ICD-10-CM

## 2018-12-22 DIAGNOSIS — E669 Obesity, unspecified: Secondary | ICD-10-CM

## 2018-12-22 DIAGNOSIS — I2510 Atherosclerotic heart disease of native coronary artery without angina pectoris: Secondary | ICD-10-CM

## 2018-12-22 DIAGNOSIS — R319 Hematuria, unspecified: Secondary | ICD-10-CM

## 2018-12-22 DIAGNOSIS — E1159 Type 2 diabetes mellitus with other circulatory complications: Secondary | ICD-10-CM

## 2018-12-22 DIAGNOSIS — E1169 Type 2 diabetes mellitus with other specified complication: Secondary | ICD-10-CM

## 2018-12-22 DIAGNOSIS — E785 Hyperlipidemia, unspecified: Secondary | ICD-10-CM

## 2018-12-22 LAB — POCT GLYCOSYLATED HEMOGLOBIN (HGB A1C): Hemoglobin A1C: 8.2 % — AB (ref 4.0–5.6)

## 2018-12-22 NOTE — Progress Notes (Signed)
  Subjective:    Patient ID: Jerome Rodriguez, male    DOB: 13-Jun-1955, 63 y.o.   MRN: [id number]  Jerome Rodriguez is a 63 y.o. male who presents for follow-up of Type 2 diabetes mellitus.  Home blood sugar records: Not recently checked YES average around 150-170 Current symptoms/problems include none and have been unchanged. No problems Daily foot checks:   Any foot concerns: no checking feet daily no prblems Exercise: Walking usually 3 times per week.  YES walking but not as much as before Diet: eating well  He continues on metformin and is having no difficulty with that.  He also is taking Plavix and Vasotec and metoprolol.  He does have an underlying history of coronary artery disease but has not followed up with cardiology in quite some time.  He would like to wait till after he gets on Medicare.  His allergies seem to be under good control.  He does have a history of hematuria but has not followed up with urology. The following portions of the patient's history were reviewed and updated as appropriate: allergies, current medications, past medical history, past social history and problem list.  ROS as in subjective above.     Objective:    Physical Exam Alert and in no distress otherwise not examined.  Lab Review Diabetic Labs Latest Ref Rng & Units 06/27/2018 02/22/2018 10/22/2017 06/03/2017 02/01/2017  HbA1c 4.0 - 5.6 % 8.4(A) 8.4(A) 7.5(A) 8.7(A) 8.4  Microalbumin mg/L - 11.0 - - 5.0  Micro/Creat Ratio - - 9.4 - - 6.5  Chol 100 - 199 mg/dL - 163 - - 142  HDL >39 mg/dL - 41 - - 36(L)  Calc LDL 0 - 99 mg/dL - 87 - - 67  Triglycerides 0 - 149 mg/dL - 173(H) - - 193(H)  Creatinine 0.76 - 1.27 mg/dL - 0.99 - - 0.92   BP/Weight 09/22/2018 06/27/2018 02/22/2018 11/01/2017 0000000  Systolic BP 123XX123 98 0000000 123456 123XX123  Diastolic BP 80 68 76 62 62  Wt. (Lbs) 210.4 209 218 208 206  BMI 32.23 32.01 33.39 31.86 31.55   Foot/eye exam completion dates 02/22/2018 10/02/2015  Eye Exam - -    Foot Form Completion Done Done   Hemoglobin A1c is 8.2 Jerome Rodriguez  reports that he has been smoking cigars. He has never used smokeless tobacco. He reports that he does not drink alcohol or use drugs.     Assessment & Plan:    Type 2 diabetes with complication (Jerome Rodriguez) - Plan: HgB A1c  Hypertension associated with diabetes (Jerome Rodriguez)  Atherosclerosis of native coronary artery of native heart without angina pectoris  Hyperlipidemia associated with type 2 diabetes mellitus (HCC)  Obesity (BMI 30-39.9)  Hematuria, unspecified type    1. Rx changes: none 2. Education: Reviewed 'ABCs' of diabetes management (respective goals in parentheses):  A1C (<7), blood pressure (<130/80), and cholesterol (LDL <100). 3. Compliance at present is estimated to be fair. Efforts to improve compliance (if necessary) will be directed at increased exercise. 4. Follow up: 4 months  He will go on Medicare next year and would like to wait till then before any further evaluation and work-up concerning his hematuria as well as adding another diabetes medication.  Discussed the possibility of a clinic him on an SGLT.

## 2019-01-05 MED FILL — ENALAPRIL MALEATE 2.5 MG TA: 2.5 | 90 days supply | Qty: 90 | Fill #0

## 2019-01-05 MED FILL — METOPROLOL SUCCINATE ER 25: 25 | 90 days supply | Qty: 90 | Fill #0

## 2019-03-06 ENCOUNTER — Other Ambulatory Visit: Payer: Self-pay | Admitting: Cardiovascular Disease

## 2019-03-06 MED FILL — CLOPIDOGREL 75 MG TABLET: 75 | 30 days supply | Qty: 30 | Fill #0

## 2019-03-20 ENCOUNTER — Other Ambulatory Visit: Payer: Self-pay | Admitting: Family Medicine

## 2019-03-20 DIAGNOSIS — E785 Hyperlipidemia, unspecified: Secondary | ICD-10-CM

## 2019-03-20 DIAGNOSIS — E1169 Type 2 diabetes mellitus with other specified complication: Secondary | ICD-10-CM

## 2019-03-20 MED FILL — ATORVASTATIN 20 MG TABLET: 20 | 90 days supply | Qty: 90 | Fill #0

## 2019-03-27 ENCOUNTER — Ambulatory Visit: Payer: Self-pay | Attending: Internal Medicine

## 2019-03-27 DIAGNOSIS — Z23 Encounter for immunization: Secondary | ICD-10-CM

## 2019-03-27 NOTE — Progress Notes (Signed)
   Covid-19 Vaccination Clinic  Name:  Jerome Rodriguez    MRN: [id number] DOB: 07/19/1955  03/27/2019  [name] was observed post Covid-19 immunization for 15 minutes without incident. He was provided with Vaccine Information Sheet and instruction to access the V-Safe system.   [name] was instructed to call 911 with any severe reactions post vaccine: Marland Kitchen Difficulty breathing  . Swelling of face and throat  . A fast heartbeat  . A bad rash all over body  . Dizziness and weakness   Immunizations Administered    Name Date Dose VIS Date Route   Pfizer COVID-19 Vaccine 03/27/2019 11:47 AM 0.3 mL 12/16/2018 Intramuscular   Manufacturer: [place]   Lot: [id number]   [place]: [id number]

## 2019-04-04 ENCOUNTER — Other Ambulatory Visit: Payer: Self-pay | Admitting: Cardiovascular Disease

## 2019-04-04 MED FILL — METOPROLOL SUCCINATE ER 25: 25 | 90 days supply | Qty: 90 | Fill #1

## 2019-04-04 MED FILL — CLOPIDOGREL 75 MG TABLET: 75 | 30 days supply | Qty: 30 | Fill #0

## 2019-04-05 ENCOUNTER — Other Ambulatory Visit: Payer: Self-pay | Admitting: Family Medicine

## 2019-04-05 DIAGNOSIS — E118 Type 2 diabetes mellitus with unspecified complications: Secondary | ICD-10-CM

## 2019-04-05 MED FILL — ENALAPRIL MALEATE 2.5 MG TA: 2.5 | 90 days supply | Qty: 90 | Fill #1

## 2019-04-12 ENCOUNTER — Other Ambulatory Visit: Payer: Self-pay

## 2019-04-12 ENCOUNTER — Ambulatory Visit (INDEPENDENT_AMBULATORY_CARE_PROVIDER_SITE_OTHER): Payer: Self-pay | Admitting: Physician Assistant

## 2019-04-12 ENCOUNTER — Encounter: Payer: Self-pay | Admitting: Physician Assistant

## 2019-04-12 VITALS — BP 120/60 | HR 88 | Ht 68.0 in | Wt 217.4 lb

## 2019-04-12 DIAGNOSIS — R0600 Dyspnea, unspecified: Secondary | ICD-10-CM

## 2019-04-12 DIAGNOSIS — I2510 Atherosclerotic heart disease of native coronary artery without angina pectoris: Secondary | ICD-10-CM

## 2019-04-12 DIAGNOSIS — I10 Essential (primary) hypertension: Secondary | ICD-10-CM

## 2019-04-12 DIAGNOSIS — E785 Hyperlipidemia, unspecified: Secondary | ICD-10-CM

## 2019-04-12 DIAGNOSIS — E1159 Type 2 diabetes mellitus with other circulatory complications: Secondary | ICD-10-CM

## 2019-04-12 DIAGNOSIS — R0609 Other forms of dyspnea: Secondary | ICD-10-CM

## 2019-04-12 DIAGNOSIS — E1169 Type 2 diabetes mellitus with other specified complication: Secondary | ICD-10-CM

## 2019-04-12 NOTE — Patient Instructions (Signed)
Medication Instructions:  Your physician recommends that you continue on your current medications as directed. Please refer to the Current Medication list given to you today.  *If you need a refill on your cardiac medications before your next appointment, please call your pharmacy*   Lab Work: None orderd  If you have labs (blood work) drawn today and your tests are completely normal, you will receive your results only by: Marland Kitchen MyChart Message (if you have MyChart) OR . A paper copy in the mail If you have any lab test that is abnormal or we need to change your treatment, we will call you to review the results.   Testing/Procedures: None ordered   Follow-Up: At [facility], you and your health needs are our priority.  As part of our continuing mission to provide you with exceptional heart care, we have created designated Provider Care Teams.  These Care Teams include your primary Cardiologist (physician) and Advanced Practice Providers (APPs -  Physician Assistants and Nurse Practitioners) who all work together to provide you with the care you need, when you need it.  We recommend signing up for the patient portal called "MyChart".  Sign up information is provided on this After Visit Summary.  MyChart is used to connect with patients for Virtual Visits (Telemedicine).  Patients are able to view lab/test results, encounter notes, upcoming appointments, etc.  Non-urgent messages can be sent to your provider as well.   To learn more about what you can do with MyChart, go to [email or web address].    Your next appointment:   12 month(s)  The format for your next appointment:   In Person  Provider:   You may see Sherren Mocha, MD or one of the following Advanced Practice Providers on your designated Care Team:    Richardson Dopp, PA-C  [place], [place]  Daune Perch, NP    Other Instructions

## 2019-04-12 NOTE — Progress Notes (Signed)
Cardiology Office Note    Date:  04/12/2019   ID:  KELVIN SENNETT, DOB 1955-02-06, MRN [id number]  PCP:  Denita Lung, MD  Cardiologist: Dr. Burt Knack   Chief Complaint: 18 Months follow up  History of Present Illness:   Jerome Rodriguez is a 64 y.o. male with hx of CAD, HLD, DM, HTN and tobacco abuse presents for follow up.   Hx of coronary artery disease. He initially underwent stenting of the LAD in 2004. He presented in 2014 with symptoms of unstable angina and was found to have 99% in-stent restenosis within one of his LAD stents. He was treated with PCI using a single drug-eluting stent.He's been maintained on ASA and plavix.   He was doing well when seen by Dr. Burt Knack 10/2017.  Here today for follow-up.  He gained approximately 15 pounds of weight in past few months due to inactivity.  Since then he has noted shortness of breath with heavy lifting like walking with 5 gallon of water bottle.  He has started walking approximately 1 to 2 miles every day for past few days without any shortness of breath.  He denies chest pain.  His current symptoms is different than prior angina when he had stenting.  Compliant with his medication.  No orthopnea, PND, syncope, lower extremity edema or melena.   Past Medical History:  Diagnosis Date  . Abnormal chest CT    a. 07/2012: scattered bilat noncalcified pulm nodules ranging in size from a few mm to a max of 33m in LLL - rec f/u in 3-6 mos.  .Marland KitchenCAD (coronary artery disease)    a. NSTEMI 10/04 => LHC: mLAD 99%=> Taxus DES to mLAD and Taxus DES to dLAD;  b. ETT-Myoview 11/2011: normal, EF 73%, no ischemia; c. 07/2012 Cath/PCI: LM <10, LAD 90 ISRp/99 ISRd (3.0x38 Promus DES), LCX 256m30-40d, RCA 50-60p, 30d, EF 55-65%.  . Diabetes mellitus   . Dyslipidemia   . ED (erectile dysfunction)   . HTN (hypertension)   . Obesity   . Smoker    CIGARS    Past Surgical History:  Procedure Laterality Date  . APPENDECTOMY    .  arthroscopic knee surgery    . CORONARY STENT PLACEMENT    . LEFT HEART CATHETERIZATION WITH CORONARY ANGIOGRAM N/A 07/11/2012   Procedure: LEFT HEART CATHETERIZATION WITH CORONARY ANGIOGRAM;  Surgeon: Peter M JoMartiniqueMD;  [facility];  Service: Cardiovascular;  Laterality: N/A;  . right shoulder surgery      Current Medications: Prior to Admission medications   Medication Sig Start Date End Date Taking? Authorizing Provider  aspirin 81 MG EC tablet Take 1 tablet (81 mg total) by mouth daily. 07/12/12   BeTheora GianottiNP  atorvastatin (LIPITOR) 20 MG tablet TAKE 1 TABLET BY MOUTH DAILY. 03/20/19   LaDenita LungMD  Blood Glucose Monitoring Suppl (BLOOD GLUCOSE METER) kit Pt wants a Freestyle meter, lancets and test strips. 03/03/13   LaDenita LungMD  ciprofloxacin (CIPRO) 500 MG tablet Take 1 tablet (500 mg total) by mouth 2 (two) times daily. Patient not taking: Reported on 12/22/2018 09/26/18   LaDenita LungMD  clopidogrel (PLAVIX) 75 MG tablet Take 1 tablet (75 mg total) by mouth daily. Please keep app for further refills. Thank you 04/04/19   CoSherren MochaMD  enalapril (VASOTEC) 2.5 MG tablet Take 1 tablet (2.5 mg total) by mouth daily. 06/27/18   LaDenita LungMD  glucose blood (TRUETEST TEST) test strip 1 each by Other route 2 (two) times daily. Use as instructed (This is for Ture test DX: E11.9) 11/26/15   Denita Lung, MD  Lancets Misc. MISC 1 each by Does not apply route 2 (two) times daily. (This is for ture test DX: E11.9) 07/30/14   Denita Lung, MD  loratadine (CLARITIN) 10 MG tablet Take 10 mg by mouth daily.    [provider]  metFORMIN (GLUCOPHAGE-XR) 750 MG 24 hr tablet Take 1 tablet by mouth once daily with breakfast 04/05/19   Denita Lung, MD  metoprolol succinate (TOPROL-XL) 25 MG 24 hr tablet Take 1 tablet (25 mg total) by mouth daily. 06/27/18   Denita Lung, MD  Multiple Vitamin (MULTIVITAMIN) tablet Take 1 tablet by  mouth daily.      [provider]  nitroGLYCERIN (NITROSTAT) 0.4 MG SL tablet Place 1 tablet (0.4 mg total) under the tongue every 5 (five) minutes x 3 doses as needed for chest pain. 11/01/17   Sherren Mocha, MD  penicillin v potassium (VEETID) 500 MG tablet Take 1 tablet by mouth every 6 (six) hours. 02/03/17   [provider]    Allergies:   Effient [prasugrel], Lactose intolerance (gi), and Saxagliptin-metformin er   Social History   Socioeconomic History  . Marital status: Married    Spouse name: Not on file  . Number of children: Not on file  . Years of education: Not on file  . Highest education level: Not on file  Occupational History  . Occupation: taxi Geophysicist/field seismologist  Tobacco Use  . Smoking status: Light Tobacco Smoker    Types: Cigars  . Smokeless tobacco: Never Used  . Tobacco comment: smoke 1--d for 25 years; quit 6 months ago. is now smoking ciagrs occasionally   Substance and Sexual Activity  . Alcohol use: No    Comment: rare   . Drug use: No  . Sexual activity: Not on file  Other Topics Concern  . Not on file  Social History Narrative   Married; full time.   Social Determinants of Health   Financial Resource Strain:   . Difficulty of Paying Living Expenses:   Food Insecurity:   . Worried About Charity fundraiser in the Last Year:   . Arboriculturist in the Last Year:   Transportation Needs:   . Film/video editor (Medical):   Marland Kitchen Lack of Transportation (Non-Medical):   Physical Activity:   . Days of Exercise per Week:   . Minutes of Exercise per Session:   Stress:   . Feeling of Stress :   Social Connections:   . Frequency of Communication with Friends and Family:   . Frequency of Social Gatherings with Friends and Family:   . Attends Religious Services:   . Active Member of Clubs or Organizations:   . Attends Archivist Meetings:   Marland Kitchen Marital Status:      Family History:  The patient's family history includes Diabetic  kidney disease in his father and mother; Heart attack in his father and mother; Heart failure in his father.   ROS:   Please see the history of present illness.    ROS All other systems reviewed and are negative.   PHYSICAL EXAM:   VS:  BP 120/60   Pulse 88   Ht _0  (1.727 m)   Wt 217 lb 6.4 oz (98.6 kg)   SpO2 97%   BMI  33.06 kg/m    GEN: Well nourished, well developed, in no acute distress  HEENT: normal  Neck: no JVD, carotid bruits, or masses Cardiac: RRR; no murmurs, rubs, or gallops,no edema  Respiratory:  clear to auscultation bilaterally, normal work of breathing GI: soft, nontender, nondistended, + BS MS: no deformity or atrophy  Skin: warm and dry, no rash Neuro:  Alert and Oriented x 3, Strength and sensation are intact Psych: euthymic mood, full affect  Wt Readings from Last 3 Encounters:  04/12/19 217 lb 6.4 oz (98.6 kg)  12/22/18 213 lb 6.4 oz (96.8 kg)  09/22/18 210 lb 6.4 oz (95.4 kg)      Studies/Labs Reviewed:   EKG:  EKG is ordered today.  The ekg ordered today demonstrates normal sinus rhythm at rate of 88 bpm  Recent Labs: No results found for requested labs within last 8760 hours.   Lipid Panel    Component Value Date/Time   CHOL 163 02/22/2018 0900   TRIG 173 (H) 02/22/2018 0900   HDL 41 02/22/2018 0900   CHOLHDL 4.0 02/22/2018 0900   CHOLHDL 4.7 10/02/2015 0913   VLDL 41 (H) 10/02/2015 0913   LDLCALC 87 02/22/2018 0900    Additional studies/ records that were reviewed today include:   Cardiac Catheterization: 07/2012 Coronary angiography: Coronary dominance: right  Left mainstem: The left main has mild irregularities less than 10%.  Left anterior descending (LAD): The left anterior descending artery is moderately calcified in the proximal vessel. In the mid vessel at the site of a previous stent there is a 90% in-stent restenosis. This lesion is at the takeoff of the second diagonal. Further distally there is a second stent with a  99% stenosis within the stent. The first diagonal is without significant disease. There is a 60-70% stenosis at the takeoff of the second diagonal.  Left circumflex (LCx): The left circumflex is a large vessel which gives rise to 2 small marginal branches and then terminates in a large posterior lateral branch. In the mid vessel there is diffuse disease up to 20%. The distal vessel has 30-40% disease.  Right coronary artery (RCA): The right coronary is a codominant vessel. There is segmental 50-60% plaque in the proximal vessel there is 30% disease at the crux.  Left ventriculography: Left ventricular systolic function is normal, LVEF is estimated at 55-65%, there is no significant mitral regurgitation   PCI Note: Following the diagnostic procedure, the decision was made to proceed with PCI of the LAD. Effient 60 mg was given orally. Weight-based bivalirudin was given for anticoagulation. Once a therapeutic ACT was achieved, a 6 [place] XB LAD 3.5 guide catheter was inserted. A A pro-water coronary guidewire was used to cross the lesion. The lesion was predilated with a 2.5 mm balloon. there was still significant waisting of the balloon in the mid vessel. We predilated with a 3.0 mm noncompliant balloon to make sure that this lesion would yield. The lesion was then stented with a 3.0 x 38 mm Promus stent. The stent was postdilated with a 3.25 mm noncompliant balloon. The more proximal margin of the stent prior to the second diagonal was postdilated with a 4.0 mm noncompliant balloon. Following PCI, there was 0% residual stenosis and TIMI-3 flow. There was some plaque shift into the ostium of the second diagonal but there was still excellent TIMI-3 flow in this vessel. The patient was pain free. Final angiography confirmed an excellent result. The patient tolerated the procedure well. There were no  immediate procedural complications. A TR band was used for radial hemostasis. The patient was  transferred to the post catheterization recovery area for further monitoring.  PCI Data: Vessel - LAD/Segment - mid to distal Percent Stenosis (pre) 95% TIMI-flow 2 Stent 3.0 x 38 mm Promus Percent Stenosis (post) 0% TIMI-flow (post) 3  Final Conclusions:  1. Single vessel obstructive coronary disease with in-stent restenosis in the mid and distal LAD. 2. Normal LV function. 3. Successful stenting of the tandem lesions in the mid and distal LAD with a long drug-eluting stent.   Recommendations:  Dual antiplatelet therapy for one year. Aggressive risk factor modification.    ASSESSMENT & PLAN:   1.  Dyspnea on exertion He only has shortness of breath with heavy lifting.  He walks without shortness of breath for the past couple of days.  He attributes this to weight gain recently.  His symptoms different than prior angina when he had stenting.  EKG without acute ischemic changes.  I have encouraged weight loss and continue exercise regimen.  He will give [place] a call if worsening symptoms.  2. CAD - Continue lifelong DAPT.  Continue Lipitor and Toprol-XL.  3. HTN - Stable and well control to current medications.   4. HLD - Has lab work with PCP next month -Continue Lipitor 20 mg    Medication Adjustments/Labs and Tests Ordered: Current medicines are reviewed at length with the patient today.  Concerns regarding medicines are outlined above.  Medication changes, Labs and Tests ordered today are listed in the Patient Instructions below. Patient Instructions  Medication Instructions:  Your physician recommends that you continue on your current medications as directed. Please refer to the Current Medication list given to you today.  *If you need a refill on your cardiac medications before your next appointment, please call your pharmacy*   Lab Work: None orderd  If you have labs (blood work) drawn today and your tests are completely normal, you will receive your  results only by: Marland Kitchen MyChart Message (if you have MyChart) OR . A paper copy in the mail If you have any lab test that is abnormal or we need to change your treatment, we will call you to review the results.   Testing/Procedures: None ordered   Follow-Up: At [facility], you and your health needs are our priority.  As part of our continuing mission to provide you with exceptional heart care, we have created designated Provider Care Teams.  These Care Teams include your primary Cardiologist (physician) and Advanced Practice Providers (APPs -  Physician Assistants and Nurse Practitioners) who all work together to provide you with the care you need, when you need it.  We recommend signing up for the patient portal called "MyChart".  Sign up information is provided on this After Visit Summary.  MyChart is used to connect with patients for Virtual Visits (Telemedicine).  Patients are able to view lab/test results, encounter notes, upcoming appointments, etc.  Non-urgent messages can be sent to your provider as well.   To learn more about what you can do with MyChart, go to [email or web address].    Your next appointment:   12 month(s)  The format for your next appointment:   In Person  Provider:   You may see Sherren Mocha, MD or one of the following Advanced Practice Providers on your designated Care Team:    Richardson Dopp, PA-C  [place], [place]  Daune Perch, NP    Other Instructions  Jarrett Soho, [place]  04/12/2019 3:34 PM    [facility] [address], [place], [place]  [address] Phone: [phone]; Fax: [phone]

## 2019-04-19 ENCOUNTER — Ambulatory Visit: Payer: Self-pay | Attending: Internal Medicine

## 2019-04-19 DIAGNOSIS — Z23 Encounter for immunization: Secondary | ICD-10-CM

## 2019-04-19 NOTE — Progress Notes (Signed)
   Covid-19 Vaccination Clinic  Name:  Jerome Rodriguez    MRN: [id number] DOB: [birth year]  04/19/2019  Jerome Rodriguez was observed post Covid-19 immunization for 15 minutes without incident. He was provided with Vaccine Information Sheet and instruction to access the V-Safe system.   Jerome Rodriguez was instructed to call 911 with any severe reactions post vaccine: Marland Kitchen Difficulty breathing  . Swelling of face and throat  . A fast heartbeat  . A bad rash all over body  . Dizziness and weakness   Immunizations Administered    Name Date Dose VIS Date Route   Pfizer COVID-19 Vaccine 04/19/2019 12:41 PM 0.3 mL 12/16/2018 Intramuscular   Manufacturer: [place]   Lot: [id number]   [place]: [id number]

## 2019-05-02 ENCOUNTER — Other Ambulatory Visit: Payer: Self-pay | Admitting: Cardiovascular Disease

## 2019-05-02 MED FILL — CLOPIDOGREL 75 MG TABLET: 75 | 90 days supply | Qty: 90 | Fill #0

## 2019-05-30 ENCOUNTER — Other Ambulatory Visit: Payer: Self-pay

## 2019-05-30 ENCOUNTER — Ambulatory Visit (INDEPENDENT_AMBULATORY_CARE_PROVIDER_SITE_OTHER): Payer: Medicare Other | Admitting: Family Medicine

## 2019-05-30 ENCOUNTER — Encounter: Payer: Self-pay | Admitting: Family Medicine

## 2019-05-30 VITALS — BP 110/52 | HR 80 | Temp 97.8°F | Ht 67.5 in | Wt 211.6 lb

## 2019-05-30 DIAGNOSIS — Z9119 Patient's noncompliance with other medical treatment and regimen: Secondary | ICD-10-CM

## 2019-05-30 DIAGNOSIS — E118 Type 2 diabetes mellitus with unspecified complications: Secondary | ICD-10-CM | POA: Diagnosis not present

## 2019-05-30 DIAGNOSIS — I2510 Atherosclerotic heart disease of native coronary artery without angina pectoris: Secondary | ICD-10-CM | POA: Diagnosis not present

## 2019-05-30 DIAGNOSIS — Z1211 Encounter for screening for malignant neoplasm of colon: Secondary | ICD-10-CM

## 2019-05-30 DIAGNOSIS — R319 Hematuria, unspecified: Secondary | ICD-10-CM

## 2019-05-30 DIAGNOSIS — E1159 Type 2 diabetes mellitus with other circulatory complications: Secondary | ICD-10-CM

## 2019-05-30 DIAGNOSIS — E1121 Type 2 diabetes mellitus with diabetic nephropathy: Secondary | ICD-10-CM

## 2019-05-30 DIAGNOSIS — E669 Obesity, unspecified: Secondary | ICD-10-CM

## 2019-05-30 DIAGNOSIS — I10 Essential (primary) hypertension: Secondary | ICD-10-CM

## 2019-05-30 DIAGNOSIS — Z136 Encounter for screening for cardiovascular disorders: Secondary | ICD-10-CM

## 2019-05-30 DIAGNOSIS — E785 Hyperlipidemia, unspecified: Secondary | ICD-10-CM

## 2019-05-30 DIAGNOSIS — E1169 Type 2 diabetes mellitus with other specified complication: Secondary | ICD-10-CM

## 2019-05-30 DIAGNOSIS — H9112 Presbycusis, left ear: Secondary | ICD-10-CM

## 2019-05-30 DIAGNOSIS — Z91199 Patient's noncompliance with other medical treatment and regimen due to unspecified reason: Secondary | ICD-10-CM

## 2019-05-30 LAB — POCT URINALYSIS DIP (PROADVANTAGE DEVICE)
Glucose, UA: NEGATIVE mg/dL
Nitrite, UA: POSITIVE — AB
Protein Ur, POC: 100 mg/dL — AB
Specific Gravity, Urine: 1.025
Urobilinogen, Ur: 0.2
pH, UA: 6 (ref 5.0–8.0)

## 2019-05-30 LAB — POCT GLYCOSYLATED HEMOGLOBIN (HGB A1C): Hemoglobin A1C: 11.8 % — AB (ref 4.0–5.6)

## 2019-05-30 MED ORDER — DAPAGLIFLOZIN PROPANEDIOL 5 MG PO TABS: 5.0000 mg | ORAL_TABLET | Freq: Every day | ORAL | 5 refills | Status: DC

## 2019-05-30 NOTE — Progress Notes (Addendum)
Jerome Rodriguez is a 64 y.o. male who presents for a welcome to medicare visit and follow-up on chronic medical conditions.  He did not follow-up on the hematuria that he had stating that no one contacted him concerning this.  He has had at least 2 further episodes of gross hematuria but no abdominal pain, fever, dysuria, urgency or frequency.  He admits to dietary indiscretion in regard to his diabetes and notes several blood sugars in the high 200 range.  Continues on Metformin and takes it fairly regularly.  He is also taking atorvastatin for about aches or pains.  He continues on Claritin for his allergies.  He is also taking enalapril and metoprolol.  He has had no difficulty with chest pain or shortness of breath.  He has not followed up with cardiology in quite some time.  He also continues on Plavix.  He does complain of difficulty hearing from the left ear.  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  His physical activity is minimal.   Immunizations and Health Maintenance Immunization History  Administered Date(s) Administered  . Influenza Split 11/17/2010  . PFIZER SARS-COV-2 Vaccination 03/27/2019, 04/19/2019  . Pneumococcal Conjugate-13 07/30/2014  . Pneumococcal Polysaccharide-23 05/04/2007  . Tdap 05/04/2007  . Zoster 07/30/2014   Health Maintenance Due  Topic Date Due  . Hepatitis C Screening  Never done  . COLONOSCOPY  Never done  . OPHTHALMOLOGY EXAM  02/22/2013  . TETANUS/TDAP  05/03/2017    Last colonoscopy:  02-18-10 endo Last PSA: 10-02-15 Dentist: over one year Ophtho: over two years Exercise: staying active, walking  Other doctors caring for patient include: Bhagat PA cardiology  Advanced Directives: not on file . Info given Does Patient Have a Medical Advance Directive?: No Would patient like information on creating a medical advance directive?: Yes (MAU/Ambulatory/Procedural Areas - Information given)  Depression screen:  See questionnaire  below.     Depression screen [facility] 2/9 05/30/2019 10/22/2017  Decreased Interest 0 0  Down, Depressed, Hopeless 0 0  PHQ - 2 Score 0 0    Fall Screen: See Questionaire below.   Fall Risk  05/30/2019  Falls in the past year? 0    ADL screen:  See questionnaire below.  Functional Status Survey: Is the patient deaf or have difficulty hearing?: No Does the patient have difficulty seeing, even when wearing glasses/contacts?: No Does the patient have difficulty concentrating, remembering, or making decisions?: No Does the patient have difficulty walking or climbing stairs?: No Does the patient have difficulty dressing or bathing?: No Does the patient have difficulty doing errands alone such as visiting a doctor's office or shopping?: No   Review of Systems  Constitutional: -, -unexpected weight change, -anorexia, -fatigue Allergy: -sneezing, -itching, -congestion Dermatology: denies changing moles, rash, lumps ENT: -runny nose, -ear pain, -sore throat,  Cardiology:  -chest pain, -palpitations, -orthopnea, Respiratory: -cough, -shortness of breath, -dyspnea on exertion, -wheezing,  Gastroenterology: -abdominal pain, -nausea, -vomiting, -diarrhea, -constipation, -dysphagia Hematology: -bleeding or bruising problems Musculoskeletal: -arthralgias, -myalgias, -joint swelling, -back pain, - Ophthalmology: -vision changes,  Urology: -dysuria, -difficulty urinating,  -urinary frequency, -urgency, incontinence Neurology: -, -numbness, , -memory loss, -falls, -dizziness    PHYSICAL EXAM:  BP (!) 110/52   Pulse 80   Temp 97.8 F (36.6 C)   Ht 5' 7.5" (1.715 m)   Wt 211 lb 9.6 oz (96 kg)   SpO2 97%   BMI 32.65 kg/m   General Appearance: Alert, cooperative, no distress, appears  stated age Head: Normocephalic, without obvious abnormality, atraumatic Eyes: PERRL, conjunctiva/corneas clear, EOM's intact,  Ears: Normal TM's and external ear canals Nose: Nares normal, mucosa normal, no  drainage or sinus   tenderness Throat: Lips, mucosa, and tongue normal; teeth and gums normal Neck: Supple, no lymphadenopathy, thyroid:no enlargement/tenderness/nodules; no carotid bruit or JVD Lungs: Clear to auscultation bilaterally without wheezes, rales or ronchi; respirations unlabored Heart: Regular rate and rhythm, S1 and S2 normal, no murmur, rub or gallop Abdomen: Soft, non-tender, nondistended, normoactive bowel sounds, no masses, no hepatosplenomegaly Extremities: No clubbing, cyanosis or edema.  Diabetic foot exam is recorded and is normal Pulses: 2+ and symmetric all extremities Skin: Skin color, texture, turgor normal, no rashes or lesions Lymph nodes: Cervical, supraclavicular, and axillary nodes normal Neurologic: CNII-XII intact, normal strength, sensation and gait; reflexes 2+ and symmetric throughout   Psych: Normal mood, affect, hygiene and grooming Hemoglobin A1c is 11.   ASSESSMENT/PLAN: Hematuria, unspecified type - Plan: Ambulatory referral to Urology  Type 2 diabetes with complication ([place]) - Plan: POCT Urinalysis DIP (Proadvantage Device), Lipid panel, Comprehensive metabolic panel, CBC with Differential/Platelet, POCT glycosylated hemoglobin (Hb A1C), Ambulatory referral to Ophthalmology, dapagliflozin propanediol (FARXIGA) 5 MG TABS tablet, Microalbumin/Creatinine Ratio, Urine, CANCELED: POCT UA - Microalbumin  Hypertension associated with diabetes ([place]) - Plan: Comprehensive metabolic panel, CBC with Differential/Platelet  Obesity (BMI 30-39.9)  Atherosclerosis of native coronary artery of native heart without angina pectoris  Hyperlipidemia associated with type 2 diabetes mellitus ([place]) - Plan: Lipid panel  Screening for colon cancer - Plan: Cologuard  Screening for AAA (abdominal aortic aneurysm) - Plan: US AORTA MEDICARE SCREENING  Presbycusis of left ear, unspecified hearing status on contralateral side - Plan: Ambulatory referral to  Audiology  Personal history of noncompliance with medical treatment, presenting hazards to health  Diabetic nephropathy with proteinuria ([place]) I agai died.  N reinforced the need for checking his blood sugars before or 2 hours after meals.  Sometimes he is checking in 3 hours.  Stressed the fact that he needs to keep all blood sugars below 180.  Talked about the new medication and the potential cost of this.  He is to not get it filled until he gets his Medicare supplement enforced.  He will keep me informed concerning this.  Also strongly encouraged him to follow-up on the hematuria and not let this  recommended at least 30 minutes of aerobic activity at least 5 days/week;  healthy diet    Immunization recommendations discussed.  Recommend he get Tdap and Shingrix at the drugstore.  Colonoscopy recommendations reviewed.   Medicare Attestation I have personally reviewed: The patient's medical and social history Their use of alcohol, tobacco or illicit drugs Their current medications and supplements The patient's functional ability including ADLs,fall risks, home safety risks, cognitive, and hearing and visual impairment Diet and physical activities Evidence for depression or mood disorders  The patient's weight, height, and BMI have been recorded in the chart.  I have made referrals, counseling, and provided education to the patient based on review of the above and I have provided the patient with a written personalized care plan for preventive services.     Jill Alexanders, MD   05/31/2019

## 2019-05-31 ENCOUNTER — Telehealth: Payer: Self-pay

## 2019-05-31 ENCOUNTER — Other Ambulatory Visit: Payer: Self-pay

## 2019-05-31 DIAGNOSIS — E1121 Type 2 diabetes mellitus with diabetic nephropathy: Secondary | ICD-10-CM | POA: Insufficient documentation

## 2019-05-31 DIAGNOSIS — Z136 Encounter for screening for cardiovascular disorders: Secondary | ICD-10-CM

## 2019-05-31 LAB — MICROALBUMIN / CREATININE URINE RATIO
Creatinine, Urine: 171.3 mg/dL
Microalb/Creat Ratio: 219 mg/g creat — ABNORMAL HIGH (ref 0–29)
Microalbumin, Urine: 375.4 ug/mL

## 2019-05-31 LAB — COMPREHENSIVE METABOLIC PANEL
ALT: 13 IU/L (ref 0–44)
AST: 18 IU/L (ref 0–40)
Albumin/Globulin Ratio: 2.2 (ref 1.2–2.2)
Albumin: 4.4 g/dL (ref 3.8–4.8)
Alkaline Phosphatase: 136 IU/L — ABNORMAL HIGH (ref 48–121)
BUN/Creatinine Ratio: 14 (ref 10–24)
BUN: 16 mg/dL (ref 8–27)
Bilirubin Total: 0.6 mg/dL (ref 0.0–1.2)
CO2: 22 mmol/L (ref 20–29)
Calcium: 9.3 mg/dL (ref 8.6–10.2)
Chloride: 101 mmol/L (ref 96–106)
Creatinine, Ser: 1.11 mg/dL (ref 0.76–1.27)
GFR calc Af Amer: 80 mL/min/{1.73_m2} (ref >59)
GFR calc non Af Amer: 69 mL/min/{1.73_m2} (ref >59)
Globulin, Total: 2 g/dL (ref 1.5–4.5)
Glucose: 221 mg/dL — ABNORMAL HIGH (ref 65–99)
Potassium: 4.9 mmol/L (ref 3.5–5.2)
Sodium: 139 mmol/L (ref 134–144)
Total Protein: 6.4 g/dL (ref 6.0–8.5)

## 2019-05-31 LAB — LIPID PANEL
Chol/HDL Ratio: 3.2 ratio (ref 0.0–5.0)
Cholesterol, Total: 126 mg/dL (ref 100–199)
HDL: 39 mg/dL — ABNORMAL LOW (ref >39)
LDL Chol Calc (NIH): 67 mg/dL (ref 0–99)
Triglycerides: 105 mg/dL (ref 0–149)
VLDL Cholesterol Cal: 20 mg/dL (ref 5–40)

## 2019-05-31 LAB — CBC WITH DIFFERENTIAL/PLATELET
Basophils Absolute: 0.1 10*3/uL (ref 0.0–0.2)
Basos: 1 %
EOS (ABSOLUTE): 0.2 10*3/uL (ref 0.0–0.4)
Eos: 2 %
Hematocrit: 44.1 % (ref 37.5–51.0)
Hemoglobin: 14.6 g/dL (ref 13.0–17.7)
Immature Grans (Abs): 0 10*3/uL (ref 0.0–0.1)
Immature Granulocytes: 0 %
Lymphocytes Absolute: 1.7 10*3/uL (ref 0.7–3.1)
Lymphs: 22 %
MCH: 29.1 pg (ref 26.6–33.0)
MCHC: 33.1 g/dL (ref 31.5–35.7)
MCV: 88 fL (ref 79–97)
Monocytes Absolute: 0.6 10*3/uL (ref 0.1–0.9)
Monocytes: 8 %
Neutrophils Absolute: 5.4 10*3/uL (ref 1.4–7.0)
Neutrophils: 67 %
Platelets: 244 10*3/uL (ref 150–450)
RBC: 5.01 x10E6/uL (ref 4.14–5.80)
RDW: 13 % (ref 11.6–15.4)
WBC: 8.1 10*3/uL (ref 3.4–10.8)

## 2019-05-31 NOTE — Telephone Encounter (Signed)
Appt was made. KH 

## 2019-05-31 NOTE — Telephone Encounter (Signed)
Pt advised that his let shoulder is causing a lot  Of pain. He would like a referral to be sent over to [facility]. Please advise . Jerome Rodriguez

## 2019-05-31 NOTE — Telephone Encounter (Signed)
Have him see me first so I can see what might need to be done

## 2019-05-31 NOTE — Telephone Encounter (Signed)
Pt. Called wanted me to leave you a message to call him back regarding his [place] at [place] on 06/09/19, he said he rather have this done at [facility] if you could please call him back.

## 2019-06-02 NOTE — Telephone Encounter (Signed)
Appt made Kh 

## 2019-06-08 ENCOUNTER — Encounter: Payer: Self-pay | Admitting: Urology

## 2019-06-08 ENCOUNTER — Other Ambulatory Visit: Payer: Self-pay

## 2019-06-08 ENCOUNTER — Ambulatory Visit (INDEPENDENT_AMBULATORY_CARE_PROVIDER_SITE_OTHER): Payer: Medicare HMO | Admitting: Urology

## 2019-06-08 VITALS — BP 116/70 | HR 86 | Ht 68.0 in | Wt 210.0 lb

## 2019-06-08 DIAGNOSIS — R3121 Asymptomatic microscopic hematuria: Secondary | ICD-10-CM

## 2019-06-08 DIAGNOSIS — R310 Gross hematuria: Secondary | ICD-10-CM | POA: Diagnosis not present

## 2019-06-08 NOTE — Progress Notes (Signed)
06/08/19 1:49 PM   Jidenna Berkemeier Al-Rammal [birth year] [id number]  CC: Microscopic hematuria  HPI: I saw [name] in urology clinic today for microscopic hematuria.  He is a 64 year old male with history notable for type 2 diabetes and CAD with stents on Plavix he was noted to have moderate RBCs on microscopic analysis on multiple urine samples with PCP.  He reports he has had some dark urine but denies any frank blood or red urine.  He denies any urinary symptoms.  He has had 15 pounds of weight loss over the last 6 weeks, but he has been exercising more than usual.  Past surgical history is notable for appendectomy.  He has smoked 2 to 3 cigars a day for the last 40 years.  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  He denies any history of UTI or urinary retention.  There is no prior cross-sectional imaging to review.  He denies any history of kidney stones.  He denies any flank or abdominal pain.  Urinalysis today notable for greater than 30 RBCs, 0-5 WBCs, no bacteria, nitrite negative.   PMH: Past Medical History:  Diagnosis Date  . Abnormal chest CT    a. 07/2012: scattered bilat noncalcified pulm nodules ranging in size from a few mm to a max of 38mm in LLL - rec f/u in 3-6 mos.  Marland Kitchen CAD (coronary artery disease)    a. NSTEMI 10/04 => LHC: mLAD 99%=> Taxus DES to mLAD and Taxus DES to dLAD;  b. ETT-Myoview 11/2011: normal, EF 73%, no ischemia; c. 07/2012 Cath/PCI: LM <10, LAD 90 ISRp/99 ISRd (3.0x38 Promus DES), LCX 68m, 30-40d, RCA 50-60p, 30d, EF 55-65%.  . Diabetes mellitus   . Dyslipidemia   . ED (erectile dysfunction)   . HTN (hypertension)   . Obesity   . Smoker    CIGARS    Surgical History: Past Surgical History:  Procedure Laterality Date  . APPENDECTOMY    . arthroscopic knee surgery    . CORONARY STENT PLACEMENT    . LEFT HEART CATHETERIZATION WITH CORONARY ANGIOGRAM N/A 07/11/2012   Procedure: LEFT HEART CATHETERIZATION WITH  CORONARY ANGIOGRAM;  Surgeon: Peter M Martinique, MD;  Location: [facility] CATH LAB;  Service: Cardiovascular;  Laterality: N/A;  . right shoulder surgery     Family History: Family History  Problem Relation Age of Onset  . Heart attack Mother        died in sleep  . Diabetic kidney disease Mother   . Diabetic kidney disease Father   . Heart attack Father   . Heart failure Father     Social History:  reports that he has been smoking cigars. He has never used smokeless tobacco. He reports that he does not drink alcohol or use drugs.  Physical Exam: BP 116/70   Pulse 86   Ht 5\' 8"  (1.727 m)   Wt 210 lb (95.3 kg)   BMI 31.93 kg/m    Constitutional:  Alert and oriented, No acute distress. Cardiovascular: No clubbing, cyanosis, or edema. Respiratory: Normal respiratory effort, no increased work of breathing. GI: Abdomen is soft, nontender, nondistended, no abdominal masses GU: No CVA tenderness, phallus with widely patent meatus  Laboratory Data: Reviewed, see HPI  Pertinent Imaging: None to review  Assessment & Plan:   He is a 64 year old male with persistent high-grade microscopic hematuria with > 30 RBCs on urinalysis today and no evidence of infection.  We discussed common  possible etiologies of microscopic hematuria including idiopathic, urolithiasis, medical renal disease, and malignancy. We discussed the new asymptomatic microscopic hematuria guidelines and risk categories of low, intermediate, and high risk that are based on age, risk factors like smoking, and degree of microscopic hematuria. We discussed work-up can range from repeat urinalysis, renal ultrasound and cystoscopy, to CT urogram and cystoscopy.  They fall into the high risk category, and I recommended proceeding with CT urogram and cystoscopy.    [name], MD 06/08/2019  [facility] [address], [place], [place] [address] [phone]

## 2019-06-08 NOTE — Patient Instructions (Signed)
Cystoscopy Cystoscopy is a procedure that is used to help diagnose and sometimes treat conditions that affect the lower urinary tract. The lower urinary tract includes the bladder and the urethra. The urethra is the tube that drains urine from the bladder. Cystoscopy is done using a thin, tube-shaped instrument with a light and camera at the end (cystoscope). The cystoscope may be hard or flexible, depending on the goal of the procedure. The cystoscope is inserted through the urethra, into the bladder. Cystoscopy may be recommended if you have:  Urinary tract infections that keep coming back.  Blood in the urine (hematuria).  An inability to control when you urinate (urinary incontinence) or an overactive bladder.  Unusual cells found in a urine sample.  A blockage in the urethra, such as a urinary stone.  Painful urination.  An abnormality in the bladder found during an intravenous pyelogram (IVP) or CT scan. Cystoscopy may also be done to remove a sample of tissue to be examined under a microscope (biopsy). What are the risks? Generally, this is a safe procedure. However, problems may occur, including:  Infection.  Bleeding.  What happens during the procedure?  1. You will be given one or more of the following: ? A medicine to numb the area (local anesthetic). 2. The area around the opening of your urethra will be cleaned. 3. The cystoscope will be passed through your urethra into your bladder. 4. Germ-free (sterile) fluid will flow through the cystoscope to fill your bladder. The fluid will stretch your bladder so that your health care provider can clearly examine your bladder walls. 5. Your doctor will look at the urethra and bladder. 6. The cystoscope will be removed The procedure may vary among health care providers  What can I expect after the procedure? After the procedure, it is common to have: 1. Some soreness or pain in your abdomen and urethra. 2. Urinary symptoms.  These include: ? Mild pain or burning when you urinate. Pain should stop within a few minutes after you urinate. This may last for up to 1 week. ? A small amount of blood in your urine for several days. ? Feeling like you need to urinate but producing only a small amount of urine. Follow these instructions at home: General instructions  Return to your normal activities as told by your health care provider.   Do not drive for 24 hours if you were given a sedative during your procedure.  Watch for any blood in your urine. If the amount of blood in your urine increases, call your health care provider.  If a tissue sample was removed for testing (biopsy) during your procedure, it is up to you to get your test results. Ask your health care provider, or the department that is doing the test, when your results will be ready.  Drink enough fluid to keep your urine pale yellow.  Keep all follow-up visits as told by your health care provider. This is important. Contact a health care provider if you:  Have pain that gets worse or does not get better with medicine, especially pain when you urinate.  Have trouble urinating.  Have more blood in your urine. Get help right away if you:  Have blood clots in your urine.  Have abdominal pain.  Have a fever or chills.  Are unable to urinate. Summary  Cystoscopy is a procedure that is used to help diagnose and sometimes treat conditions that affect the lower urinary tract.  Cystoscopy is done using   a thin, tube-shaped instrument with a light and camera at the end.  After the procedure, it is common to have some soreness or pain in your abdomen and urethra.  Watch for any blood in your urine. If the amount of blood in your urine increases, call your health care provider.  If you were prescribed an antibiotic medicine, take it as told by your health care provider. Do not stop taking the antibiotic even if you start to feel better. This  information is not intended to replace advice given to you by your health care provider. Make sure you discuss any questions you have with your health care provider. Document Revised: 12/14/2017 Document Reviewed: 12/14/2017 Elsevier Patient Education  2020 Elsevier Inc.   

## 2019-06-09 ENCOUNTER — Ambulatory Visit (INDEPENDENT_AMBULATORY_CARE_PROVIDER_SITE_OTHER): Payer: Medicare HMO | Admitting: Family Medicine

## 2019-06-09 ENCOUNTER — Ambulatory Visit: Payer: Self-pay

## 2019-06-09 ENCOUNTER — Encounter: Payer: Self-pay | Admitting: Family Medicine

## 2019-06-09 VITALS — BP 108/68 | HR 78 | Temp 97.5°F | Wt 209.4 lb

## 2019-06-09 DIAGNOSIS — M7522 Bicipital tendinitis, left shoulder: Secondary | ICD-10-CM

## 2019-06-09 LAB — MICROSCOPIC EXAMINATION
Bacteria, UA: NONE SEEN
RBC, Urine: >30 /hpf — AB (ref 0–2)

## 2019-06-09 LAB — URINALYSIS, COMPLETE
Bilirubin, UA: NEGATIVE
Glucose, UA: NEGATIVE
Ketones, UA: NEGATIVE
Leukocytes,UA: NEGATIVE
Nitrite, UA: NEGATIVE
Protein,UA: NEGATIVE
Specific Gravity, UA: 1.025 (ref 1.005–1.030)
Urobilinogen, Ur: 0.2 mg/dL (ref 0.2–1.0)
pH, UA: 5 (ref 5.0–7.5)

## 2019-06-09 MED FILL — FARXIGA 5 MG TABLET: 5 | 30 days supply | Qty: 30 | Fill #0

## 2019-06-09 NOTE — Patient Instructions (Signed)
Heat for 20 minutes 3 times per day.  Avoid any activity that makes it worse.  Do this for at least 2 weeks if not longer to let it heal.  You can take Tylenol on a regular basis to help as well

## 2019-06-09 NOTE — Progress Notes (Signed)
   Subjective:    Patient ID: Jerome Rodriguez, male    DOB: May 05, 1955, 64 y.o.   MRN: [id number]  HPI He states that roughly 9 months agohe noted while lifting a suitcase with his left arm in an abducted and externally rotated manner he had pain and tenderness to palpation to the anterior shoulder.  Since then his continue to bother him especially with any motion.  He says he feels a clicking sensation.   Review of Systems     Objective:   Physical Exam Exam of the left shoulder shows a slight clicking sensation in the area of the bicipital groove with some tenderness palpation in that area.  No shoulder joint laxity.  Negative drop arm test.  Neer's and Hawkins test was normal.       Assessment & Plan:  Bicipital tendinitis of left shoulder I explained that I thought this was a bicipital tendon issue and possible subluxation.  Recommend conservative care with heat, relative rest of that area and anti-inflammatory.  If he continues have difficulty I will refer to orthopedics.  He was comfortable with that.

## 2019-06-12 ENCOUNTER — Telehealth: Payer: Self-pay

## 2019-06-12 NOTE — Telephone Encounter (Signed)
Called pt to advise that we need him to call his insurance company to find out what med they prefer to cover instead of farxiga. No answer and LVM. [place]

## 2019-06-14 ENCOUNTER — Other Ambulatory Visit: Payer: Self-pay | Admitting: Urology

## 2019-06-15 ENCOUNTER — Telehealth: Payer: Self-pay

## 2019-06-15 NOTE — Telephone Encounter (Signed)
2 nd try to find out what med will be covered in the place of his farxiga. Pt advised he would call and check with his insurance at his last ov. [place]

## 2019-06-19 ENCOUNTER — Other Ambulatory Visit: Payer: Self-pay | Admitting: Family Medicine

## 2019-06-19 DIAGNOSIS — E1169 Type 2 diabetes mellitus with other specified complication: Secondary | ICD-10-CM

## 2019-06-19 MED FILL — ATORVASTATIN 20 MG TABLET: 20 | 90 days supply | Qty: 90 | Fill #0

## 2019-06-20 DIAGNOSIS — H2513 Age-related nuclear cataract, bilateral: Secondary | ICD-10-CM | POA: Diagnosis not present

## 2019-06-20 DIAGNOSIS — H40013 Open angle with borderline findings, low risk, bilateral: Secondary | ICD-10-CM | POA: Diagnosis not present

## 2019-06-20 DIAGNOSIS — E119 Type 2 diabetes mellitus without complications: Secondary | ICD-10-CM | POA: Diagnosis not present

## 2019-06-20 DIAGNOSIS — H0288A Meibomian gland dysfunction right eye, upper and lower eyelids: Secondary | ICD-10-CM | POA: Diagnosis not present

## 2019-06-20 DIAGNOSIS — H40053 Ocular hypertension, bilateral: Secondary | ICD-10-CM | POA: Diagnosis not present

## 2019-06-20 DIAGNOSIS — H1045 Other chronic allergic conjunctivitis: Secondary | ICD-10-CM | POA: Diagnosis not present

## 2019-06-20 DIAGNOSIS — H0288B Meibomian gland dysfunction left eye, upper and lower eyelids: Secondary | ICD-10-CM | POA: Diagnosis not present

## 2019-06-21 ENCOUNTER — Other Ambulatory Visit: Payer: Self-pay

## 2019-06-21 ENCOUNTER — Ambulatory Visit: Payer: Medicare HMO | Attending: Family Medicine | Admitting: Audiologist

## 2019-06-21 ENCOUNTER — Telehealth: Payer: Self-pay

## 2019-06-21 DIAGNOSIS — H9312 Tinnitus, left ear: Secondary | ICD-10-CM | POA: Diagnosis not present

## 2019-06-21 DIAGNOSIS — H9112 Presbycusis, left ear: Secondary | ICD-10-CM

## 2019-06-21 DIAGNOSIS — H90A32 Mixed conductive and sensorineural hearing loss, unilateral, left ear with restricted hearing on the contralateral side: Secondary | ICD-10-CM | POA: Insufficient documentation

## 2019-06-21 DIAGNOSIS — H90A21 Sensorineural hearing loss, unilateral, right ear, with restricted hearing on the contralateral side: Secondary | ICD-10-CM | POA: Diagnosis not present

## 2019-06-21 DIAGNOSIS — H918X2 Other specified hearing loss, left ear: Secondary | ICD-10-CM | POA: Diagnosis not present

## 2019-06-21 DIAGNOSIS — Z0100 Encounter for examination of eyes and vision without abnormal findings: Secondary | ICD-10-CM | POA: Diagnosis not present

## 2019-06-21 DIAGNOSIS — IMO0001 Reserved for inherently not codable concepts without codable children: Secondary | ICD-10-CM

## 2019-06-21 NOTE — Progress Notes (Signed)
mbu

## 2019-06-21 NOTE — Progress Notes (Signed)
Make the referral 

## 2019-06-21 NOTE — Telephone Encounter (Signed)
Please advise if you send in abx for dental work and referral was put in for ENT. [place]

## 2019-06-21 NOTE — Telephone Encounter (Signed)
Pt. Walked into the office wanting to know if you could call in an anti biotic for him he had some dental work done and you called him in some in the past when he had dental work done. He also mentioned that he saw Dr. Judson Roch an Audiologists you guys had referred him to and thye are sending the notes here from his visit and they also recommended a referral to ENT and recommended him seeing Dr. Collene Mares.

## 2019-06-21 NOTE — Procedures (Signed)
  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 Jerome Rodriguez hears a rattling sound (like 'stuffed metal balls in a bag') when someone talks in his left ear. This difficulty began suddenly about a year ago. No pain or pressure reported in either ear. Tinnitus present in left ear only. He fell and hit his head back in 2013 after receiving his second shunt surgery, but this was many years before the hearing loss and tinnitus began.  Jerome Rodriguez denies any history of noise exposure. Medical history positive for diabetes and arteriosclerotic heart disease which are risk factor for hearing loss. No other relevant case history reported.   Evaluation:   Otoscopy showed a clear view of the tympanic membranes with visible cone of light, bilaterally  Tympanometry results were consistent with normal middle ear function, bilaterally  Audiometric testing was completed using conventional audiometry with insert transducer, cross checked with headphones. Speech Reception Thresholds were consistent with pure tone averages. Word Recognition was excellent at conversation level in the right ear, and poor at an elevated level with masking in the left ear. Pure tone thresholds show sensorinerual hearing loss in the right ear, and mixed moderately severe hearing loss in the  left ear. Significant asymmetry present with left ear worse. Test results are consistent with hearing loss due to medical pathology, immediate referral necessary.   Results:  The test results were reviewed with [facility] He was counseled on the nature and degree of his hearing loss and the need to see an Ear Nose and Throat Physician specialist to determine the cause of this significant difference in hearing between his ears. This is not an age related, or noise exposure, hearing loss. Patient provided with a copy of his hearing test.   Recommendations: 1. Immediate referral to an Ear Nose and Throat Physician is needed due to unilateral tinnitus and significant mixed loss with asymmetry.  2. Please send referral to: Harrell Gave E. Lucia Gaskins MD, an ENT in the [place]. Shreyan requested he be referred within [facility]. Send audiogram with referral.   Alfonse Alpers  Audiologist, Au.D., CCC-A 06/21/2019  11:14 AM  Cc: Denita Lung, MD

## 2019-06-22 ENCOUNTER — Ambulatory Visit (HOSPITAL_COMMUNITY)
Admission: RE | Admit: 2019-06-22 | Discharge: 2019-06-22 | Disposition: A | Discharge status: Home / Self Care | Payer: Medicare HMO | Source: Ambulatory Visit | Attending: Cardiology | Admitting: Cardiology

## 2019-06-22 DIAGNOSIS — I2510 Atherosclerotic heart disease of native coronary artery without angina pectoris: Secondary | ICD-10-CM | POA: Diagnosis not present

## 2019-06-22 DIAGNOSIS — I10 Essential (primary) hypertension: Secondary | ICD-10-CM | POA: Insufficient documentation

## 2019-06-22 DIAGNOSIS — Z136 Encounter for screening for cardiovascular disorders: Secondary | ICD-10-CM | POA: Insufficient documentation

## 2019-06-22 DIAGNOSIS — I700 Atherosclerosis of aorta: Secondary | ICD-10-CM | POA: Diagnosis not present

## 2019-06-22 DIAGNOSIS — E1121 Type 2 diabetes mellitus with diabetic nephropathy: Secondary | ICD-10-CM | POA: Diagnosis not present

## 2019-06-22 DIAGNOSIS — E785 Hyperlipidemia, unspecified: Secondary | ICD-10-CM | POA: Diagnosis not present

## 2019-06-22 MED FILL — CHLORHEXIDINE 0.12% RINSE: 0.12 | 16 days supply | Qty: 473 | Fill #0

## 2019-06-22 MED FILL — CLINDAMYCIN HCL 150 MG CAPS: 150 | 7 days supply | Qty: 21 | Fill #0

## 2019-06-22 NOTE — Telephone Encounter (Signed)
Pt advised his old dentist sent in abx for him [facility]

## 2019-06-22 NOTE — Telephone Encounter (Signed)
Spoke to heart care and he is having some swelling in his gums and the cardiologist stated he needs a abx but they do not write them. Please advise [facility]

## 2019-06-22 NOTE — Telephone Encounter (Signed)
He should not need an antibiotic prior to dental work.  I do not remember giving him any antibiotic.  He does not meet the criteria for an antibiotic.  Not sure what the doctor Collene Mares it is about.  Check with [place]a Skye on that

## 2019-06-22 NOTE — Telephone Encounter (Signed)
He needs to see his dentist

## 2019-06-23 ENCOUNTER — Telehealth: Payer: Medicare HMO | Admitting: Family Medicine

## 2019-06-30 ENCOUNTER — Ambulatory Visit
Admission: RE | Admit: 2019-06-30 | Discharge: 2019-06-30 | Disposition: A | Discharge status: Home / Self Care | Payer: Medicare HMO | Source: Ambulatory Visit | Attending: Urology | Admitting: Urology

## 2019-06-30 ENCOUNTER — Other Ambulatory Visit: Payer: Self-pay

## 2019-06-30 DIAGNOSIS — R310 Gross hematuria: Secondary | ICD-10-CM | POA: Insufficient documentation

## 2019-06-30 DIAGNOSIS — N200 Calculus of kidney: Secondary | ICD-10-CM | POA: Diagnosis not present

## 2019-06-30 MED ORDER — IOHEXOL 300 MG/ML  SOLN
125.0000 mL | Freq: Once | INTRAMUSCULAR | Status: AC | PRN
  Administered 2019-06-30: 125 mL via INTRAVENOUS

## 2019-07-03 ENCOUNTER — Other Ambulatory Visit: Payer: Self-pay | Admitting: Family Medicine

## 2019-07-03 DIAGNOSIS — E1159 Type 2 diabetes mellitus with other circulatory complications: Secondary | ICD-10-CM

## 2019-07-03 DIAGNOSIS — I2510 Atherosclerotic heart disease of native coronary artery without angina pectoris: Secondary | ICD-10-CM

## 2019-07-03 MED FILL — METOPROLOL SUCCINATE ER 25: 25 | 90 days supply | Qty: 90 | Fill #0

## 2019-07-03 MED FILL — ENALAPRIL MALEATE 2.5 MG TA: 2.5 | 90 days supply | Qty: 90 | Fill #0

## 2019-07-05 ENCOUNTER — Encounter: Payer: Self-pay | Admitting: Urology

## 2019-07-05 ENCOUNTER — Ambulatory Visit (INDEPENDENT_AMBULATORY_CARE_PROVIDER_SITE_OTHER): Payer: Medicare HMO | Admitting: Urology

## 2019-07-05 ENCOUNTER — Other Ambulatory Visit: Payer: Self-pay

## 2019-07-05 VITALS — BP 113/67 | HR 90 | Ht 67.0 in | Wt 204.0 lb

## 2019-07-05 DIAGNOSIS — N200 Calculus of kidney: Secondary | ICD-10-CM

## 2019-07-05 DIAGNOSIS — R310 Gross hematuria: Secondary | ICD-10-CM | POA: Diagnosis not present

## 2019-07-05 NOTE — Progress Notes (Signed)
Cystoscopy Procedure Note:  Indication: Microscopic hematuria  After informed consent and discussion of the procedure and its risks, Jerome Rodriguez was positioned and prepped in the standard fashion. Cystoscopy was performed with a flexible cystoscope. The urethra, bladder neck and entire bladder was visualized in a standard fashion. The prostate was moderate in size. The ureteral orifices were visualized in their normal location and orientation.  No abnormalities on retroflexion.  Imaging: I personally reviewed the CT urogram dated 06/30/2019.  There is a 1.2 cm left lower pole stone, 1400HU. No hydronephrosis.  Findings: No abnormalities on cystoscopy, CT with non-obstructive 1.2 cm left lower pole stone ------------------------------------------------------------------------------------  Assessment and Plan: We had a long conversation about treatment options for his non-obstructive large 1.2 cm left lower pole stone that extends into the renal pelvis.  We discussed active surveillance with KUB every 6 to 12 months and possibility of the stone moving and causing renal colic.  We discussed the stone be too large to pass spontaneously.  We discussed treatment including ureteroscopy and laser lithotripsy with need for stent placement. We specifically discussed the risks ureteroscopy including bleeding, infection/sepsis, stent related symptoms including flank pain/urgency/frequency/incontinence/dysuria, ureteral injury, inability to access stone, or need for staged or additional procedures.  He would not be a candidate for shockwave lithotripsy secondary to the size, location, density of the stone.  He would like to pursue active treatment of his large stone with ureteroscopy.  We will schedule left ureteroscopy, laser lithotripsy, stent placement   [name], MD 07/05/2019

## 2019-07-05 NOTE — Patient Instructions (Signed)
Ureteral Stent Implantation  Ureteral stent implantation is a procedure to insert (implant) a flexible, soft, plastic tube (stent) into a ureter. Ureters are the tube-like parts of the body that drain urine from the kidneys. The stent supports the ureter while it heals and helps to drain urine. You may have a ureteral stent implanted after having a procedure to remove a blockage from the ureter (ureterolysis or pyeloplasty). You may also have a stent implanted to open the flow of urine when you have a blockage caused by a kidney stone, tumor, blood clot, or infection. You have two ureters, one on each side of the body. The ureters connect the kidneys to the organ that holds urine until it passes out of the body (bladder). The stent is placed so that one end is in the kidney, and one end is in the bladder. The stent is usually taken out after your ureter has healed. Depending on your condition, you may have a stent for just a few weeks, or you may have a long-term stent that will need to be replaced every few months. Tell a health care provider about:  Any allergies you have.  All medicines you are taking, including vitamins, herbs, eye drops, creams, and over-the-counter medicines.  Any problems you or family members have had with anesthetic medicines.  Any blood disorders you have.  Any surgeries you have had.  Any medical conditions you have.  Whether you are pregnant or may be pregnant. What are the risks? Generally, this is a safe procedure. However, problems may occur, including:  Infection.  Bleeding.  Allergic reactions to medicines.  Damage to other structures or organs. Tearing (perforation) of the ureter is possible.  Movement of the stent away from where it is placed during surgery (migration). What happens before the procedure? Medicines Ask your health care provider about:  Changing or stopping your regular medicines. This is especially important if you are taking  diabetes medicines or blood thinners.  Taking medicines such as aspirin and ibuprofen. These medicines can thin your blood. Do not take these medicines unless your health care provider tells you to take them.  Taking over-the-counter medicines, vitamins, herbs, and supplements. Eating and drinking Follow instructions from your health care provider about eating and drinking, which may include:  8 hours before the procedure - stop eating heavy meals or foods, such as meat, fried foods, or fatty foods.  6 hours before the procedure - stop eating light meals or foods, such as toast or cereal.  6 hours before the procedure - stop drinking milk or drinks that contain milk.  2 hours before the procedure - stop drinking clear liquids. Staying hydrated Follow instructions from your health care provider about hydration, which may include:  Up to 2 hours before the procedure - you may continue to drink clear liquids, such as water, clear fruit juice, black coffee, and plain tea. General instructions  Do not drink alcohol.  Do not use any products that contain nicotine or tobacco for at least 4 weeks before the procedure. These products include cigarettes, e-cigarettes, and chewing tobacco. If you need help quitting, ask your health care provider.  You may have an exam or testing, such as imaging or blood tests.  Ask your health care provider what steps will be taken to help prevent infection. These may include: ? Removing hair at the surgery site. ? Washing skin with a germ-killing soap. ? Taking antibiotic medicine.  Plan to have someone take you home   from the hospital or clinic.  If you will be going home right after the procedure, plan to have someone with you for 24 hours. What happens during the procedure?  An IV will be inserted into one of your veins.  You may be given a medicine to help you relax (sedative).  You may be given a medicine to make you fall asleep (general  anesthetic).  A thin, tube-shaped instrument with a light and tiny camera at the end (cystoscope) will be inserted into your urethra. The urethra is the tube that drains urine from the bladder out of the body. In men, the urethra opens at the end of the penis. In women, the urethra opens in front of the vaginal opening.  The cystoscope will be passed into your bladder.  A thin wire (guide wire) will be passed through your bladder and into your ureter. This is used to guide the stent into your ureter.  The stent will be inserted into your ureter.  The guide wire and the cystoscope will be removed.  A flexible tube (catheter) may be inserted through your urethra so that one end is in your bladder. This helps to drain urine from your bladder. The procedure may vary among hospitals and health care providers. What happens after the procedure?  Your blood pressure, heart rate, breathing rate, and blood oxygen level will be monitored until you leave the hospital or clinic.  You may continue to receive medicine and fluids through an IV.  You may have some soreness or pain in your abdomen and urethra. Medicines will be available to help you.  You will be encouraged to get up and walk around as soon as you can.  You may have a catheter draining your urine.  You will have some blood in your urine.  Do not drive for 24 hours if you were given a sedative during your procedure. Summary  Ureteral stent implantation is a procedure to insert a flexible, soft, plastic tube (stent) into a ureter.  You may have a stent implanted to support the ureter while it heals after a procedure or to open the flow of urine if there is a blockage.  Follow instructions from your health care provider about taking medicines and about eating and drinking before the procedure.  Depending on your condition, you may have a stent for just a few weeks, or you may have a long-term stent that will need to be replaced every  few months. This information is not intended to replace advice given to you by your health care provider. Make sure you discuss any questions you have with your health care provider. Document Revised: 09/28/2017 Document Reviewed: 09/29/2017 Elsevier Patient Education  2020 [place] for Kidney Stones Laser therapy for kidney stones is a procedure to break up small, hard mineral deposits that form in the kidney (kidney stones). The procedure is done using a device that produces a focused beam of light (laser). The laser breaks up kidney stones into pieces that are small enough to be passed out of the body through urination or removed from the body during the procedure. You may need laser therapy if you have kidney stones that are painful or block your urinary tract. This procedure is done by inserting a tube (ureteroscope) into your kidney through the urethral opening. The urethra is the part of the body that drains urine from the bladder. In women, the urethra opens above the vaginal opening. In men, the  urethra opens at the tip of the penis. The ureteroscope is inserted through the urethra, and surgical instruments are moved through the bladder and the muscular tube that connects the kidney to the bladder (ureter) until they reach the kidney. Tell a health care provider about:  Any allergies you have.  All medicines you are taking, including vitamins, herbs, eye drops, creams, and over-the-counter medicines.  Any problems you or family members have had with anesthetic medicines.  Any blood disorders you have.  Any surgeries you have had.  Any medical conditions you have.  Whether you are pregnant or may be pregnant. What are the risks? Generally, this is a safe procedure. However, problems may occur, including:  Infection.  Bleeding.  Allergic reactions to medicines.  Damage to the urethra, bladder, or ureter.  Urinary tract infection (UTI).  Narrowing of  the urethra (urethral stricture).  Difficulty passing urine.  Blockage of the kidney caused by a fragment of kidney stone. What happens before the procedure? Medicines  Ask your health care provider about: ? Changing or stopping your regular medicines. This is especially important if you are taking diabetes medicines or blood thinners. ? Taking medicines such as aspirin and ibuprofen. These medicines can thin your blood. Do not take these medicines unless your health care provider tells you to take them. ? Taking over-the-counter medicines, vitamins, herbs, and supplements. Eating and drinking Follow instructions from your health care provider about eating and drinking, which may include:  8 hours before the procedure - stop eating heavy meals or foods, such as meat, fried foods, or fatty foods.  6 hours before the procedure - stop eating light meals or foods, such as toast or cereal.  6 hours before the procedure - stop drinking milk or drinks that contain milk.  2 hours before the procedure - stop drinking clear liquids. Staying hydrated Follow instructions from your health care provider about hydration, which may include:  Up to 2 hours before the procedure - you may continue to drink clear liquids, such as water, clear fruit juice, black coffee, and plain tea.  General instructions  You may have a physical exam before the procedure. You may also have tests, such as imaging tests and blood or urine tests.  If your ureter is too narrow, your health care provider may place a soft, flexible tube (stent) inside of it. The stent may be placed days or weeks before your laser therapy procedure.  Plan to have someone take you home from the hospital or clinic.  If you will be going home right after the procedure, plan to have someone stay with you for 24 hours.  Do not use any products that contain nicotine or tobacco for at least 4 weeks before the procedure. These products include  cigarettes, e-cigarettes, and chewing tobacco. If you need help quitting, ask your health care provider.  Ask your health care provider: ? How your surgical site will be marked or identified. ? What steps will be taken to help prevent infection. These may include:  Removing hair at the surgery site.  Washing skin with a germ-killing soap.  Taking antibiotic medicine. What happens during the procedure?   An IV will be inserted into one of your veins.  You will be given one or more of the following: ? A medicine to help you relax (sedative). ? A medicine to numb the area (local anesthetic). ? A medicine to make you fall asleep (general anesthetic).  A ureteroscope will be inserted  into your urethra. The ureteroscope will send images to a video screen in the operating room to guide your surgeon to the area of your kidney that will be treated.  A small, flexible tube will be threaded through the ureteroscope and into your bladder and ureter, up to your kidney.  The laser device will be inserted into your kidney through the tube. Your surgeon will pulse the laser on and off to break up kidney stones.  A surgical instrument that has a tiny wire basket may be inserted through the tube into your kidney to remove the pieces of broken kidney stone. The procedure may vary among health care providers and hospitals. What happens after the procedure?  Your blood pressure, heart rate, breathing rate, and blood oxygen level will be monitored until you leave the hospital or clinic.  You will be given pain medicine as needed.  You may continue to receive antibiotics.  You may have a stent temporarily placed in your ureter.  Do not drive for 24 hours if you were given a sedative during your procedure.  You may be given a strainer to collect any stone fragments that you pass in your urine. Your health care provider may have these tested. Summary  Laser therapy for kidney stones is a procedure  to break up kidney stones into pieces that are small enough to be passed out of the body through urination or removed during the procedure.  Follow instructions from your health care provider about eating and drinking before the procedure.  During the procedure, the ureteroscope will send images to a video screen to guide your surgeon to the area of your kidney that will be treated.  Do not drive for 24 hours if you were given a sedative during your procedure. This information is not intended to replace advice given to you by your health care provider. Make sure you discuss any questions you have with your health care provider. Document Revised: 09/02/2017 Document Reviewed: 09/02/2017 Elsevier Patient Education  [employer].   Ureteroscopy Ureteroscopy is a procedure to check for and treat problems inside part of the urinary tract. In this procedure, a thin, tube-shaped instrument with a light at the end (ureteroscope) is used to look at the inside of the kidneys and the ureters, which are the tubes that carry urine from the kidneys to the bladder. The ureteroscope is inserted into one or both of the ureters. You may need this procedure if you have frequent urinary tract infections (UTIs), blood in your urine, or a stone in one of your ureters. A ureteroscopy can be done to find the cause of urine blockage in a ureter and to evaluate other abnormalities inside the ureters or kidneys. If stones are found, they can be removed during the procedure. Polyps, abnormal tissue, and some types of tumors can also be removed or treated. The ureteroscope may also have a tool to remove tissue to be checked for disease under a microscope (biopsy). Tell a health care provider about:  Any allergies you have.  All medicines you are taking, including vitamins, herbs, eye drops, creams, and over-the-counter medicines.  Any problems you or family members have had with anesthetic medicines.  Any blood  disorders you have.  Any surgeries you have had.  Any medical conditions you have.  Whether you are pregnant or may be pregnant. What are the risks? Generally, this is a safe procedure. However, problems may occur, including:  Bleeding.  Infection.  Allergic reactions to  medicines.  Scarring that narrows the ureter (stricture).  Creating a hole in the ureter (perforation). What happens before the procedure? Staying hydrated Follow instructions from your health care provider about hydration, which may include:  Up to 2 hours before the procedure - you may continue to drink clear liquids, such as water, clear fruit juice, black coffee, and plain tea. Eating and drinking restrictions Follow instructions from your health care provider about eating and drinking, which may include:  8 hours before the procedure - stop eating heavy meals or foods such as meat, fried foods, or fatty foods.  6 hours before the procedure - stop eating light meals or foods, such as toast or cereal.  6 hours before the procedure - stop drinking milk or drinks that contain milk.  2 hours before the procedure - stop drinking clear liquids. Medicines  Ask your health care provider about: ? Changing or stopping your regular medicines. This is especially important if you are taking diabetes medicines or blood thinners. ? Taking medicines such as aspirin and ibuprofen. These medicines can thin your blood. Do not take these medicines before your procedure if your health care provider instructs you not to.  You may be given antibiotic medicine to help prevent infection. General instructions  You may have a urine sample taken to check for infection.  Plan to have someone take you home from the hospital or clinic. What happens during the procedure?   To reduce your risk of infection: ? Your health care team will wash or sanitize their hands. ? Your skin will be washed with soap.  An IV tube will be  inserted into one of your veins.  You will be given one of the following: ? A medicine to help you relax (sedative). ? A medicine to make you fall asleep (general anesthetic). ? A medicine that is injected into your spine to numb the area below and slightly above the injection site (spinal anesthetic).  To lower your risk of infection, you may be given an antibiotic medicine by an injection or through the IV tube.  The opening from which you urinate (urethra) will be cleaned with a germ-killing solution.  The ureteroscope will be passed through your urethra into your bladder.  A salt-water solution will flow through the ureteroscope to fill your bladder. This will help the health care provider see the openings of your ureters more clearly.  Then, the ureteroscope will be passed into your ureter. ? If a growth is found, a piece of it may be removed so it can be examined under a microscope (biopsy). ? If a stone is found, it may be removed through the ureteroscope, or the stone may be broken up using a laser, shock waves, or electrical energy. ? In some cases, if the ureter is too small, a tube may be inserted that keeps the ureter open (ureteral stent). The stent may be left in place for 1 or 2 weeks to keep the ureter open, and then the ureteroscopy procedure will be performed.  The scope will be removed, and your bladder will be emptied. The procedure may vary among health care providers and hospitals. What happens after the procedure?  Your blood pressure, heart rate, breathing rate, and blood oxygen level will be monitored until the medicines you were given have worn off.  You may be asked to urinate.  Donot drive for 24 hours if you were given a sedative. This information is not intended to replace advice given to  you by your health care provider. Make sure you discuss any questions you have with your health care provider. Document Revised: 12/04/2016 Document Reviewed:  10/04/2015 Elsevier Patient Education  2020 [employer].

## 2019-07-06 ENCOUNTER — Telehealth: Payer: Self-pay | Admitting: *Deleted

## 2019-07-06 LAB — URINALYSIS, COMPLETE
Bilirubin, UA: NEGATIVE
Glucose, UA: NEGATIVE
Nitrite, UA: NEGATIVE
Specific Gravity, UA: 1.025 (ref 1.005–1.030)
Urobilinogen, Ur: 0.2 mg/dL (ref 0.2–1.0)
pH, UA: 5.5 (ref 5.0–7.5)

## 2019-07-06 LAB — MICROSCOPIC EXAMINATION: Bacteria, UA: NONE SEEN

## 2019-07-06 NOTE — Telephone Encounter (Signed)
   [facility] Pre-operative Risk Assessment    HEARTCARE STAFF: - Please ensure there is not already an duplicate clearance open for this procedure. - Under Visit Info/Reason for Call, type in Other and utilize the format Clearance MM/DD/YY or Clearance TBD. Do not use dashes or single digits. - If request is for dental extraction, please clarify the # of teeth to be extracted.  Request for surgical clearance:  1. What type of surgery is being performed? LEFT URETEROSCOPY, LASER LITHOTRIPSY, STENT PLACEMENT    2. When is this surgery scheduled? 07/21/19   3. What type of clearance is required (medical clearance vs. Pharmacy clearance to hold med vs. Both)? MEDICAL  4. Are there any medications that need to be held prior to surgery and how long? STOP PLAVIX x 5-7 DAYS PRIOR TO SURGERY; STOP ASA x 7 DAYS PRIOR TO SURGERY   5. Practice name and name of physician performing surgery? [facility]; DR. Aaron Edelman SNINSKY   6. What is the office phone number? [phone]   7.   What is the office fax number? [phone] ATTN: AMY  8.   Anesthesia type (None, local, MAC, general) ? NOT LISTED   Julaine Hua 07/06/2019, 2:02 PM  _________________________________________________________________   (provider comments below)

## 2019-07-06 NOTE — Telephone Encounter (Signed)
Yes - he is at low risk of holding ASA (7 days) and plavix (5 days) prior to procedure. thanks

## 2019-07-07 NOTE — Telephone Encounter (Signed)
   Primary Cardiologist: Sherren Mocha, MD  Chart reviewed as part of pre-operative protocol coverage. Given past medical history and time since last visit, based on ACC/AHA guidelines, Jerome Rodriguez would be at acceptable risk for the planned procedure without further cardiovascular testing.   He may hold his aspirin for 7 days prior to his surgery and he may hold his Plavix for 5 days prior to his surgery.  Please resume as soon as hemostasis is achieved.  I will route this recommendation to the requesting party via Epic fax function and remove from pre-op pool.  Please call with questions.  Jossie Ng. Tniya Bowditch NP-C    07/07/2019, 7:36 AM [address] Office [phone] Fax [phone]

## 2019-07-08 LAB — CULTURE, URINE COMPREHENSIVE

## 2019-07-10 ENCOUNTER — Other Ambulatory Visit: Payer: Self-pay

## 2019-07-10 ENCOUNTER — Ambulatory Visit (INDEPENDENT_AMBULATORY_CARE_PROVIDER_SITE_OTHER): Payer: Medicare HMO | Admitting: Otolaryngology

## 2019-07-10 VITALS — Temp 97.7°F

## 2019-07-10 DIAGNOSIS — H9120 Sudden idiopathic hearing loss, unspecified ear: Secondary | ICD-10-CM

## 2019-07-10 DIAGNOSIS — H9312 Tinnitus, left ear: Secondary | ICD-10-CM | POA: Diagnosis not present

## 2019-07-10 NOTE — Progress Notes (Signed)
HPI: Jerome Rodriguez is a 64 y.o. male who presents is referred by Dr. Redmond School for evaluation of hearing loss.  Patient states that he suddenly lost his hearing in the left ear about a year and a half ago.  He brings with him a hearing test from [facility] outpatient audiology that demonstrated essentially normal hearing in the right ear with some slight presbycusis in the upper frequencies.  However the left ear demonstrated a moderate to severe mixed hearing loss with bone scores between 40 and 50 dB and air scores between 50 and 70 dB.  He also complains of chronic tinnitus in the left ear.  Denies any headache or vertigo..  Past Medical History:  Diagnosis Date  . Abnormal chest CT    a. 07/2012: scattered bilat noncalcified pulm nodules ranging in size from a few mm to a max of 32mm in LLL - rec f/u in 3-6 mos.  Marland Kitchen CAD (coronary artery disease)    a. NSTEMI 10/04 => LHC: mLAD 99%=> Taxus DES to mLAD and Taxus DES to dLAD;  b. ETT-Myoview 11/2011: normal, EF 73%, no ischemia; c. 07/2012 Cath/PCI: LM <10, LAD 90 ISRp/99 ISRd (3.0x38 Promus DES), LCX 64m, 30-40d, RCA 50-60p, 30d, EF 55-65%.  . Diabetes mellitus   . Dyslipidemia   . ED (erectile dysfunction)   . HTN (hypertension)   . Obesity   . Smoker    CIGARS   Past Surgical History:  Procedure Laterality Date  . APPENDECTOMY    . arthroscopic knee surgery    . CORONARY STENT PLACEMENT    . LEFT HEART CATHETERIZATION WITH CORONARY ANGIOGRAM N/A 07/11/2012   Procedure: LEFT HEART CATHETERIZATION WITH CORONARY ANGIOGRAM;  Surgeon: Peter M Martinique, MD;  Location: [facility] CATH LAB;  Service: Cardiovascular;  Laterality: N/A;  . right shoulder surgery     Social History   Socioeconomic History  . Marital status: Married    Spouse name: Not on file  . Number of children: Not on file  . Years of education: Not on file  . Highest education level: Not on file  Occupational History  . Occupation: taxi Geophysicist/field seismologist  Tobacco Use  . Smoking status: Light  Tobacco Smoker    Types: Cigars  . Smokeless tobacco: Never Used  . Tobacco comment: smoke 1--d for 25 years; quit 6 months ago. is now smoking ciagrs occasionally   Substance and Sexual Activity  . Alcohol use: No    Comment: rare   . Drug use: No  . Sexual activity: Not on file  Other Topics Concern  . Not on file  Social History Narrative   Married; full time.   Social Determinants of Health   Financial Resource Strain:   . Difficulty of Paying Living Expenses:   Food Insecurity:   . Worried About Charity fundraiser in the Last Year:   . Arboriculturist in the Last Year:   Transportation Needs:   . Film/video editor (Medical):   Marland Kitchen Lack of Transportation (Non-Medical):   Physical Activity:   . Days of Exercise per Week:   . Minutes of Exercise per Session:   Stress:   . Feeling of Stress :   Social Connections:   . Frequency of Communication with Friends and Family:   . Frequency of Social Gatherings with Friends and Family:   . Attends Religious Services:   . Active Member of Clubs or Organizations:   . Attends Archivist Meetings:   .  Marital Status:    Family History  Problem Relation Age of Onset  . Heart attack Mother        died in sleep  . Diabetic kidney disease Mother   . Diabetic kidney disease Father   . Heart attack Father   . Heart failure Father    Allergies  Allergen Reactions  . Effient [Prasugrel] Other (See Comments)    dizziness  . Lactose Intolerance (Gi)    Prior to Admission medications   Medication Sig Start Date End Date Taking? Authorizing Provider  aspirin 81 MG EC tablet Take 1 tablet (81 mg total) by mouth daily. 07/12/12  Yes Theora Gianotti, NP  atorvastatin (LIPITOR) 20 MG tablet TAKE 1 TABLET BY MOUTH DAILY. 06/19/19  Yes Denita Lung, MD  clopidogrel (PLAVIX) 75 MG tablet Take 1 tablet (75 mg total) by mouth daily. 05/02/19  Yes Sherren Mocha, MD  dapagliflozin propanediol (FARXIGA) 5 MG TABS tablet  Take 1 tablet (5 mg total) by mouth daily before breakfast. 05/30/19  Yes Denita Lung, MD  enalapril (VASOTEC) 2.5 MG tablet TAKE 1 TABLET (2.5 MG TOTAL) BY MOUTH DAILY. 07/03/19  Yes Denita Lung, MD  glucose blood (TRUETEST TEST) test strip 1 each by Other route 2 (two) times daily. Use as instructed (This is for Ture test DX: E11.9) 11/26/15  Yes Denita Lung, MD  Lancets Misc. MISC 1 each by Does not apply route 2 (two) times daily. (This is for ture test DX: E11.9) 07/30/14  Yes Denita Lung, MD  loratadine (CLARITIN) 10 MG tablet Take 10 mg by mouth daily.   Yes [provider]  metFORMIN (GLUCOPHAGE-XR) 750 MG 24 hr tablet Take 1 tablet by mouth once daily with breakfast 04/05/19  Yes Denita Lung, MD  metoprolol succinate (TOPROL-XL) 25 MG 24 hr tablet TAKE 1 TABLET (25 MG TOTAL) BY MOUTH DAILY. 07/03/19  Yes Denita Lung, MD  Multiple Vitamin (MULTIVITAMIN) tablet Take 1 tablet by mouth daily.     Yes [provider]  nitroGLYCERIN (NITROSTAT) 0.4 MG SL tablet Place 1 tablet (0.4 mg total) under the tongue every 5 (five) minutes x 3 doses as needed for chest pain. 11/01/17  Yes Sherren Mocha, MD     Positive ROS: Otherwise negative  All other systems have been reviewed and were otherwise negative with the exception of those mentioned in the HPI and as above.  Physical Exam: Constitutional: Alert, well-appearing, no acute distress Ears: External ears without lesions or tenderness. Ear canals are clear bilaterally.  Both TMs are clear with no middle ear space abnormality noted on microscopic exam.  On tuning fork testing Weber lateralized to the right ear.  On Rinne testing AC was greater than BC on the right side but AC was equivocal to [facility] on the left side. Nasal: External nose without lesions Clear nasal passages Oral: Lips and gums without lesions. Tongue and palate mucosa without lesions. Posterior oropharynx clear. Neck: No palpable adenopathy or  masses Respiratory: Breathing comfortably  Skin: No facial/neck lesions or rash noted.  Procedures  Assessment: Findings are consistent with sudden sensorineural hearing loss in the left ear although he has a mild conductive component on the left side also.  Plan: We will plan on scheduling MRI scan to rule out retrocochlear pathology or middle ear pathology.  However patient has normal TM exam in the office today. He will call us following the MRI scan concerning results. Reviewed with him only  treatment for his hearing loss would be hearing aids. Also reviewed with him concerning using masking noise for the tinnitus and gave him samples of Lipo flavonoid to try which is beneficial in some people.   Radene Journey, MD   CC:

## 2019-07-11 ENCOUNTER — Other Ambulatory Visit (INDEPENDENT_AMBULATORY_CARE_PROVIDER_SITE_OTHER): Payer: Self-pay

## 2019-07-11 ENCOUNTER — Other Ambulatory Visit: Payer: Self-pay | Admitting: Family Medicine

## 2019-07-11 ENCOUNTER — Telehealth: Payer: Self-pay | Admitting: Family Medicine

## 2019-07-11 DIAGNOSIS — E118 Type 2 diabetes mellitus with unspecified complications: Secondary | ICD-10-CM

## 2019-07-11 DIAGNOSIS — H9042 Sensorineural hearing loss, unilateral, left ear, with unrestricted hearing on the contralateral side: Secondary | ICD-10-CM

## 2019-07-11 MED ORDER — METFORMIN HCL ER 750 MG PO TB24: 750.0000 mg | ORAL_TABLET | Freq: Two times a day (BID) | ORAL | 1 refills | Status: DC

## 2019-07-11 NOTE — Telephone Encounter (Signed)
States needs refill Metformin to [facility] also states the directions are incorrect should be 1 tablet BID, says has taken it this way for years

## 2019-07-14 ENCOUNTER — Encounter (INDEPENDENT_AMBULATORY_CARE_PROVIDER_SITE_OTHER): Payer: Self-pay

## 2019-07-20 ENCOUNTER — Encounter (HOSPITAL_COMMUNITY): Payer: Self-pay | Admitting: Emergency Medicine

## 2019-07-20 ENCOUNTER — Other Ambulatory Visit: Payer: Self-pay

## 2019-07-20 ENCOUNTER — Emergency Department (HOSPITAL_COMMUNITY): Payer: Medicare HMO

## 2019-07-20 ENCOUNTER — Emergency Department (HOSPITAL_COMMUNITY)
Admission: EM | Admit: 2019-07-20 | Discharge: 2019-07-20 | Disposition: A | Discharge status: Home / Self Care | Payer: Medicare HMO | Attending: Emergency Medicine | Admitting: Emergency Medicine

## 2019-07-20 DIAGNOSIS — F17290 Nicotine dependence, other tobacco product, uncomplicated: Secondary | ICD-10-CM | POA: Diagnosis not present

## 2019-07-20 DIAGNOSIS — E1169 Type 2 diabetes mellitus with other specified complication: Secondary | ICD-10-CM | POA: Diagnosis not present

## 2019-07-20 DIAGNOSIS — R0902 Hypoxemia: Secondary | ICD-10-CM | POA: Diagnosis not present

## 2019-07-20 DIAGNOSIS — Z7982 Long term (current) use of aspirin: Secondary | ICD-10-CM | POA: Diagnosis not present

## 2019-07-20 DIAGNOSIS — Y998 Other external cause status: Secondary | ICD-10-CM | POA: Diagnosis not present

## 2019-07-20 DIAGNOSIS — G4489 Other headache syndrome: Secondary | ICD-10-CM | POA: Diagnosis not present

## 2019-07-20 DIAGNOSIS — S0990XA Unspecified injury of head, initial encounter: Secondary | ICD-10-CM | POA: Diagnosis not present

## 2019-07-20 DIAGNOSIS — I2510 Atherosclerotic heart disease of native coronary artery without angina pectoris: Secondary | ICD-10-CM | POA: Insufficient documentation

## 2019-07-20 DIAGNOSIS — Z7984 Long term (current) use of oral hypoglycemic drugs: Secondary | ICD-10-CM | POA: Diagnosis not present

## 2019-07-20 DIAGNOSIS — Y9289 Other specified places as the place of occurrence of the external cause: Secondary | ICD-10-CM | POA: Insufficient documentation

## 2019-07-20 DIAGNOSIS — I10 Essential (primary) hypertension: Secondary | ICD-10-CM | POA: Insufficient documentation

## 2019-07-20 DIAGNOSIS — E1121 Type 2 diabetes mellitus with diabetic nephropathy: Secondary | ICD-10-CM | POA: Insufficient documentation

## 2019-07-20 DIAGNOSIS — R519 Headache, unspecified: Secondary | ICD-10-CM | POA: Diagnosis not present

## 2019-07-20 DIAGNOSIS — Y9389 Activity, other specified: Secondary | ICD-10-CM | POA: Diagnosis not present

## 2019-07-20 MED ORDER — ONDANSETRON 4 MG PO TBDP
4.0000 mg | ORAL_TABLET | Freq: Three times a day (TID) | ORAL | 0 refills | Status: DC | PRN
Start: 2019-07-20 — End: 2019-11-22

## 2019-07-20 NOTE — Discharge Instructions (Signed)
Tylenol for Headache with zofran as needed for nausea
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
## 2019-07-24 ENCOUNTER — Other Ambulatory Visit: Payer: Self-pay

## 2019-07-24 ENCOUNTER — Other Ambulatory Visit: Payer: Medicare HMO

## 2019-07-24 DIAGNOSIS — N200 Calculus of kidney: Secondary | ICD-10-CM | POA: Diagnosis not present

## 2019-07-24 DIAGNOSIS — Z01818 Encounter for other preprocedural examination: Secondary | ICD-10-CM

## 2019-07-26 ENCOUNTER — Other Ambulatory Visit: Payer: Self-pay | Admitting: Urology

## 2019-07-26 DIAGNOSIS — N200 Calculus of kidney: Secondary | ICD-10-CM

## 2019-07-28 ENCOUNTER — Encounter
Admission: RE | Admit: 2019-07-28 | Discharge: 2019-07-28 | Disposition: A | Discharge status: Home / Self Care | Payer: Medicare HMO | Source: Ambulatory Visit | Attending: Urology | Admitting: Urology

## 2019-07-28 ENCOUNTER — Other Ambulatory Visit: Payer: Self-pay

## 2019-07-28 HISTORY — DX: Personal history of urinary calculi: Z87.442

## 2019-07-28 HISTORY — DX: Acute myocardial infarction, unspecified: I21.9

## 2019-07-28 NOTE — Patient Instructions (Signed)
Your procedure is scheduled on: 08/04/19 Report to [facility]. To find out your arrival time please call [phone] between 1PM - 3PM on 08/03/19.  Remember: Instructions that are not followed completely may result in serious medical risk, up to and including death, or upon the discretion of your surgeon and anesthesiologist your surgery may need to be rescheduled.     _X__ 1. Do not eat food after midnight the night before your procedure.                 No gum chewing or hard candies. You may drink clear liquids up to 2 hours                 before you are scheduled to arrive for your surgery- DO not drink clear                 liquids within 2 hours of the start of your surgery.                 Clear Liquids include:  water, apple juice without pulp, clear carbohydrate                 drink such as Clearfast or Gatorade, Black Coffee or Tea (Do not add                 anything to coffee or tea). Diabetics water only  __X__2.  On the morning of surgery brush your teeth with toothpaste and water, you                 may rinse your mouth with mouthwash if you wish.  Do not swallow any              toothpaste of mouthwash.     _X__ 3.  No Alcohol for 24 hours before or after surgery.   _X__ 4.  Do Not Smoke or use e-cigarettes For 24 Hours Prior to Your Surgery.                 Do not use any chewable tobacco products for at least 6 hours prior to                 surgery.  ____  5.  Bring all medications with you on the day of surgery if instructed.   __X__  6.  Notify your doctor if there is any change in your medical condition      (cold, fever, infections).     Do not wear jewelry, make-up, hairpins, clips or nail polish. Do not wear lotions, powders, or perfumes.  Do not shave 48 hours prior to surgery. Men may shave face and neck. Do not bring valuables to the hospital.    [facility] is not responsible for any belongings or  valuables.  Contacts, dentures/partials or body piercings may not be worn into surgery. Bring a case for your contacts, glasses or hearing aids, a denture cup will be supplied. Leave your suitcase in the car. After surgery it may be brought to your room. For patients admitted to the hospital, discharge time is determined by your treatment team.   Patients discharged the day of surgery will not be allowed to drive home.   Please read over the following fact sheets that you were given:   MRSA Information  __X__ Take these medicines the morning of surgery with A SIP OF WATER:  1. atorvastatin (LIPITOR) 20 MG tablet  2. loratadine (CLARITIN) 10 MG tablet  3. metoprolol succinate (TOPROL-XL) 25 MG 24 hr tablet  4.  5.  6.  ____ Fleet Enema (as directed)   ____ Use CHG Soap/SAGE wipes as directed  ____ Use inhalers on the day of surgery  __X__ Stop metformin/Janumet/Farxiga 2 days prior to surgery    ____ Take 1/2 of usual insulin dose the night before surgery. No insulin the morning          of surgery.   __X__ Stop Blood Thinners Coumadin/Plavix/Xarelto/Pleta/Pradaxa/Eliquis/Effient/Aspirin AS PREVIOUSLY RECOMMENDED     __X__ Stop Anti-inflammatories 7 days before surgery such as Advil, Ibuprofen, Motrin,  BC or Goodies Powder, Naprosyn, Naproxen, Aleve    __X__ Stop all herbal supplements, fish oil or vitamin E until after surgery.    ____ Bring C-Pap to the hospital.

## 2019-07-29 ENCOUNTER — Ambulatory Visit
Admission: RE | Admit: 2019-07-29 | Discharge: 2019-07-29 | Disposition: A | Discharge status: Home / Self Care | Payer: Medicare HMO | Source: Ambulatory Visit | Attending: Otolaryngology | Admitting: Otolaryngology

## 2019-07-29 DIAGNOSIS — H9042 Sensorineural hearing loss, unilateral, left ear, with unrestricted hearing on the contralateral side: Secondary | ICD-10-CM

## 2019-07-29 MED ORDER — GADOBENATE DIMEGLUMINE 529 MG/ML IV SOLN
19.0000 mL | Freq: Once | INTRAVENOUS | Status: AC | PRN
  Administered 2019-07-29: 19 mL via INTRAVENOUS

## 2019-07-30 LAB — CULTURE, URINE COMPREHENSIVE

## 2019-08-02 ENCOUNTER — Other Ambulatory Visit
Admission: RE | Admit: 2019-08-02 | Discharge: 2019-08-02 | Disposition: A | Discharge status: Home / Self Care | Payer: Medicare HMO | Source: Ambulatory Visit | Attending: Urology | Admitting: Urology

## 2019-08-02 ENCOUNTER — Other Ambulatory Visit: Payer: Self-pay

## 2019-08-02 DIAGNOSIS — Z20822 Contact with and (suspected) exposure to covid-19: Secondary | ICD-10-CM | POA: Insufficient documentation

## 2019-08-02 DIAGNOSIS — Z01812 Encounter for preprocedural laboratory examination: Secondary | ICD-10-CM | POA: Insufficient documentation

## 2019-08-02 LAB — SARS CORONAVIRUS 2 (TAT 6-24 HRS): SARS Coronavirus 2: NEGATIVE

## 2019-08-04 ENCOUNTER — Ambulatory Visit
Admission: RE | Admit: 2019-08-04 | Discharge: 2019-08-04 | Disposition: A | Discharge status: Home / Self Care | Payer: Medicare HMO | Attending: Urology | Admitting: Urology

## 2019-08-04 ENCOUNTER — Other Ambulatory Visit: Payer: Self-pay

## 2019-08-04 ENCOUNTER — Ambulatory Visit: Payer: Medicare HMO

## 2019-08-04 ENCOUNTER — Ambulatory Visit: Payer: Medicare HMO | Admitting: Anesthesiology

## 2019-08-04 ENCOUNTER — Encounter: Payer: Self-pay | Admitting: Urology

## 2019-08-04 ENCOUNTER — Telehealth: Payer: Self-pay | Admitting: Urology

## 2019-08-04 ENCOUNTER — Encounter
Admission: RE | Disposition: A | Discharge status: Home / Self Care | Payer: Self-pay | Source: Home / Self Care | Attending: Urology

## 2019-08-04 DIAGNOSIS — R3129 Other microscopic hematuria: Secondary | ICD-10-CM | POA: Diagnosis not present

## 2019-08-04 DIAGNOSIS — Z8249 Family history of ischemic heart disease and other diseases of the circulatory system: Secondary | ICD-10-CM | POA: Insufficient documentation

## 2019-08-04 DIAGNOSIS — Z955 Presence of coronary angioplasty implant and graft: Secondary | ICD-10-CM | POA: Diagnosis not present

## 2019-08-04 DIAGNOSIS — I10 Essential (primary) hypertension: Secondary | ICD-10-CM | POA: Insufficient documentation

## 2019-08-04 DIAGNOSIS — F17200 Nicotine dependence, unspecified, uncomplicated: Secondary | ICD-10-CM | POA: Insufficient documentation

## 2019-08-04 DIAGNOSIS — E119 Type 2 diabetes mellitus without complications: Secondary | ICD-10-CM | POA: Diagnosis not present

## 2019-08-04 DIAGNOSIS — Z833 Family history of diabetes mellitus: Secondary | ICD-10-CM | POA: Insufficient documentation

## 2019-08-04 DIAGNOSIS — Z6830 Body mass index (BMI) 30.0-30.9, adult: Secondary | ICD-10-CM | POA: Diagnosis not present

## 2019-08-04 DIAGNOSIS — Z6831 Body mass index (BMI) 31.0-31.9, adult: Secondary | ICD-10-CM | POA: Diagnosis not present

## 2019-08-04 DIAGNOSIS — I2510 Atherosclerotic heart disease of native coronary artery without angina pectoris: Secondary | ICD-10-CM | POA: Insufficient documentation

## 2019-08-04 DIAGNOSIS — Z9049 Acquired absence of other specified parts of digestive tract: Secondary | ICD-10-CM | POA: Diagnosis not present

## 2019-08-04 DIAGNOSIS — I252 Old myocardial infarction: Secondary | ICD-10-CM | POA: Diagnosis not present

## 2019-08-04 DIAGNOSIS — Z87442 Personal history of urinary calculi: Secondary | ICD-10-CM | POA: Insufficient documentation

## 2019-08-04 DIAGNOSIS — N200 Calculus of kidney: Secondary | ICD-10-CM | POA: Insufficient documentation

## 2019-08-04 DIAGNOSIS — E669 Obesity, unspecified: Secondary | ICD-10-CM | POA: Insufficient documentation

## 2019-08-04 DIAGNOSIS — E785 Hyperlipidemia, unspecified: Secondary | ICD-10-CM | POA: Diagnosis not present

## 2019-08-04 DIAGNOSIS — F17290 Nicotine dependence, other tobacco product, uncomplicated: Secondary | ICD-10-CM | POA: Diagnosis not present

## 2019-08-04 HISTORY — PX: CYSTOSCOPY/URETEROSCOPY/HOLMIUM LASER/STENT PLACEMENT: SHX6546

## 2019-08-04 LAB — GLUCOSE, CAPILLARY
Glucose-Capillary: 160 mg/dL — ABNORMAL HIGH (ref 70–99)
Glucose-Capillary: 202 mg/dL — ABNORMAL HIGH (ref 70–99)

## 2019-08-04 SURGERY — CYSTOSCOPY/URETEROSCOPY/HOLMIUM LASER/STENT PLACEMENT
Anesthesia: General | Laterality: Left

## 2019-08-04 MED ORDER — DEXMEDETOMIDINE HCL IN NACL 200 MCG/50ML IV SOLN
INTRAVENOUS | Status: DC | PRN
  Administered 2019-08-04: 4 ug via INTRAVENOUS

## 2019-08-04 MED ORDER — EPHEDRINE SULFATE 50 MG/ML IJ SOLN
INTRAMUSCULAR | Status: DC | PRN
  Administered 2019-08-04: 10 mg via INTRAVENOUS
  Administered 2019-08-04: 5 mg via INTRAVENOUS

## 2019-08-04 MED ORDER — FAMOTIDINE 20 MG PO TABS
ORAL_TABLET | ORAL | Status: AC
  Filled 2019-08-04: qty 1

## 2019-08-04 MED ORDER — ACETAMINOPHEN 10 MG/ML IV SOLN
INTRAVENOUS | Status: DC | PRN
  Administered 2019-08-04: 1000 mg via INTRAVENOUS

## 2019-08-04 MED ORDER — CHLORHEXIDINE GLUCONATE 0.12 % MT SOLN
OROMUCOSAL | Status: AC
  Administered 2019-08-04: 15 mL via OROMUCOSAL
  Filled 2019-08-04: qty 15

## 2019-08-04 MED ORDER — FENTANYL CITRATE (PF) 100 MCG/2ML IJ SOLN
INTRAMUSCULAR | Status: DC | PRN
  Administered 2019-08-04: 50 ug via INTRAVENOUS

## 2019-08-04 MED ORDER — FAMOTIDINE 20 MG PO TABS
20.0000 mg | ORAL_TABLET | Freq: Once | ORAL | Status: AC
  Administered 2019-08-04: 20 mg via ORAL

## 2019-08-04 MED ORDER — GLYCOPYRROLATE 0.2 MG/ML IJ SOLN
INTRAMUSCULAR | Status: AC
  Filled 2019-08-04: qty 1

## 2019-08-04 MED ORDER — DEXAMETHASONE SODIUM PHOSPHATE 10 MG/ML IJ SOLN
INTRAMUSCULAR | Status: DC | PRN
  Administered 2019-08-04: 10 mg via INTRAVENOUS

## 2019-08-04 MED ORDER — FENTANYL CITRATE (PF) 100 MCG/2ML IJ SOLN
INTRAMUSCULAR | Status: AC
  Filled 2019-08-04: qty 2

## 2019-08-04 MED ORDER — PHENYLEPHRINE HCL (PRESSORS) 10 MG/ML IV SOLN
INTRAVENOUS | Status: AC
  Filled 2019-08-04: qty 1

## 2019-08-04 MED ORDER — SUGAMMADEX SODIUM 500 MG/5ML IV SOLN
INTRAVENOUS | Status: DC | PRN
  Administered 2019-08-04: 500 mg via INTRAVENOUS

## 2019-08-04 MED ORDER — ROCURONIUM BROMIDE 100 MG/10ML IV SOLN
INTRAVENOUS | Status: DC | PRN
  Administered 2019-08-04: 40 mg via INTRAVENOUS

## 2019-08-04 MED ORDER — SODIUM CHLORIDE 0.9 % IV SOLN: INTRAVENOUS | Status: DC

## 2019-08-04 MED ORDER — LIDOCAINE HCL (CARDIAC) PF 100 MG/5ML IV SOSY
PREFILLED_SYRINGE | INTRAVENOUS | Status: DC | PRN
  Administered 2019-08-04: 100 mg via INTRAVENOUS

## 2019-08-04 MED ORDER — FENTANYL CITRATE (PF) 100 MCG/2ML IJ SOLN: 25.0000 ug | INTRAMUSCULAR | Status: DC | PRN

## 2019-08-04 MED ORDER — ROCURONIUM BROMIDE 10 MG/ML (PF) SYRINGE
PREFILLED_SYRINGE | INTRAVENOUS | Status: AC
  Filled 2019-08-04: qty 10

## 2019-08-04 MED ORDER — CIPROFLOXACIN IN D5W 400 MG/200ML IV SOLN
INTRAVENOUS | Status: DC | PRN
Start: 2019-08-04 — End: 2019-08-04
  Administered 2019-08-04: 400 mg via INTRAVENOUS

## 2019-08-04 MED ORDER — ONDANSETRON HCL 4 MG/2ML IJ SOLN
INTRAMUSCULAR | Status: AC
  Filled 2019-08-04: qty 4

## 2019-08-04 MED ORDER — VASOPRESSIN 20 UNIT/ML IV SOLN
INTRAVENOUS | Status: AC
  Filled 2019-08-04: qty 1

## 2019-08-04 MED ORDER — VASOPRESSIN 20 UNIT/ML IV SOLN
INTRAVENOUS | Status: DC | PRN
  Administered 2019-08-04: 2 [IU] via INTRAVENOUS

## 2019-08-04 MED ORDER — MIDAZOLAM HCL 2 MG/2ML IJ SOLN
INTRAMUSCULAR | Status: DC | PRN
  Administered 2019-08-04 (×2): 1 mg via INTRAVENOUS

## 2019-08-04 MED ORDER — BELLADONNA ALKALOIDS-OPIUM 16.2-60 MG RE SUPP
RECTAL | Status: AC
  Filled 2019-08-04: qty 1

## 2019-08-04 MED ORDER — CEFAZOLIN SODIUM-DEXTROSE 2-4 GM/100ML-% IV SOLN: 2.0000 g | INTRAVENOUS | Status: DC

## 2019-08-04 MED ORDER — SUGAMMADEX SODIUM 500 MG/5ML IV SOLN
INTRAVENOUS | Status: AC
  Filled 2019-08-04: qty 5

## 2019-08-04 MED ORDER — PROPOFOL 10 MG/ML IV BOLUS
INTRAVENOUS | Status: AC
  Filled 2019-08-04: qty 20

## 2019-08-04 MED ORDER — CHLORHEXIDINE GLUCONATE 0.12 % MT SOLN: 15.0000 mL | Freq: Once | OROMUCOSAL | Status: AC

## 2019-08-04 MED ORDER — PROPOFOL 10 MG/ML IV BOLUS
INTRAVENOUS | Status: DC | PRN
  Administered 2019-08-04: 150 mg via INTRAVENOUS

## 2019-08-04 MED ORDER — OXYCODONE HCL 5 MG/5ML PO SOLN: 5.0000 mg | Freq: Once | ORAL | Status: DC | PRN

## 2019-08-04 MED ORDER — ORAL CARE MOUTH RINSE: 15.0000 mL | Freq: Once | OROMUCOSAL | Status: AC

## 2019-08-04 MED ORDER — KETOROLAC TROMETHAMINE 30 MG/ML IJ SOLN
INTRAMUSCULAR | Status: DC | PRN
  Administered 2019-08-04: 15 mg via INTRAVENOUS

## 2019-08-04 MED ORDER — ACETAMINOPHEN 10 MG/ML IV SOLN
INTRAVENOUS | Status: AC
  Filled 2019-08-04: qty 100

## 2019-08-04 MED ORDER — LIDOCAINE HCL (PF) 2 % IJ SOLN
INTRAMUSCULAR | Status: AC
  Filled 2019-08-04: qty 20

## 2019-08-04 MED ORDER — EPHEDRINE 5 MG/ML INJ
INTRAVENOUS | Status: AC
  Filled 2019-08-04: qty 10

## 2019-08-04 MED ORDER — SUCCINYLCHOLINE CHLORIDE 200 MG/10ML IV SOSY
PREFILLED_SYRINGE | INTRAVENOUS | Status: AC
  Filled 2019-08-04: qty 10

## 2019-08-04 MED ORDER — OXYCODONE HCL 5 MG PO TABS: 5.0000 mg | ORAL_TABLET | Freq: Once | ORAL | Status: DC | PRN

## 2019-08-04 MED ORDER — GLYCOPYRROLATE 0.2 MG/ML IJ SOLN
INTRAMUSCULAR | Status: DC | PRN
  Administered 2019-08-04: .2 mg via INTRAVENOUS

## 2019-08-04 MED ORDER — SUCCINYLCHOLINE CHLORIDE 20 MG/ML IJ SOLN
INTRAMUSCULAR | Status: DC | PRN
  Administered 2019-08-04: 100 mg via INTRAVENOUS

## 2019-08-04 MED ORDER — MIDAZOLAM HCL 2 MG/2ML IJ SOLN
INTRAMUSCULAR | Status: AC
  Filled 2019-08-04: qty 2

## 2019-08-04 MED ORDER — DEXMEDETOMIDINE HCL IN NACL 80 MCG/20ML IV SOLN
INTRAVENOUS | Status: AC
  Filled 2019-08-04: qty 20

## 2019-08-04 MED ORDER — CIPROFLOXACIN IN D5W 400 MG/200ML IV SOLN
INTRAVENOUS | Status: AC
  Filled 2019-08-04: qty 200

## 2019-08-04 MED ORDER — OXYBUTYNIN CHLORIDE ER 10 MG PO TB24
10.0000 mg | ORAL_TABLET | Freq: Every day | ORAL | 0 refills | Status: AC | PRN
Start: 2019-08-04 — End: 2019-08-18

## 2019-08-04 MED ORDER — TAMSULOSIN HCL 0.4 MG PO CAPS
0.4000 mg | ORAL_CAPSULE | Freq: Every day | ORAL | 0 refills | Status: DC
Start: 2019-08-04 — End: 2019-11-22

## 2019-08-04 MED ORDER — HYDROCODONE-ACETAMINOPHEN 5-325 MG PO TABS: 1.0000 | ORAL_TABLET | ORAL | 0 refills | Status: AC | PRN

## 2019-08-04 MED ORDER — PROPOFOL 10 MG/ML IV BOLUS
INTRAVENOUS | Status: AC
  Filled 2019-08-04: qty 60

## 2019-08-04 MED ORDER — CEFAZOLIN SODIUM-DEXTROSE 2-4 GM/100ML-% IV SOLN
INTRAVENOUS | Status: AC
  Filled 2019-08-04: qty 100

## 2019-08-04 MED ORDER — ONDANSETRON HCL 4 MG/2ML IJ SOLN
INTRAMUSCULAR | Status: DC | PRN
  Administered 2019-08-04: 4 mg via INTRAVENOUS

## 2019-08-04 MED FILL — TAMSULOSIN HCL 0.4 MG CAP: 0.4 | 14 days supply | Qty: 14 | Fill #0

## 2019-08-04 MED FILL — OXYBUTYNIN CL ER 10 MG TAB: 10 | 14 days supply | Qty: 14 | Fill #0

## 2019-08-04 MED FILL — HYDROCODON-APAP 5-325: 5-325 | 5 days supply | Qty: 12 | Fill #0

## 2019-08-04 SURGICAL SUPPLY — 31 items
BAG DRAIN CYSTO-URO LG1000N (MISCELLANEOUS) ×3 IMPLANT
BRUSH SCRUB EZ 1% IODOPHOR (MISCELLANEOUS) ×3 IMPLANT
CATH URETL 5X70 OPEN END (CATHETERS) IMPLANT
CNTNR SPEC 2.5X3XGRAD LEK (MISCELLANEOUS)
CONT SPEC 4OZ STER OR WHT (MISCELLANEOUS)
CONT SPEC 4OZ STRL OR WHT (MISCELLANEOUS)
CONTAINER SPEC 2.5X3XGRAD LEK (MISCELLANEOUS) IMPLANT
DRAPE UTILITY 15X26 TOWEL STRL (DRAPES) ×3 IMPLANT
FIBER LASER TRAC TIP (UROLOGICAL SUPPLIES) ×3 IMPLANT
GLOVE BIOGEL PI IND STRL 7.5 (GLOVE) ×1 IMPLANT
GLOVE BIOGEL PI INDICATOR 7.5 (GLOVE) ×2
GOWN STRL REUS W/ TWL LRG LVL3 (GOWN DISPOSABLE) ×1 IMPLANT
GOWN STRL REUS W/ TWL XL LVL3 (GOWN DISPOSABLE) ×1 IMPLANT
GOWN STRL REUS W/TWL LRG LVL3 (GOWN DISPOSABLE) ×3
GOWN STRL REUS W/TWL XL LVL3 (GOWN DISPOSABLE) ×3
GUIDEWIRE STR DUAL SENSOR (WIRE) ×3 IMPLANT
INFUSOR MANOMETER BAG 3000ML (MISCELLANEOUS) ×3 IMPLANT
INTRODUCER DILATOR DOUBLE (INTRODUCER) IMPLANT
KIT TURNOVER CYSTO (KITS) ×3 IMPLANT
PACK CYSTO AR (MISCELLANEOUS) ×3 IMPLANT
SET CYSTO W/LG BORE CLAMP LF (SET/KITS/TRAYS/PACK) ×3 IMPLANT
SHEATH URETERAL 12FRX35CM (MISCELLANEOUS) IMPLANT
SOL .9 NS 3000ML IRR  AL (IV SOLUTION) ×3
SOL .9 NS 3000ML IRR AL (IV SOLUTION) ×1
SOL .9 NS 3000ML IRR UROMATIC (IV SOLUTION) ×1 IMPLANT
STENT URET 6FRX24 CONTOUR (STENTS) IMPLANT
STENT URET 6FRX26 CONTOUR (STENTS) ×2 IMPLANT
SURGILUBE 2OZ TUBE FLIPTOP (MISCELLANEOUS) ×3 IMPLANT
SYR 10ML LL (SYRINGE) ×3 IMPLANT
VALVE UROSEAL ADJ ENDO (VALVE) ×2 IMPLANT
WATER STERILE IRR 1000ML POUR (IV SOLUTION) ×3 IMPLANT

## 2019-08-04 NOTE — Op Note (Signed)
Date of procedure: 08/04/19  Preoperative diagnosis:  1. Left 1.2 cm lower pole stone  Postoperative diagnosis:  1. Same  Procedure: 1. Cystoscopy, left retrograde pyelogram with intraoperative interpretation, left ureteroscopy, laser lithotripsy, left ureteral stent placement  Surgeon: [name], MD  Anesthesia: General  Complications: None  Intraoperative findings:  1.  Normal cystoscopy 2.  Uncomplicated dusting of left renal stone  EBL: Minimal  Specimens: None  Drains: Left 6 French by 26 cm ureteral stent  Indication: Jerome Rodriguez is a 64 y.o. patient with a large 1.2 cm left lower pole stone who opted for ureteroscopy for management.  After reviewing the management options for treatment, they elected to proceed with the above surgical procedure(s). We have discussed the potential benefits and risks of the procedure, side effects of the proposed treatment, the likelihood of the patient achieving the goals of the procedure, and any potential problems that might occur during the procedure or recuperation. Informed consent has been obtained.  Description of procedure:  The patient was taken to the operating room and general anesthesia was induced. SCDs were placed for DVT prophylaxis. The patient was placed in the dorsal lithotomy position, prepped and draped in the usual sterile fashion, and preoperative antibiotics(Cipro) were administered. A preoperative time-out was performed.   A 21 French rigid cystoscope was used to intubate the urethra and a normal-appearing urethra was followed proximally into the bladder.  The prostate was moderate in size.  Thorough cystoscopy revealed no abnormalities.  A sensor wire was advanced into the left ureteral orifice and passed up to the kidney under fluoroscopic vision.  A dual-lumen access catheter was used to add a 2nd wire.  A 12/14 French ureteral access sheath was gently advanced over the wire under fluoroscopic vision and  passed easily up into the proximal ureter.  A single channel digital flexible ureteroscope was then advanced up into the kidney and thorough cystoscopy demonstrated a yellow crystalline stone in the lower pole.  This could clearly be seen on fluoroscopy.  A 200 m laser fiber on settings of 0.5 J and 40 Hz was used to methodically dust the stone until no fragments > 1 mm remained.  Thorough pyeloscopy revealed no residual fragments.  Contrast was injected into the collecting system for retrograde pyelogram and showed no filling defects or extravasation.  Careful pullback ureteroscopy demonstrated no residual ureteral fragments or ureteral injury.  The rigid cystoscope was backloaded over the wire, and a 6 [place] by 26 cm ureteral stent was uneventfully placed with an excellent curl in the renal pelvis, as well as under direct vision the bladder.  The bladder was drained, a belladonna suppository was placed, and this concluded our procedure.  Disposition: Stable to PACU  Plan: Stent removal in 1 week in clinic  [name], MD

## 2019-08-04 NOTE — Telephone Encounter (Signed)
-----   Message from Billey Co, MD sent at 08/04/2019  8:43 AM EDT ----- Regarding: visit change His 8/11 visit should be a stent removal, thanks  [name], MD 08/04/2019

## 2019-08-04 NOTE — Anesthesia Postprocedure Evaluation (Signed)
Anesthesia Post Note  Patient: Jerome Rodriguez  Procedure(s) Performed: CYSTOSCOPY/URETEROSCOPY/HOLMIUM LASER/STENT PLACEMENT (Left )  Patient location during evaluation: PACU Anesthesia Type: General Level of consciousness: awake and alert Pain management: pain level controlled Vital Signs Assessment: post-procedure vital signs reviewed and stable Respiratory status: spontaneous breathing, nonlabored ventilation, respiratory function stable and patient connected to nasal cannula oxygen Cardiovascular status: blood pressure returned to baseline and stable Postop Assessment: no apparent nausea or vomiting Anesthetic complications: no   No complications documented.   Last Vitals:  Vitals:   08/04/19 0850 08/04/19 0903  BP: (!) 95/59 (!) 89/54  Pulse: 81 84  Resp: 12 16  Temp: 36.6 C (!) 35.9 C  SpO2: 95% 94%    Last Pain:  Vitals:   08/04/19 0903  TempSrc: Tympanic  PainSc: 0-No pain                 Precious Haws Bela Nyborg

## 2019-08-04 NOTE — Transfer of Care (Signed)
Immediate Anesthesia Transfer of Care Note  Patient: Jerome Rodriguez  Procedure(s) Performed: CYSTOSCOPY/URETEROSCOPY/HOLMIUM LASER/STENT PLACEMENT (Left )  Patient Location: PACU  Anesthesia Type:General  Level of Consciousness: awake, drowsy and patient cooperative  Airway & Oxygen Therapy: Patient Spontanous Breathing and Patient connected to face mask oxygen  Post-op Assessment: Report given to RN and Post -op Vital signs reviewed and stable  Post vital signs: Reviewed and stable  Last Vitals:  Vitals Value Taken Time  BP    Temp    Pulse    Resp    SpO2      Last Pain:  Vitals:   08/04/19 0623  TempSrc: Tympanic  PainSc: 0-No pain         Complications: No complications documented.

## 2019-08-04 NOTE — Anesthesia Preprocedure Evaluation (Signed)
Anesthesia Evaluation  Patient identified by MRN, date of birth, ID band Patient awake    Reviewed: Allergy & Precautions, H&P , NPO status , Patient's Chart, lab work & pertinent test results  History of Anesthesia Complications Negative for: history of anesthetic complications  Airway Mallampati: III  TM Distance: >3 FB Neck ROM: limited    Dental  (+) Chipped, Poor Dentition, Missing, Edentulous Upper   Pulmonary neg shortness of breath, Current Smoker and Patient abstained from smoking.,    Pulmonary exam normal        Cardiovascular Exercise Tolerance: Good hypertension, (-) angina+ CAD, + Past MI and + Cardiac Stents  (-) DOE Normal cardiovascular exam     Neuro/Psych negative neurological ROS  negative psych ROS   GI/Hepatic negative GI ROS, Neg liver ROS, neg GERD  ,  Endo/Other  diabetes, Type 2  Renal/GU Renal disease     Musculoskeletal   Abdominal   Peds  Hematology negative hematology ROS (+)   Anesthesia Other Findings Past Medical History: No date: Abnormal chest CT     Comment:  a. 07/2012: scattered bilat noncalcified pulm nodules               ranging in size from a few mm to a max of 18mm in LLL -               rec f/u in 3-6 mos. No date: CAD (coronary artery disease)     Comment:  a. NSTEMI 10/04 => LHC: mLAD 99%=> Taxus DES to mLAD and              Taxus DES to dLAD;  b. ETT-Myoview 11/2011: normal, EF               73%, no ischemia; c. 07/2012 Cath/PCI: LM <10, LAD 90               ISRp/99 ISRd (3.0x38 Promus DES), LCX 36m, 30-40d, RCA               50-60p, 30d, EF 55-65%. No date: Diabetes mellitus No date: Dyslipidemia No date: ED (erectile dysfunction) No date: History of kidney stones No date: HTN (hypertension) No date: Myocardial infarction ([place]) No date: Obesity No date: Smoker     Comment:  CIGARS  Past Surgical History: 1976: APPENDECTOMY No date: arthroscopic knee  surgery; Right No date: CORONARY STENT PLACEMENT 07/11/2012: LEFT HEART CATHETERIZATION WITH CORONARY ANGIOGRAM; N/A     Comment:  Procedure: LEFT HEART CATHETERIZATION WITH CORONARY               ANGIOGRAM;  Surgeon: Peter M Martinique, MD;  Location: [facility]               CATH LAB;  Service: Cardiovascular;  Laterality: N/A; No date: right shoulder surgery     Reproductive/Obstetrics negative OB ROS                             Anesthesia Physical Anesthesia Plan  ASA: III  Anesthesia Plan: General ETT   Post-op Pain Management:    Induction: Intravenous  PONV Risk Score and Plan: Ondansetron, Dexamethasone, Midazolam and Treatment may vary due to age or medical condition  Airway Management Planned: Oral ETT  Additional Equipment:   Intra-op Plan:   Post-operative Plan: Extubation in OR  Informed Consent: I have reviewed the patients History and Physical, chart, labs and discussed the procedure including the  risks, benefits and alternatives for the proposed anesthesia with the patient or authorized representative who has indicated his/her understanding and acceptance.     Dental Advisory Given  Plan Discussed with: Anesthesiologist, CRNA and Surgeon  Anesthesia Plan Comments: (Patient consented for risks of anesthesia including but not limited to:  - adverse reactions to medications - damage to eyes, teeth, lips or other oral mucosa - nerve damage due to positioning  - sore throat or hoarseness - Damage to heart, brain, nerves, lungs, other parts of body or loss of life  Patient voiced understanding.)        Anesthesia Quick Evaluation

## 2019-08-04 NOTE — Anesthesia Procedure Notes (Signed)
Procedure Name: Intubation Performed by: Kelton Pillar, CRNA Pre-anesthesia Checklist: Patient identified, Emergency Drugs available, Suction available and Patient being monitored Patient Re-evaluated:Patient Re-evaluated prior to induction Oxygen Delivery Method: Circle system utilized Preoxygenation: Pre-oxygenation with 100% oxygen Induction Type: IV induction Ventilation: Mask ventilation without difficulty Laryngoscope Size: McGraph and 3 Grade View: Grade I Tube type: Oral Number of attempts: 1 Airway Equipment and Method: Stylet and Oral airway Placement Confirmation: ETT inserted through vocal cords under direct vision,  positive ETCO2,  breath sounds checked- equal and bilateral and CO2 detector Secured at: 22 cm Tube secured with: Tape Dental Injury: Teeth and Oropharynx as per pre-operative assessment

## 2019-08-04 NOTE — H&P (Signed)
   08/04/19 7:10 AM   Jerome Rodriguez [birth year] [id number]  CC: Left renal stone  HPI: Healthy 64 year old male who presented with microscopic hematuria and was found to have a 1.2 cm nonobstructing large left lower pole stone.  Cystoscopy to complete hematuria work-up was negative.  He opted for definitive management of the stone with ureteroscopy.  Stone density 1400HU, and not amenable to shockwave.   PMH: Past Medical History:  Diagnosis Date  . Abnormal chest CT    a. 07/2012: scattered bilat noncalcified pulm nodules ranging in size from a few mm to a max of 44mm in LLL - rec f/u in 3-6 mos.  Marland Kitchen CAD (coronary artery disease)    a. NSTEMI 10/04 => LHC: mLAD 99%=> Taxus DES to mLAD and Taxus DES to dLAD;  b. ETT-Myoview 11/2011: normal, EF 73%, no ischemia; c. 07/2012 Cath/PCI: LM <10, LAD 90 ISRp/99 ISRd (3.0x38 Promus DES), LCX 35m, 30-40d, RCA 50-60p, 30d, EF 55-65%.  . Diabetes mellitus   . Dyslipidemia   . ED (erectile dysfunction)   . History of kidney stones   . HTN (hypertension)   . Myocardial infarction ([place])   . Obesity   . Smoker    CIGARS    Surgical History: Past Surgical History:  Procedure Laterality Date  . APPENDECTOMY  1976  . arthroscopic knee surgery Right   . CORONARY STENT PLACEMENT    . LEFT HEART CATHETERIZATION WITH CORONARY ANGIOGRAM N/A 07/11/2012   Procedure: LEFT HEART CATHETERIZATION WITH CORONARY ANGIOGRAM;  Surgeon: Jerome M Martinique, MD;  Location[facility] CATH LAB;  Service: Cardiovascular;  Laterality: N/A;  . right shoulder surgery      Family History: Family History  Problem Relation Age of Onset  . Heart attack Mother        died in sleep  . Diabetic kidney disease Mother   . Diabetic kidney disease Father   . Heart attack Father   . Heart failure Father     Social History:  reports that he has been smoking cigars. He has never used smokeless tobacco. He reports that he does not drink alcohol and does not use drugs.  Physical  Exam: BP (!) 95/60   Pulse 80   Temp (!) 96.2 F (35.7 C) (Tympanic)   Resp 16   SpO2 99%    Constitutional:  Alert and oriented, No acute distress. Cardiovascular: Regular rate and rhythm Respiratory: Clear to auscultation bilaterally GI: Abdomen is soft, nontender, nondistended, no abdominal masses  Laboratory Data: Urine culture 7/19 with 1000 colonies staph epidermidis, contaminant  Pertinent Imaging: I have personally reviewed the CT showing a 1.2 cm nonobstructing large left lower pole stone  Assessment & Plan:   64 year old male with microscopic hematuria and a 1.2 cm large left lower pole stone who is opted for definitive management with ureteroscopy.  We specifically discussed the risks ureteroscopy including bleeding, infection/sepsis, stent related symptoms including flank pain/urgency/frequency/incontinence/dysuria, ureteral injury, inability to access stone, or need for staged or additional procedures.  Left ureteroscopy, laser lithotripsy, stent placement today   Jerome Madrid, MD 08/04/2019  [facility] [address] [place], [place] [address] [phone]

## 2019-08-04 NOTE — Telephone Encounter (Signed)
App changed [employer]

## 2019-08-05 ENCOUNTER — Encounter: Payer: Self-pay | Admitting: Urology

## 2019-08-08 MED FILL — CLOPIDOGREL 75 MG TABLET: 75 | 90 days supply | Qty: 90 | Fill #1

## 2019-08-11 ENCOUNTER — Telehealth: Payer: Self-pay

## 2019-08-11 NOTE — Telephone Encounter (Signed)
Pt calls and states that today he had a single episode of blood in the urine. Pt is 1 week post op. Pt denies fevers, chills, n/v, and pain. Upon inquiry pt states that yesterday he was much more active that he has been, he chopped down a tree in his yard, and noticed the blood after that. Advised pt on rest, decreasing strenuous activities, and increasing hydration. Advised pt to seek care in the ED for uncontrollable pain, fevers, or chills. Otherwise call office back on Monday if no improvement with rest. Pt gave verbal understanding.

## 2019-08-16 ENCOUNTER — Other Ambulatory Visit: Payer: Self-pay

## 2019-08-16 ENCOUNTER — Ambulatory Visit (INDEPENDENT_AMBULATORY_CARE_PROVIDER_SITE_OTHER): Payer: Medicare HMO | Admitting: Urology

## 2019-08-16 ENCOUNTER — Encounter: Payer: Self-pay | Admitting: Urology

## 2019-08-16 VITALS — BP 106/70 | HR 86 | Ht 68.0 in | Wt 204.6 lb

## 2019-08-16 DIAGNOSIS — Z466 Encounter for fitting and adjustment of urinary device: Secondary | ICD-10-CM

## 2019-08-16 DIAGNOSIS — N200 Calculus of kidney: Secondary | ICD-10-CM

## 2019-08-16 MED ORDER — LIDOCAINE HCL URETHRAL/MUCOSAL 2 % EX GEL
1.0000 "application " | Freq: Once | CUTANEOUS | Status: AC
  Administered 2019-08-16: 1 via URETHRAL

## 2019-08-16 NOTE — Progress Notes (Signed)
Cystoscopy Procedure Note:  Indication: Stent removal s/p 08/04/19 L URS/LL/stent for 1.2cm lower pole stone  After informed consent and discussion of the procedure and its risks, Jerome Rodriguez was positioned and prepped in the standard fashion. Cystoscopy was performed with a flexible cystoscope. The stent was grasped with flexible graspers and removed in its entirety. The patient tolerated the procedure well.  Findings: Uncomplicated stent removal  Assessment and Plan: RTC 1 year KUB  Billey Co, MD 08/16/2019

## 2019-08-16 NOTE — Patient Instructions (Addendum)
Ureteral Stent Implantation, Care After This sheet gives you information about how to care for yourself after your procedure. Your health care provider may also give you more specific instructions. If you have problems or questions, contact your health care provider. What can I expect after the procedure? After the procedure, it is common to have:  Nausea.  Mild pain when you urinate. You may feel this pain in your lower back or lower abdomen. The pain should stop within a few minutes after you urinate. This may last for up to 1 week.  A small amount of blood in your urine for several days. Follow these instructions at home: Medicines  Take over-the-counter and prescription medicines only as told by your health care provider.  If you were prescribed an antibiotic medicine, take it as told by your health care provider. Do not stop taking the antibiotic even if you start to feel better.  Do not drive for 24 hours if you were given a sedative during your procedure.  Ask your health care provider if the medicine prescribed to you requires you to avoid driving or using heavy machinery. Activity  Rest as told by your health care provider.  Avoid sitting for a long time without moving. Get up to take short walks every 1-2 hours. This is important to improve blood flow and breathing. Ask for help if you feel weak or unsteady.  Return to your normal activities as told by your health care provider. Ask your health care provider what activities are safe for you. General instructions   Watch for any blood in your urine. Call your health care provider if the amount of blood in your urine increases.  If you have a catheter: ? Follow instructions from your health care provider about taking care of your catheter and collection bag. ? Do not take baths, swim, or use a hot tub until your health care provider approves. Ask your health care provider if you may take showers. You may only be allowed to  take sponge baths.  Drink enough fluid to keep your urine pale yellow.  Do not use any products that contain nicotine or tobacco, such as cigarettes, e-cigarettes, and chewing tobacco. These can delay healing after surgery. If you need help quitting, ask your health care provider.  Keep all follow-up visits as told by your health care provider. This is important. Contact a health care provider if:  You have pain that gets worse or does not get better with medicine, especially pain when you urinate.  You have difficulty urinating.  You feel nauseous or you vomit repeatedly during a period of more than 2 days after the procedure. Get help right away if:  Your urine is dark red or has blood clots in it.  You are leaking urine (have incontinence).  The end of the stent comes out of your urethra.  You cannot urinate.  You have sudden, sharp, or severe pain in your abdomen or lower back.  You have a fever.  You have swelling or pain in your legs.  You have difficulty breathing. Summary  After the procedure, it is common to have mild pain when you urinate that goes away within a few minutes after you urinate. This may last for up to 1 week.  Watch for any blood in your urine. Call your health care provider if the amount of blood in your urine increases.  Take over-the-counter and prescription medicines only as told by your health care provider.  Drink   enough fluid to keep your urine pale yellow. This information is not intended to replace advice given to you by your health care provider. Make sure you discuss any questions you have with your health care provider. Document Revised: 09/28/2017 Document Reviewed: 09/29/2017 Elsevier Patient Education  2020 [employer].    Dietary Guidelines to Help Prevent Kidney Stones Kidney stones are deposits of minerals and salts that form inside your kidneys. Your risk of developing kidney stones may be greater depending on your diet,  your lifestyle, the medicines you take, and whether you have certain medical conditions. Most people can reduce their chances of developing kidney stones by following the instructions below. Depending on your overall health and the type of kidney stones you tend to develop, your dietitian may give you more specific instructions. What are tips for following this plan? Reading food labels  Choose foods with "no salt added" or "low-salt" labels. Limit your sodium intake to less than 1500 mg per day.  Choose foods with calcium for each meal and snack. Try to eat about 300 mg of calcium at each meal. Foods that contain 200-500 mg of calcium per serving include: ? 8 oz (237 ml) of milk, fortified nondairy milk, and fortified fruit juice. ? 8 oz (237 ml) of kefir, yogurt, and soy yogurt. ? 4 oz (118 ml) of tofu. ? 1 oz of cheese. ? 1 cup (300 g) of dried figs. ? 1 cup (91 g) of cooked broccoli. ? 1-3 oz can of sardines or mackerel.  Most people need 1000 to 1500 mg of calcium each day. Talk to your dietitian about how much calcium is recommended for you. Shopping  Buy plenty of fresh fruits and vegetables. Most people do not need to avoid fruits and vegetables, even if they contain nutrients that may contribute to kidney stones.  When shopping for convenience foods, choose: ? Whole pieces of fruit. ? Premade salads with dressing on the side. ? Low-fat fruit and yogurt smoothies.  Avoid buying frozen meals or prepared deli foods.  Look for foods with live cultures, such as yogurt and kefir. Cooking  Do not add salt to food when cooking. Place a salt shaker on the table and allow each person to add his or her own salt to taste.  Use vegetable protein, such as beans, textured vegetable protein (TVP), or tofu instead of meat in pasta, casseroles, and soups. Meal planning   Eat less salt, if told by your dietitian. To do this: ? Avoid eating processed or premade food. ? Avoid eating fast  food.  Eat less animal protein, including cheese, meat, poultry, or fish, if told by your dietitian. To do this: ? Limit the number of times you have meat, poultry, fish, or cheese each week. Eat a diet free of meat at least 2 days a week. ? Eat only one serving each day of meat, poultry, fish, or seafood. ? When you prepare animal protein, cut pieces into small portion sizes. For most meat and fish, one serving is about the size of one deck of cards.  Eat at least 5 servings of fresh fruits and vegetables each day. To do this: ? Keep fruits and vegetables on hand for snacks. ? Eat 1 piece of fruit or a handful of berries with breakfast. ? Have a salad and fruit at lunch. ? Have two kinds of vegetables at dinner.  Limit foods that are high in a substance called oxalate. These include: ? Spinach. ? Rhubarb. ?  Beets. ? Potato chips and french fries. ? Nuts.  If you regularly take a diuretic medicine, make sure to eat at least 1-2 fruits or vegetables high in potassium each day. These include: ? Avocado. ? Banana. ? Orange, prune, carrot, or tomato juice. ? Baked potato. ? Cabbage. ? Beans and split peas. General instructions   Drink enough fluid to keep your urine clear or pale yellow. This is the most important thing you can do.  Talk to your health care provider and dietitian about taking daily supplements. Depending on your health and the cause of your kidney stones, you may be advised: ? Not to take supplements with vitamin C. ? To take a calcium supplement. ? To take a daily probiotic supplement. ? To take other supplements such as magnesium, fish oil, or vitamin B6.  Take all medicines and supplements as told by your health care provider.  Limit alcohol intake to no more than 1 drink a day for nonpregnant women and 2 drinks a day for men. One drink equals 12 oz of beer, 5 oz of wine, or 1 oz of hard liquor.  Lose weight if told by your health care provider. Work with  your dietitian to find strategies and an eating plan that works best for you. What foods are not recommended? Limit your intake of the following foods, or as told by your dietitian. Talk to your dietitian about specific foods you should avoid based on the type of kidney stones and your overall health. Grains Breads. Bagels. Rolls. Baked goods. Salted crackers. Cereal. Pasta. Vegetables Spinach. Rhubarb. Beets. Canned vegetables. Angie Fava. Olives. Meats and other protein foods Nuts. Nut butters. Large portions of meat, poultry, or fish. Salted or cured meats. Deli meats. Hot dogs. Sausages. Dairy Cheese. Beverages Regular soft drinks. Regular vegetable juice. Seasonings and other foods Seasoning blends with salt. Salad dressings. Canned soups. Soy sauce. Ketchup. Barbecue sauce. Canned pasta sauce. Casseroles. Pizza. Lasagna. Frozen meals. Potato chips. [place] fries. Summary  You can reduce your risk of kidney stones by making changes to your diet.  The most important thing you can do is drink enough fluid. You should drink enough fluid to keep your urine clear or pale yellow.  Ask your health care provider or dietitian how much protein from animal sources you should eat each day, and also how much salt and calcium you should have each day. This information is not intended to replace advice given to you by your health care provider. Make sure you discuss any questions you have with your health care provider. Document Revised: 04/13/2018 Document Reviewed: 12/03/2015 Elsevier Patient Education  2020 [employer].

## 2019-08-17 ENCOUNTER — Telehealth (INDEPENDENT_AMBULATORY_CARE_PROVIDER_SITE_OTHER): Payer: Self-pay | Admitting: Otolaryngology

## 2019-08-17 LAB — URINALYSIS, COMPLETE
Bilirubin, UA: NEGATIVE
Ketones, UA: NEGATIVE
Leukocytes,UA: NEGATIVE
Nitrite, UA: NEGATIVE
Specific Gravity, UA: 1.02 (ref 1.005–1.030)
Urobilinogen, Ur: 0.2 mg/dL (ref 0.2–1.0)
pH, UA: 6 (ref 5.0–7.5)

## 2019-08-17 LAB — MICROSCOPIC EXAMINATION
Bacteria, UA: NONE SEEN
RBC, Urine: >30 /hpf — AB (ref 0–2)

## 2019-08-17 NOTE — Telephone Encounter (Signed)
Retturned patient's call concerning results of his MRI for evaluation of left ear hearing loss.  The MRI demonstrated no cochlear or retrocochlear pathology which would account for the hearing loss.  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  I gave him the number for [facility] to call concerning possible hearing aid.  He is medically cleared for hearing aid use.

## 2019-09-01 ENCOUNTER — Telehealth: Payer: Self-pay

## 2019-09-01 MED FILL — FARXIGA 5 MG TABLET: 5 | 30 days supply | Qty: 30 | Fill #1

## 2019-09-01 NOTE — Telephone Encounter (Signed)
Pt. Called stating that he wanted to know if you could change his [place] to [place] or Invokana per the pharmacy they would be cheaper than his farxiga. He uses [facility]acy.

## 2019-09-04 MED ORDER — EMPAGLIFLOZIN 25 MG PO TABS: 25.0000 mg | ORAL_TABLET | Freq: Every day | ORAL | 1 refills | Status: DC

## 2019-09-14 ENCOUNTER — Other Ambulatory Visit: Payer: Self-pay | Admitting: Family Medicine

## 2019-09-14 DIAGNOSIS — E785 Hyperlipidemia, unspecified: Secondary | ICD-10-CM

## 2019-09-14 MED FILL — ATORVASTATIN 20 MG TABLET: 20 | 90 days supply | Qty: 90 | Fill #0

## 2019-10-02 MED FILL — ENALAPRIL MALEATE 2.5 MG TA: 2.5 | 90 days supply | Qty: 90 | Fill #1

## 2019-10-02 MED FILL — METOPROLOL SUCCINATE ER 25: 25 | 90 days supply | Qty: 90 | Fill #1

## 2019-10-03 ENCOUNTER — Telehealth: Payer: Self-pay

## 2019-10-03 ENCOUNTER — Other Ambulatory Visit: Payer: Self-pay | Admitting: Family Medicine

## 2019-10-03 MED ORDER — CANAGLIFLOZIN 300 MG PO TABS
300.0000 mg | ORAL_TABLET | Freq: Every day | ORAL | 5 refills | Status: DC
Start: 2019-10-03 — End: 2020-03-28

## 2019-10-03 MED FILL — INVOKANA 300 MG TABLET: 300 | 30 days supply | Qty: 30 | Fill #0

## 2019-10-03 NOTE — Telephone Encounter (Signed)
The record says Jardiance.  Find out exactly the deal here.

## 2019-10-03 NOTE — Telephone Encounter (Signed)
Apparently Invokana is the cheapest SGLT2 available.

## 2019-10-03 NOTE — Telephone Encounter (Signed)
Pharmacy called to advised farxiga is to expensive. We have been advised that invokana was more affordable. Please advise [facility]

## 2019-10-03 NOTE — Telephone Encounter (Signed)
Pt. Called to see if you could send in the invokana for him today because he is out of farxiga as of today and it and Jardiance are both too expensive the pharmacists told him Jerome Rodriguez would be the most affordable for him. That needs to be sent to [facility].

## 2019-10-05 ENCOUNTER — Ambulatory Visit (INDEPENDENT_AMBULATORY_CARE_PROVIDER_SITE_OTHER): Payer: Medicare HMO | Admitting: Family Medicine

## 2019-10-05 ENCOUNTER — Other Ambulatory Visit: Payer: Self-pay

## 2019-10-05 ENCOUNTER — Encounter: Payer: Self-pay | Admitting: Family Medicine

## 2019-10-05 VITALS — BP 102/68 | HR 82 | Temp 96.3°F | Wt 201.6 lb

## 2019-10-05 DIAGNOSIS — N200 Calculus of kidney: Secondary | ICD-10-CM | POA: Diagnosis not present

## 2019-10-05 DIAGNOSIS — E785 Hyperlipidemia, unspecified: Secondary | ICD-10-CM

## 2019-10-05 DIAGNOSIS — E118 Type 2 diabetes mellitus with unspecified complications: Secondary | ICD-10-CM

## 2019-10-05 DIAGNOSIS — E669 Obesity, unspecified: Secondary | ICD-10-CM | POA: Diagnosis not present

## 2019-10-05 DIAGNOSIS — I2510 Atherosclerotic heart disease of native coronary artery without angina pectoris: Secondary | ICD-10-CM | POA: Diagnosis not present

## 2019-10-05 DIAGNOSIS — E1169 Type 2 diabetes mellitus with other specified complication: Secondary | ICD-10-CM | POA: Diagnosis not present

## 2019-10-05 LAB — POCT GLYCOSYLATED HEMOGLOBIN (HGB A1C): Hemoglobin A1C: 7.4 % — AB (ref 4.0–5.6)

## 2019-10-05 NOTE — Progress Notes (Signed)
  Subjective:    Patient ID: Jerome Rodriguez, male    DOB: 05-20-1955, 64 y.o.   MRN: [id number]  Jerome Rodriguez is a 64 y.o. male who presents for follow-up of Type 2 diabetes mellitus.  Home blood sugar records: meter records, 103-158, fasting and post meal  Current symptoms/problems include none at this time. Daily foot checks:yes  Any foot concerns: none Exercise: walking one hour Diet: good He was recently evaluated for hematuria.  He was found to have stones.  He did have lithotripsy on that.  He is doing well.  Presently he is taking Invokana and Metformin for his diabetes.  He is now starting to walk more.  He continues on Vasotec as well as metoprolol and Plavix.  He is not using his nitroglycerin.  He has not had any chest pain, shortness of breath or PND.  He is not interested in having the flu shot.  He is starting to walk more.  The following portions of the patient's history were reviewed and updated as appropriate: allergies, current medications, past medical history, past social history and problem list.  ROS as in subjective above.     Objective:    Physical Exam Alert and in no distress otherwise not examined.  Hemoglobin A1c is 7.4   Lab Review Diabetic Labs Latest Ref Rng & Units 10/05/2019 05/30/2019 12/22/2018 06/27/2018 02/22/2018  HbA1c 4.0 - 5.6 % 7.4(A) 11.8(A) 8.2(A) 8.4(A) 8.4(A)  Microalbumin mg/L - - - - 11.0  Micro/Creat Ratio 0 - 29 mg/g creat - 219(H) - - 9.4  Chol 100 - 199 mg/dL - 126 - - 163  HDL >39 mg/dL - 39(L) - - 41  Calc LDL 0 - 99 mg/dL - 67 - - 87  Triglycerides 0 - 149 mg/dL - 105 - - 173(H)  Creatinine 0.76 - 1.27 mg/dL - 1.11 - - 0.99   BP/Weight 10/05/2019 08/16/2019 08/04/2019 07/28/2019 05/13/3265  Systolic BP 124 580 98 - 998  Diastolic BP 68 70 53 - 69  Wt. (Lbs) 201.6 204.6 - 204 203.93  BMI 30.65 31.11 - 31.48 31.94   Foot/eye exam completion dates 05/30/2019 02/22/2018  Eye Exam - -  Foot Form Completion Done Done     Jerome Rodriguez  reports that he has been smoking cigars. He has never used smokeless tobacco. He reports that he does not drink alcohol and does not use drugs.     Assessment & Plan:    Type 2 diabetes with complication (HCC) - Plan: POCT glycosylated hemoglobin (Hb A1C)  Obesity (BMI 30-39.9)  ASHD (arteriosclerotic heart disease)  Hyperlipidemia associated with type 2 diabetes mellitus ([place])  Renal stones   1. Rx changes: none 2. Education: Reviewed 'ABCs' of diabetes management (respective goals in parentheses):  A1C (<7), blood pressure (<130/80), and cholesterol (LDL <100). 3. Compliance at present is estimated to be good. Efforts to improve compliance (if necessary) will be directed at increased exercise. 4. Follow up: 4 months

## 2019-10-10 MED FILL — AMOXICILLIN 875 MG TABS: 875 | 5 days supply | Qty: 10 | Fill #0

## 2019-10-10 MED FILL — HYDROCODON-APAP 7.5-325: 7.5-325 | 1 days supply | Qty: 6 | Fill #0

## 2019-10-19 ENCOUNTER — Ambulatory Visit (INDEPENDENT_AMBULATORY_CARE_PROVIDER_SITE_OTHER): Payer: Medicare HMO

## 2019-10-19 ENCOUNTER — Other Ambulatory Visit: Payer: Self-pay

## 2019-10-19 DIAGNOSIS — Z23 Encounter for immunization: Secondary | ICD-10-CM

## 2019-10-31 MED FILL — INVOKANA 300 MG TABLET: 300 | 30 days supply | Qty: 30 | Fill #1

## 2019-10-31 MED FILL — CLOPIDOGREL 75 MG TABLET: 75 | 90 days supply | Qty: 90 | Fill #2

## 2019-11-22 ENCOUNTER — Encounter: Payer: Self-pay | Admitting: Urology

## 2019-11-22 ENCOUNTER — Other Ambulatory Visit: Payer: Self-pay

## 2019-11-22 ENCOUNTER — Other Ambulatory Visit: Payer: Self-pay | Admitting: Urology

## 2019-11-22 ENCOUNTER — Ambulatory Visit: Payer: Medicare HMO | Admitting: Urology

## 2019-11-22 VITALS — BP 112/62 | HR 101 | Ht 68.0 in | Wt 203.0 lb

## 2019-11-22 DIAGNOSIS — R972 Elevated prostate specific antigen [PSA]: Secondary | ICD-10-CM

## 2019-11-22 DIAGNOSIS — Z87448 Personal history of other diseases of urinary system: Secondary | ICD-10-CM | POA: Diagnosis not present

## 2019-11-22 DIAGNOSIS — Z125 Encounter for screening for malignant neoplasm of prostate: Secondary | ICD-10-CM | POA: Diagnosis not present

## 2019-11-22 DIAGNOSIS — N410 Acute prostatitis: Secondary | ICD-10-CM | POA: Diagnosis not present

## 2019-11-22 DIAGNOSIS — N3281 Overactive bladder: Secondary | ICD-10-CM | POA: Diagnosis not present

## 2019-11-22 MED ORDER — SULFAMETHOXAZOLE-TRIMETHOPRIM 800-160 MG PO TABS
1.0000 | ORAL_TABLET | Freq: Two times a day (BID) | ORAL | 0 refills | Status: DC
Start: 2019-11-22 — End: 2019-12-27

## 2019-11-22 MED FILL — SULFAMETHOXAZOLE-TMP DS TAB: 800-160 | 14 days supply | Qty: 28 | Fill #0

## 2019-11-22 NOTE — Progress Notes (Signed)
   11/22/2019 4:51 PM   Tery Hoeger Al-Rammal 1955/07/23 [id number]  Reason for visit: hematospermia, urinary urgency, pelvic discomfort  HPI: I saw [name] in clinic today for the above issues.  He is a 64 year old male who presented with microscopic hematuria this summer and was found to have a 1 cm left lower pole stone and underwent uncomplicated cystoscopy, left ureteroscopy and laser lithotripsy on 08/04/2019.  Cystoscopy was normal at that time, and there were no other abnormalities on CT urogram.  His stent was removed on 08/16/2019.  He reports that over the last 4 to 6 weeks he has had some worsening urinary urgency and occasional leakage if he cannot make it to the restroom in time.  He also noticed some reddish fluid in his underwear when he woke up, and felt like this was semen that was tinted red.  He has not had any gross hematuria or flank pain.  He is also had some discomfort behind the scrotum, and feels like he has to help push the urine out.  Urinalysis today is benign with 0-5 WBCs, 0-2 RBCs, no bacteria, nitrite negative, no leukocytes.  DRE with firm prostate, 40 g, no masses or nodules.  Patent meatus, testicles 20 cc and descended bilaterally and nontender.  We discussed possible etiologies of his symptoms including prostatitis, pelvic floor dysfunction, obstructive fragments, overactive bladder, or poorly controlled diabetes.  His symptoms are very consistent with a possible prostatitis, and I recommended trying a 2-week course of Bactrim.  We also discussed the need to check a PSA, as well as the controversy surrounding PSA screening.  Bactrim DS x2 weeks Call with PSA results Virtual visit 2 to 3 weeks for symptom check    Billey Co, MD  [facility] [address] [place] [employer], [place] [address] [phone]

## 2019-11-22 NOTE — Patient Instructions (Signed)

## 2019-11-23 LAB — MICROSCOPIC EXAMINATION: Bacteria, UA: NONE SEEN

## 2019-11-23 LAB — URINALYSIS, COMPLETE
Bilirubin, UA: NEGATIVE
Ketones, UA: NEGATIVE
Leukocytes,UA: NEGATIVE
Nitrite, UA: NEGATIVE
Protein,UA: NEGATIVE
Specific Gravity, UA: 1.015 (ref 1.005–1.030)
Urobilinogen, Ur: 0.2 mg/dL (ref 0.2–1.0)
pH, UA: 5.5 (ref 5.0–7.5)

## 2019-11-23 LAB — PSA TOTAL (REFLEX TO FREE): Prostate Specific Ag, Serum: 0.2 ng/mL (ref 0.0–4.0)

## 2019-11-28 MED FILL — INVOKANA 300 MG TABLET: 300 | 30 days supply | Qty: 30 | Fill #2

## 2019-12-12 ENCOUNTER — Other Ambulatory Visit: Payer: Self-pay

## 2019-12-12 ENCOUNTER — Other Ambulatory Visit: Payer: Self-pay | Admitting: Family Medicine

## 2019-12-12 ENCOUNTER — Telehealth (INDEPENDENT_AMBULATORY_CARE_PROVIDER_SITE_OTHER): Payer: Medicare HMO | Admitting: Urology

## 2019-12-12 DIAGNOSIS — N401 Enlarged prostate with lower urinary tract symptoms: Secondary | ICD-10-CM

## 2019-12-12 DIAGNOSIS — N138 Other obstructive and reflux uropathy: Secondary | ICD-10-CM

## 2019-12-12 DIAGNOSIS — N3281 Overactive bladder: Secondary | ICD-10-CM | POA: Diagnosis not present

## 2019-12-12 DIAGNOSIS — R102 Pelvic and perineal pain: Secondary | ICD-10-CM

## 2019-12-12 DIAGNOSIS — E1169 Type 2 diabetes mellitus with other specified complication: Secondary | ICD-10-CM

## 2019-12-12 MED FILL — ATORVASTATIN CALCIUM 20 MG: 20 | 90 days supply | Qty: 90 | Fill #0

## 2019-12-12 NOTE — Progress Notes (Signed)
Virtual Visit via Telephone Note  I connected with Jerome Rodriguez on 12/12/19 at 11:15 AM EST by telephone and verified that I am speaking with the correct person using two identifiers.   Patient location: Home Provider location: [facility]   I discussed the limitations, risks, security and privacy concerns of performing an evaluation and management service by telephone and the availability of in person appointments. We discussed the impact of the COVID-19 pandemic on the healthcare system, and the importance of social distancing and reducing patient and provider exposure. I also discussed with the patient that there may be a patient responsible charge related to this service. The patient expressed understanding and agreed to proceed.  Reason for visit: Urinary symptoms, hematospermia  History of Present Illness: 64 year old male who underwent left ureteroscopy and laser lithotripsy for a 1.2 cm left lower pole stone in July 2021. Cystoscopy was normal at that time. He has developed 6 to 8 weeks of urinary symptoms including frequency, urgency, and possible hematospermia. I previously tried a course of Bactrim which resolved some of his pelvic discomfort and pain, but he still has persistent urgency and frequency. He thinks he can still see some blood in his semen. PSA was normal at our last visit at 0.2.  We again reviewed possible etiologies including infection, inflammation, pelvic floor dysfunction, obstructive fragments, overactive bladder, or his diabetes. I recommended 2 weeks of NSAIDs with close follow-up in 1 to 2 weeks for cystoscopy to rule out urethral stricture after his procedure this summer. He is in agreement with this plan.  Follow Up: NSAIDs x2 weeks RTC 2 weeks for clinic cystoscopy   I discussed the assessment and treatment plan with the patient. The patient was provided an opportunity to ask questions and all were answered. The patient agreed with the  plan and demonstrated an understanding of the instructions.   The patient was advised to call back or seek an in-person evaluation if the symptoms worsen or if the condition fails to improve as anticipated.  I provided 8 minutes of non-face-to-face time during this encounter.   Billey Co, MD

## 2019-12-18 DIAGNOSIS — H40013 Open angle with borderline findings, low risk, bilateral: Secondary | ICD-10-CM | POA: Diagnosis not present

## 2019-12-18 DIAGNOSIS — H40053 Ocular hypertension, bilateral: Secondary | ICD-10-CM | POA: Diagnosis not present

## 2019-12-27 ENCOUNTER — Other Ambulatory Visit: Payer: Self-pay

## 2019-12-27 ENCOUNTER — Ambulatory Visit: Payer: Medicare HMO | Admitting: Urology

## 2019-12-27 ENCOUNTER — Encounter: Payer: Self-pay | Admitting: Urology

## 2019-12-27 VITALS — BP 118/69 | HR 91 | Ht 67.0 in | Wt 202.0 lb

## 2019-12-27 DIAGNOSIS — N401 Enlarged prostate with lower urinary tract symptoms: Secondary | ICD-10-CM | POA: Diagnosis not present

## 2019-12-27 DIAGNOSIS — Z87448 Personal history of other diseases of urinary system: Secondary | ICD-10-CM | POA: Diagnosis not present

## 2019-12-27 DIAGNOSIS — N410 Acute prostatitis: Secondary | ICD-10-CM

## 2019-12-27 DIAGNOSIS — N138 Other obstructive and reflux uropathy: Secondary | ICD-10-CM

## 2019-12-27 LAB — URINALYSIS, COMPLETE
Bilirubin, UA: NEGATIVE
Ketones, UA: NEGATIVE
Leukocytes,UA: NEGATIVE
Nitrite, UA: NEGATIVE
Protein,UA: NEGATIVE
RBC, UA: NEGATIVE
Specific Gravity, UA: 1.01 (ref 1.005–1.030)
Urobilinogen, Ur: 0.2 mg/dL (ref 0.2–1.0)
pH, UA: 5 (ref 5.0–7.5)

## 2019-12-27 LAB — MICROSCOPIC EXAMINATION
Bacteria, UA: NONE SEEN
Epithelial Cells (non renal): NONE SEEN /hpf (ref 0–10)

## 2019-12-27 NOTE — Progress Notes (Signed)
Cystoscopy Procedure Note:  Indication: Urinary symptoms, hematospermia  After informed consent and discussion of the procedure and its risks, Clarke Peretz Al-Rammal was positioned and prepped in the standard fashion. Cystoscopy was performed with a flexible cystoscope. The urethra, bladder neck and entire bladder was visualized in a standard fashion. The prostate was moderate with lateral lobe hypertrophy but no median lobe. The ureteral orifices were visualized in their normal location and orientation.  No abnormalities on retroflexion  Findings: Normal cystoscopy  Assessment and Plan: His symptoms have almost completely resolved after a course of Bactrim and NSAIDs.  Suspect he had a bout with prostatitis causing his urinary urgency/frequency and pelvic discomfort, as well as hematospermia.  He really denies any significant urinary symptoms at this time and urgency has almost completely resolved.  RTC 3 months symptom check Consider OAB med in the future if worsening urinary symptoms  [name], MD 12/27/2019

## 2019-12-27 NOTE — Patient Instructions (Signed)

## 2020-01-01 MED FILL — METOPROLOL SUCCINATE ER 25: 25 | 90 days supply | Qty: 90 | Fill #2

## 2020-01-01 MED FILL — INVOKANA 300 MG TABLET: 300 | 30 days supply | Qty: 30 | Fill #3

## 2020-01-01 MED FILL — ENALAPRIL MALEATE 2.5 MG TA: 2.5 | 90 days supply | Qty: 90 | Fill #2

## 2020-01-05 ENCOUNTER — Other Ambulatory Visit: Payer: Self-pay | Admitting: Family Medicine

## 2020-01-05 DIAGNOSIS — E118 Type 2 diabetes mellitus with unspecified complications: Secondary | ICD-10-CM

## 2020-01-29 MED FILL — INVOKANA 300 MG TABLET: 300 | 30 days supply | Qty: 30 | Fill #4

## 2020-01-29 MED FILL — CLOPIDOGREL 75 MG TABLET: 75 | 90 days supply | Qty: 90 | Fill #3

## 2020-02-05 ENCOUNTER — Ambulatory Visit (INDEPENDENT_AMBULATORY_CARE_PROVIDER_SITE_OTHER): Payer: PPO | Admitting: Family Medicine

## 2020-02-05 ENCOUNTER — Encounter: Payer: Self-pay | Admitting: Family Medicine

## 2020-02-05 ENCOUNTER — Ambulatory Visit
Admission: RE | Admit: 2020-02-05 | Discharge: 2020-02-05 | Disposition: A | Discharge status: Home / Self Care | Payer: PPO | Source: Ambulatory Visit | Attending: Family Medicine | Admitting: Family Medicine

## 2020-02-05 ENCOUNTER — Other Ambulatory Visit: Payer: Self-pay

## 2020-02-05 VITALS — BP 92/66 | HR 80 | Temp 97.5°F | Wt 205.0 lb

## 2020-02-05 DIAGNOSIS — E1169 Type 2 diabetes mellitus with other specified complication: Secondary | ICD-10-CM

## 2020-02-05 DIAGNOSIS — E785 Hyperlipidemia, unspecified: Secondary | ICD-10-CM

## 2020-02-05 DIAGNOSIS — I152 Hypertension secondary to endocrine disorders: Secondary | ICD-10-CM | POA: Diagnosis not present

## 2020-02-05 DIAGNOSIS — E669 Obesity, unspecified: Secondary | ICD-10-CM

## 2020-02-05 DIAGNOSIS — E118 Type 2 diabetes mellitus with unspecified complications: Secondary | ICD-10-CM

## 2020-02-05 DIAGNOSIS — I700 Atherosclerosis of aorta: Secondary | ICD-10-CM | POA: Diagnosis not present

## 2020-02-05 DIAGNOSIS — M25512 Pain in left shoulder: Secondary | ICD-10-CM | POA: Diagnosis not present

## 2020-02-05 DIAGNOSIS — G8929 Other chronic pain: Secondary | ICD-10-CM

## 2020-02-05 DIAGNOSIS — E1159 Type 2 diabetes mellitus with other circulatory complications: Secondary | ICD-10-CM | POA: Diagnosis not present

## 2020-02-05 LAB — POCT GLYCOSYLATED HEMOGLOBIN (HGB A1C): Hemoglobin A1C: 7.1 % — AB (ref 4.0–5.6)

## 2020-02-05 NOTE — Patient Instructions (Signed)
Make sure you eat 4 meals per day

## 2020-02-05 NOTE — Progress Notes (Addendum)
Subjective:    Patient ID: Jerome Rodriguez, male    DOB: [birth year], 65 y.o.   MRN: [id number]  Jerome Rodriguez is a 65 y.o. male who presents for follow-up of Type 2 diabetes mellitus.  Home blood sugar records: meter records, fasting only, 86 - 151. Current symptoms/problems include none at this time. Daily foot checks:yes   Any foot concerns: none Exercise: staying active 20 min Diet: good.  He does admit to having cut back on some of his food intake especially at night and did wake up middle night sweating. He is in the process of trying to get his weight down to 180 pounds.  He continues on Metformin and Invokana for his diabetes, atorvastatin and having no difficulty with that.  He is also taking enalapril and metoprolol.  His weight is down slightly.  He also complains of 1 year history of left shoulder pain.  He has difficulty with abduction as well as internal and external rotation.  Also complains of some night pain with that. The following portions of the patient's history were reviewed and updated as appropriate: allergies, current medications, past medical history, past social history and problem list.  ROS as in subjective above.     Objective:    Physical Exam Alert and in no distress exam of the left shoulder does show some tenderness to palpation over the [facility] joint.  Bicipital groove is normal.  He has limitation of abduction as well as external and internal rotation.  Difficult to do Neer's and Hawkins test because of this. Lab Review Diabetic Labs Latest Ref Rng & Units 10/05/2019 05/30/2019 12/22/2018 06/27/2018 02/22/2018  HbA1c 4.0 - 5.6 % 7.4(A) 11.8(A) 8.2(A) 8.4(A) 8.4(A)  Microalbumin mg/L - - - - 11.0  Micro/Creat Ratio 0 - 29 mg/g creat - 219(H) - - 9.4  Chol 100 - 199 mg/dL - 126 - - 163  HDL >39 mg/dL - 39(L) - - 41  Calc LDL 0 - 99 mg/dL - 67 - - 87  Triglycerides 0 - 149 mg/dL - 105 - - 173(H)  Creatinine 0.76 - 1.27 mg/dL - 1.11 - - 0.99    BP/Weight 12/27/2019 11/22/2019 10/05/2019 08/16/2019 07/30/3662  Systolic BP 403 474 259 563 98  Diastolic BP 69 62 68 70 53  Wt. (Lbs) 202 203 201.6 204.6 -  BMI 31.64 30.87 30.65 31.11 -   Foot/eye exam completion dates 05/30/2019 02/22/2018  Eye Exam - -  Foot Form Completion Done Done  Hemoglobin A1c is 7.1  Matt  reports that he has been smoking cigars. He has never used smokeless tobacco. He reports that he does not drink alcohol and does not use drugs.     Assessment & Plan:    Type 2 diabetes with complication ([place]) - Plan: POCT glycosylated hemoglobin (Hb A1C)  Chronic left shoulder pain - Plan: DG Shoulder Left  Obesity (BMI 30-39.9)  Hyperlipidemia associated with type 2 diabetes mellitus ([place])  Hypertension associated with diabetes ([place])    1. Rx changes: none 2. Education: Reviewed 'ABCs' of diabetes management (respective goals in parentheses):  A1C (<7), blood pressure (<130/80), and cholesterol (LDL <100). 3. Compliance at present is estimated to be good. Efforts to improve compliance (if necessary) will be directed at Discussed the need for him to eat 4 meals per day and not skip them.  Explained that his sweating was due to low blood sugar.. 4. Follow up: 4 months I explained that I thought his left shoulder pain  would require more work as I think he probably has some adhesive capsulitis as well as some AC joint arthritis. Definitely encouraged him to continue with his diet and exercise. Aortic atherosclerosis seen on recent x-ray.

## 2020-02-08 ENCOUNTER — Encounter: Payer: Self-pay | Admitting: Physician Assistant

## 2020-02-08 ENCOUNTER — Ambulatory Visit: Payer: PPO | Admitting: Physician Assistant

## 2020-02-08 DIAGNOSIS — G8929 Other chronic pain: Secondary | ICD-10-CM | POA: Diagnosis not present

## 2020-02-08 DIAGNOSIS — M25512 Pain in left shoulder: Secondary | ICD-10-CM | POA: Diagnosis not present

## 2020-02-08 NOTE — Progress Notes (Signed)
Office Visit Note   Patient: Jerome Rodriguez           Date of Birth: [birth year]           MRN: [id number] Visit Date: 02/08/2020              Requested by: Denita Lung, MD [place],  [place] [address] PCP: Denita Lung, MD   Assessment & Plan: Visit Diagnoses:  1. Chronic left shoulder pain     Plan: Due to the fact the patient has weakness on exam, he is tried a home exercise program which consisted of bands and exercises taught after his right shoulder rotator cuff repair and given the year-long pain in the shoulder recommend MRI.  MRI of the left shoulder is to rule out rotator cuff tear.  He will follow-up after the MRI to go over results and discuss further treatment.  Questions were encouraged and answered at length.  Follow-Up Instructions: Return After MRI.   Orders:  No orders of the defined types were placed in this encounter.  No orders of the defined types were placed in this encounter.     Procedures: No procedures performed   Clinical Data: No additional findings.   Subjective: Chief Complaint  Patient presents with  . Left Shoulder - Pain    HPI [name] is a 65 year old male who comes in today with left shoulder pain.  He states his left shoulder pain is been ongoing for the past year.  No injury.  States that is left shoulder feels like his right shoulder did whenever he had a rotator cuff repair and he underwent a cuff repair in  2004 2005.  He reports that his pain started whenever he picked up a backpack and felt pain in the shoulder.  Since then he has had decreased range of motion pain he is tried a home exercise program which included bands as taught by therapy after his right shoulder rotator cuff repair.  Also tried ice heat.  He denies any real numbness tingling down the arm.  He occasionally has some numbness tingling in the arm whenever he awakens but he sleeps on the left arm.  Patient is diabetic  reports his last hemoglobin A1c is 7.1. Radiographs left shoulder 3 views: Shows no acute fractures no bony abnormalities.  No significant arthritic changes.  Glenohumeral joint appears well maintained.  Review of Systems See HPI otherwise negative  Objective: Vital Signs: There were no vitals taken for this visit.  Physical Exam Pulmonary:     Effort: Pulmonary effort is normal.  Neurological:     Mental Status: He is alert and oriented to person, place, and time.  Psychiatric:        Mood and Affect: Mood normal.        Behavior: Behavior normal.     Ortho Exam Bilateral shoulders he has 5-5 strength with internal rotation against resistance.  4-5 strength on the left with external rotation.  Positive empty can test on the left negative on the right.  Liftoff test is negative bilaterally.  Positive impingement on the left.  Forward flexion he can bring the arm up to approximately 160 degrees actively passively with pain I can bring 290 degrees. Specialty Comments:  No specialty comments available.  Imaging: No results found.   PMFS History: Patient Active Problem List   Diagnosis Date Noted  . Renal stones 10/05/2019  . Diabetic nephropathy with proteinuria (  [place]) 05/31/2019  . Positive PPD, treated 07/15/2012  . Tobacco abuse 07/10/2012  . Abnormal chest x-ray 07/10/2012  . Obesity (BMI 30-39.9) 07/16/2011  . ASHD (arteriosclerotic heart disease) 06/24/2010  . Type 2 diabetes with complication ([place]) 81/19/1478  . ED (erectile dysfunction) 06/24/2010  . Hyperlipidemia associated with type 2 diabetes mellitus ([place]) 04/03/2008  . Hypertension associated with diabetes ([place]) 04/03/2008   Past Medical History:  Diagnosis Date  . Abnormal chest CT    a. 07/2012: scattered bilat noncalcified pulm nodules ranging in size from a few mm to a max of 27mm in LLL - rec f/u in 3-6 mos.  Marland Kitchen CAD (coronary artery disease)    a. NSTEMI 10/04 => LHC: mLAD 99%=> Taxus DES to mLAD and Taxus  DES to dLAD;  b. ETT-Myoview 11/2011: normal, EF 73%, no ischemia; c. 07/2012 Cath/PCI: LM <10, LAD 90 ISRp/99 ISRd (3.0x38 Promus DES), LCX 93m, 30-40d, RCA 50-60p, 30d, EF 55-65%.  . Diabetes mellitus   . Dyslipidemia   . ED (erectile dysfunction)   . History of kidney stones   . HTN (hypertension)   . Kidney stone   . Myocardial infarction ([place])   . Obesity   . Smoker    CIGARS    Family History  Problem Relation Age of Onset  . Heart attack Mother        died in sleep  . Diabetic kidney disease Mother   . Diabetic kidney disease Father   . Heart attack Father   . Heart failure Father     Past Surgical History:  Procedure Laterality Date  . APPENDECTOMY  1976  . arthroscopic knee surgery Right   . CORONARY STENT PLACEMENT    . CYSTOSCOPY/URETEROSCOPY/HOLMIUM LASER/STENT PLACEMENT Left 08/04/2019   Procedure: CYSTOSCOPY/URETEROSCOPY/HOLMIUM LASER/STENT PLACEMENT;  Surgeon: Billey Co, MD;  Location: [facility] ORS;  Service: Urology;  Laterality: Left;  . LEFT HEART CATHETERIZATION WITH CORONARY ANGIOGRAM N/A 07/11/2012   Procedure: LEFT HEART CATHETERIZATION WITH CORONARY ANGIOGRAM;  Surgeon: Peter M Martinique, MD;  Location: [facility] CATH LAB;  Service: Cardiovascular;  Laterality: N/A;  . right shoulder surgery     Social History   Occupational History  . Occupation: taxi Geophysicist/field seismologist  Tobacco Use  . Smoking status: Light Tobacco Smoker    Types: Cigars  . Smokeless tobacco: Never Used  . Tobacco comment: smoke 1--d for 25 years; quit 6 months ago. is now smoking ciagrs occasionally   Vaping Use  . Vaping Use: Never used  Substance and Sexual Activity  . Alcohol use: No    Comment: rare   . Drug use: No  . Sexual activity: Yes    Birth control/protection: None

## 2020-02-12 NOTE — Addendum Note (Signed)
Addended by: Robyne Peers on: 02/12/2020 08:22 AM   Modules accepted: Orders

## 2020-02-28 ENCOUNTER — Telehealth: Payer: Self-pay | Admitting: Physician Assistant

## 2020-02-28 MED FILL — INVOKANA 300 MG TABLET: 300 | 30 days supply | Qty: 30 | Fill #5

## 2020-02-28 NOTE — Telephone Encounter (Signed)
Called patient left message to return call to schedule an MRI appointment with Artis Delay. MRI is on 03/01/2020

## 2020-03-01 ENCOUNTER — Ambulatory Visit
Admission: RE | Admit: 2020-03-01 | Discharge: 2020-03-01 | Disposition: A | Discharge status: Home / Self Care | Payer: PPO | Source: Ambulatory Visit | Attending: Physician Assistant | Admitting: Physician Assistant

## 2020-03-01 ENCOUNTER — Other Ambulatory Visit: Payer: Self-pay

## 2020-03-01 DIAGNOSIS — G8929 Other chronic pain: Secondary | ICD-10-CM

## 2020-03-01 DIAGNOSIS — M75122 Complete rotator cuff tear or rupture of left shoulder, not specified as traumatic: Secondary | ICD-10-CM | POA: Diagnosis not present

## 2020-03-13 ENCOUNTER — Ambulatory Visit: Payer: PPO | Admitting: Orthopaedic Surgery

## 2020-03-14 ENCOUNTER — Other Ambulatory Visit: Payer: Self-pay | Admitting: Family Medicine

## 2020-03-14 DIAGNOSIS — E785 Hyperlipidemia, unspecified: Secondary | ICD-10-CM

## 2020-03-14 DIAGNOSIS — E1169 Type 2 diabetes mellitus with other specified complication: Secondary | ICD-10-CM

## 2020-03-15 MED FILL — ATORVASTATIN CALCIUM 20 MG: 20 | 90 days supply | Qty: 90 | Fill #0

## 2020-03-27 ENCOUNTER — Ambulatory Visit: Payer: PPO | Admitting: Urology

## 2020-03-27 ENCOUNTER — Other Ambulatory Visit: Payer: Self-pay

## 2020-03-27 ENCOUNTER — Encounter: Payer: Self-pay | Admitting: Urology

## 2020-03-27 VITALS — BP 118/70 | HR 86 | Ht 68.0 in | Wt 205.0 lb

## 2020-03-27 DIAGNOSIS — N401 Enlarged prostate with lower urinary tract symptoms: Secondary | ICD-10-CM | POA: Diagnosis not present

## 2020-03-27 DIAGNOSIS — N200 Calculus of kidney: Secondary | ICD-10-CM | POA: Diagnosis not present

## 2020-03-27 DIAGNOSIS — N138 Other obstructive and reflux uropathy: Secondary | ICD-10-CM | POA: Diagnosis not present

## 2020-03-27 DIAGNOSIS — N3281 Overactive bladder: Secondary | ICD-10-CM | POA: Diagnosis not present

## 2020-03-27 DIAGNOSIS — R102 Pelvic and perineal pain: Secondary | ICD-10-CM | POA: Diagnosis not present

## 2020-03-27 LAB — BLADDER SCAN AMB NON-IMAGING

## 2020-03-27 NOTE — Progress Notes (Signed)
   03/27/2020 1:53 PM   Jerome Rodriguez 1955/06/20 [id number]  Reason for visit: Follow up urinary symptoms, hematospermia, history of nephrolithiasis  HPI: I saw [name] back for the above issues.  In November 2021 he reported some new hematospermia, urinary urgency, and pelvic discomfort.  Urinalysis was benign, but I suspect he likely had a prostatitis and we treated him with a course of Bactrim as well as NSAIDs.  He thinks the NSAIDs improved his symptoms primarily, and things have been going well.  We also perform cystoscopy in December 2021 to rule out urethral stricture, and this was normal.  He is not currently on any prostate medications.  IPSS score today is 7, with quality of life mostly satisfied, and PVR is normal at 57 mL.  He has not had any further hematospermia, and denies any gross hematuria.  In terms of nephrolithiasis, he underwent left ureteroscopy in July 2021 for a large left-sided renal stone.  He denies any stone episodes since that time.  RTC 1 year with KUB prior, IPSS, PVR.  If new urinary symptoms okay to schedule with urinalysis and culture prior   Billey Co, MD  [facility] [address], [place], [place] [address] [phone]

## 2020-03-28 ENCOUNTER — Other Ambulatory Visit: Payer: Self-pay | Admitting: Family Medicine

## 2020-03-28 MED FILL — METOPROLOL SUCCINATE ER 25: 25 | 90 days supply | Qty: 90 | Fill #3

## 2020-03-28 MED FILL — ENALAPRIL MALEATE 2.5 MG TA: 2.5 | 90 days supply | Qty: 90 | Fill #3

## 2020-03-28 MED FILL — INVOKANA 300 MG TABLET: 300 | 30 days supply | Qty: 30 | Fill #0

## 2020-04-01 ENCOUNTER — Encounter: Payer: Self-pay | Admitting: Orthopaedic Surgery

## 2020-04-01 ENCOUNTER — Ambulatory Visit: Payer: PPO | Admitting: Orthopaedic Surgery

## 2020-04-01 ENCOUNTER — Other Ambulatory Visit: Payer: Self-pay

## 2020-04-01 DIAGNOSIS — G8929 Other chronic pain: Secondary | ICD-10-CM

## 2020-04-01 DIAGNOSIS — M25512 Pain in left shoulder: Secondary | ICD-10-CM

## 2020-04-01 NOTE — Progress Notes (Signed)
The patient comes in for follow-up after having a MRI of his left shoulder.  He has significant weakness in the rotator cuff all clinical exam with difficulty maintaining a position of abduction above 90 degrees.  He also uses more of his deltoids to abduct his shoulder.  He is a previous patient of Dr. Wiliam Ke with [facility] who did surgery on his right shoulder years ago with rotator cuff surgery.  He says that shoulder has done great.  On exam he is able to easily abduct and obtain that easily forward flex and overhead position with his right shoulder.  His left shoulder shows significant weakness with abduction.  The MRI does show a full-thickness retracted rotator cuff tear that is retracted back to the level of the glenoid.  The humeral head is high riding and shows evidence of rotator cuff arthropathy.  There is mild arthritis of the glenohumeral joint.  Since he had such a good outcome with his right shoulder the Dr. Veverly Fells operated on, I feel like he needs a referral back to Dr. Alma Friendly for his left shoulder and the patient agrees to this as well.  We will work on making that referral.

## 2020-04-15 ENCOUNTER — Other Ambulatory Visit: Payer: Self-pay | Admitting: Family Medicine

## 2020-04-15 DIAGNOSIS — E118 Type 2 diabetes mellitus with unspecified complications: Secondary | ICD-10-CM

## 2020-04-29 ENCOUNTER — Other Ambulatory Visit (HOSPITAL_COMMUNITY): Payer: Self-pay

## 2020-04-29 MED FILL — Canagliflozin Tab 300 MG: ORAL | 30 days supply | Qty: 30 | Fill #0 | Status: AC

## 2020-04-30 ENCOUNTER — Other Ambulatory Visit (HOSPITAL_COMMUNITY): Payer: Self-pay

## 2020-04-30 ENCOUNTER — Other Ambulatory Visit: Payer: Self-pay | Admitting: Cardiovascular Disease

## 2020-04-30 MED ORDER — CLOPIDOGREL BISULFATE 75 MG PO TABS
75.0000 mg | ORAL_TABLET | Freq: Every day | ORAL | 0 refills | Status: DC
  Filled 2020-04-30: qty 30, 30d supply, fill #0

## 2020-05-01 DIAGNOSIS — M75122 Complete rotator cuff tear or rupture of left shoulder, not specified as traumatic: Secondary | ICD-10-CM | POA: Diagnosis not present

## 2020-05-03 ENCOUNTER — Other Ambulatory Visit (HOSPITAL_COMMUNITY): Payer: Self-pay

## 2020-05-28 ENCOUNTER — Other Ambulatory Visit (HOSPITAL_COMMUNITY): Payer: Self-pay

## 2020-05-28 ENCOUNTER — Other Ambulatory Visit: Payer: Self-pay | Admitting: Family Medicine

## 2020-05-28 MED ORDER — CANAGLIFLOZIN 300 MG PO TABS
300.0000 mg | ORAL_TABLET | Freq: Every day | ORAL | 1 refills | Status: DC
  Filled 2020-05-28: qty 30, 30d supply, fill #0
  Filled 2021-03-04: qty 30, 30d supply, fill #1

## 2020-05-29 ENCOUNTER — Telehealth: Payer: Self-pay | Admitting: Family Medicine

## 2020-05-29 ENCOUNTER — Other Ambulatory Visit (HOSPITAL_COMMUNITY): Payer: Self-pay

## 2020-05-29 ENCOUNTER — Telehealth: Payer: Self-pay | Admitting: Cardiovascular Disease

## 2020-05-29 MED ORDER — CLOPIDOGREL BISULFATE 75 MG PO TABS
75.0000 mg | ORAL_TABLET | Freq: Every day | ORAL | 0 refills | Status: DC
  Filled 2020-05-29: qty 90, 90d supply, fill #0

## 2020-05-29 NOTE — Telephone Encounter (Signed)
*  STAT* If patient is at the pharmacy, call can be transferred to refill team.   1. Which medications need to be refilled? (please list name of each medication and dose if known)  clopidogrel (PLAVIX) 75 MG tablet  2. Which pharmacy/location (including street and city if local pharmacy) is medication to be sent to? [facility]  3. Do they need a 30 day or 90 day supply? 90   Patient is not scheduled to see Richardson Dopp until 08/30/20 but will not have enough medication to last until his appointment

## 2020-05-29 NOTE — Telephone Encounter (Signed)
Pt's medication was sent to pt's pharmacy as requested. Confirmation received.  °

## 2020-05-29 NOTE — Telephone Encounter (Signed)
Ret call to pt, he had left a message that he wanted his medicine for a year. I reminded pt of his appt on Tues with fasting labs. I explained we cannot give the Invokana for a year as Dr Redmond School will want to keep a check on his diabetes.  Pt said that is fine.  He just does not want to run out of meds.  He has picked up the refill.

## 2020-05-30 ENCOUNTER — Ambulatory Visit: Payer: Medicare Other | Admitting: Family Medicine

## 2020-06-04 ENCOUNTER — Other Ambulatory Visit (HOSPITAL_COMMUNITY): Payer: Self-pay

## 2020-06-04 ENCOUNTER — Ambulatory Visit
Admission: RE | Admit: 2020-06-04 | Discharge: 2020-06-04 | Disposition: A | Discharge status: Home / Self Care | Payer: PPO | Source: Ambulatory Visit | Attending: Family Medicine | Admitting: Family Medicine

## 2020-06-04 ENCOUNTER — Other Ambulatory Visit: Payer: Self-pay

## 2020-06-04 ENCOUNTER — Encounter: Payer: Self-pay | Admitting: Family Medicine

## 2020-06-04 ENCOUNTER — Ambulatory Visit (INDEPENDENT_AMBULATORY_CARE_PROVIDER_SITE_OTHER): Payer: PPO | Admitting: Family Medicine

## 2020-06-04 VITALS — BP 100/62 | HR 74 | Temp 96.0°F | Ht 67.25 in | Wt 202.0 lb

## 2020-06-04 DIAGNOSIS — M1712 Unilateral primary osteoarthritis, left knee: Secondary | ICD-10-CM | POA: Diagnosis not present

## 2020-06-04 DIAGNOSIS — Z1211 Encounter for screening for malignant neoplasm of colon: Secondary | ICD-10-CM

## 2020-06-04 DIAGNOSIS — Z720 Tobacco use: Secondary | ICD-10-CM | POA: Diagnosis not present

## 2020-06-04 DIAGNOSIS — E1159 Type 2 diabetes mellitus with other circulatory complications: Secondary | ICD-10-CM | POA: Diagnosis not present

## 2020-06-04 DIAGNOSIS — I2510 Atherosclerotic heart disease of native coronary artery without angina pectoris: Secondary | ICD-10-CM

## 2020-06-04 DIAGNOSIS — E1169 Type 2 diabetes mellitus with other specified complication: Secondary | ICD-10-CM

## 2020-06-04 DIAGNOSIS — H4010X1 Unspecified open-angle glaucoma, mild stage: Secondary | ICD-10-CM | POA: Insufficient documentation

## 2020-06-04 DIAGNOSIS — E785 Hyperlipidemia, unspecified: Secondary | ICD-10-CM

## 2020-06-04 DIAGNOSIS — E118 Type 2 diabetes mellitus with unspecified complications: Secondary | ICD-10-CM

## 2020-06-04 DIAGNOSIS — M25562 Pain in left knee: Secondary | ICD-10-CM

## 2020-06-04 DIAGNOSIS — I152 Hypertension secondary to endocrine disorders: Secondary | ICD-10-CM | POA: Diagnosis not present

## 2020-06-04 DIAGNOSIS — E1121 Type 2 diabetes mellitus with diabetic nephropathy: Secondary | ICD-10-CM

## 2020-06-04 DIAGNOSIS — N3281 Overactive bladder: Secondary | ICD-10-CM

## 2020-06-04 DIAGNOSIS — E669 Obesity, unspecified: Secondary | ICD-10-CM | POA: Diagnosis not present

## 2020-06-04 DIAGNOSIS — Z0000 Encounter for general adult medical examination without abnormal findings: Secondary | ICD-10-CM

## 2020-06-04 DIAGNOSIS — N200 Calculus of kidney: Secondary | ICD-10-CM

## 2020-06-04 DIAGNOSIS — Z23 Encounter for immunization: Secondary | ICD-10-CM | POA: Diagnosis not present

## 2020-06-04 DIAGNOSIS — I700 Atherosclerosis of aorta: Secondary | ICD-10-CM | POA: Diagnosis not present

## 2020-06-04 DIAGNOSIS — Z1159 Encounter for screening for other viral diseases: Secondary | ICD-10-CM

## 2020-06-04 DIAGNOSIS — H9112 Presbycusis, left ear: Secondary | ICD-10-CM

## 2020-06-04 DIAGNOSIS — G8929 Other chronic pain: Secondary | ICD-10-CM

## 2020-06-04 LAB — POCT GLYCOSYLATED HEMOGLOBIN (HGB A1C): Hemoglobin A1C: 7.6 % — AB (ref 4.0–5.6)

## 2020-06-04 MED ORDER — ENALAPRIL MALEATE 2.5 MG PO TABS
2.5000 mg | ORAL_TABLET | Freq: Every day | ORAL | 3 refills | Status: DC
  Filled 2020-06-04: qty 90, 90d supply, fill #0
  Filled 2020-09-26: qty 90, 90d supply, fill #1
  Filled 2020-12-24: qty 90, 90d supply, fill #2
  Filled 2021-03-27: qty 90, 90d supply, fill #3

## 2020-06-04 MED ORDER — METOPROLOL SUCCINATE ER 25 MG PO TB24
25.0000 mg | ORAL_TABLET | Freq: Every day | ORAL | 3 refills | Status: DC
  Filled 2020-06-04: qty 90, 90d supply, fill #0
  Filled 2020-09-26: qty 90, 90d supply, fill #1
  Filled 2020-12-24: qty 90, 90d supply, fill #2
  Filled 2021-03-27: qty 90, 90d supply, fill #3

## 2020-06-04 MED ORDER — CLOPIDOGREL BISULFATE 75 MG PO TABS
75.0000 mg | ORAL_TABLET | Freq: Every day | ORAL | 0 refills | Status: DC
  Filled 2020-06-04 – 2020-08-26 (×2): qty 90, 90d supply, fill #0

## 2020-06-04 MED ORDER — ATORVASTATIN CALCIUM 20 MG PO TABS
20.0000 mg | ORAL_TABLET | Freq: Every day | ORAL | 0 refills | Status: DC
  Filled 2020-06-04: qty 90, 90d supply, fill #0

## 2020-06-04 MED ORDER — CANAGLIFLOZIN 300 MG PO TABS
300.0000 mg | ORAL_TABLET | Freq: Every day | ORAL | 1 refills | Status: DC
  Filled 2020-06-04 – 2020-06-26 (×2): qty 30, 30d supply, fill #0
  Filled 2020-07-24: qty 30, 30d supply, fill #1

## 2020-06-04 NOTE — Patient Instructions (Signed)
  [name] , Thank you for taking time to come for your Medicare Wellness Visit. I appreciate your ongoing commitment to your health goals. Please review the following plan we discussed and let me know if I can assist you in the future.   These are the goals we discussed: Goals   None     This is a list of the screening recommended for you and due dates:  Health Maintenance  Topic Date Due  . Eye exam for diabetics  02/22/2013  . Complete foot exam   05/29/2020  . Colon Cancer Screening  06/04/2020*  . Hepatitis C Screening: USPSTF Recommendation to screen - Ages 18-79 yo.  06/04/2020*  . Hemoglobin A1C  08/04/2020  . Flu Shot  08/05/2020  . Tetanus Vaccine  09/13/2029  . COVID-19 Vaccine  Completed  . Zoster (Shingles) Vaccine  Completed  . HPV Vaccine  Aged Out  . Pneumonia vaccines  Discontinued  *Topic was postponed. The date shown is not the original due date.

## 2020-06-04 NOTE — Progress Notes (Signed)
Jerome Rodriguez is a 65 y.o. male who presents for annual wellness visit,CPE and follow-up on chronic medical conditions.  He complains of a popping sensation in his left knee that he states has been going on for over a year.  It does not walk or swell.  He does continue to walk.  He did have some difficulty in the past with left shoulder pain that I initially thought was bicipital tendinitis however it ended up being rotator cuff tear.  Apparently it is not amenable to surgical intervention.  He does have underlying heart disease and does see cardiology regularly.  He does have diabetes and does exercise twice per week as well as do yard work.  He has seen ophthalmology.  Continues to smoke cigar.  He continues on metformin and Invokana.  He is on low-dose enalapril and metoprolol.  He does have a history of OAB and has seen urology for that and has a history of kidney stone.  The urologist mentioned using low-dose Aleve to help relieve the OAB symptoms and it has worked.  He is aware of the fact that the can also have an effect on bleeding.  He is due for colon cancer screening.  He did have a colonoscopy 10 years ago.  Otherwise he has no concerns or complaints.   Immunizations and Health Maintenance Immunization History  Administered Date(s) Administered  . Influenza Split 11/17/2010  . PFIZER(Purple Top)SARS-COV-2 Vaccination 03/27/2019, 04/19/2019, 10/19/2019  . Pneumococcal Conjugate-13 07/30/2014  . Pneumococcal Polysaccharide-23 05/04/2007  . Tdap 05/04/2007, 09/14/2019  . Zoster Recombinat (Shingrix) 09/14/2019, 11/24/2019  . Zoster, Live 07/30/2014   There are no preventive care reminders to display for this patient.  Last colonoscopy: endo/oscopies 02/18/10 Last PSA: 11/22/19 Dentist: N/A Ophtho: Q year  Exercise: walking an d yoga seven days a week for greater than one hour  Other doctors caring for patient include: Dr. Burt Knack cardio, Dr. [facility], Dr. Katy Fitch  eye  Advanced Directives: Does Patient Have a Medical Advance Directive?: Yes Type of Advance Directive: Living will Does patient want to make changes to medical advance directive?: No - Patient declined  Depression screen:  See questionnaire below.     Depression screen [facility] 2/9 06/04/2020 05/30/2019 10/22/2017  Decreased Interest 0 0 0  Down, Depressed, Hopeless 0 0 0  PHQ - 2 Score 0 0 0    Fall Screen: See Questionaire below.   Fall Risk  06/04/2020 05/30/2019  Falls in the past year? 0 0  Number falls in past yr: 0 -  Injury with Fall? 0 -  Risk for fall due to : History of fall(s) -  Follow up Falls prevention discussed -    ADL screen:  See questionnaire below.  Functional Status Survey: Is the patient deaf or have difficulty hearing?: Yes (left ear) Does the patient have difficulty seeing, even when wearing glasses/contacts?: No Does the patient have difficulty concentrating, remembering, or making decisions?: No Does the patient have difficulty walking or climbing stairs?: No Does the patient have difficulty dressing or bathing?: No Does the patient have difficulty doing errands alone such as visiting a doctor's office or shopping?: No   Review of Systems  Constitutional: -, -unexpected weight change, -anorexia, -fatigue Allergy: -sneezing, -itching, -congestion Dermatology: denies changing moles, rash, lumps ENT: -runny nose, -ear pain, -sore throat,  Cardiology:  -chest pain, -palpitations, -orthopnea, Respiratory: -cough, -shortness of breath, -dyspnea on exertion, -wheezing,  Gastroenterology: -abdominal pain, -nausea, -vomiting, -diarrhea, -constipation, -dysphagia Hematology: -bleeding or  bruising problems Musculoskeletal: -arthralgias, -myalgias, -joint swelling, -back pain, - Ophthalmology: -vision changes,  Urology: -dysuria, -difficulty urinating,  -urinary frequency, -urgency, incontinence Neurology: -, -numbness, , -memory loss, -falls,  -dizziness    PHYSICAL EXAM:  BP 100/62   Pulse 74   Temp (!) 96 F (35.6 C)   Ht 5' 7.25" (1.708 m)   Wt 202 lb (91.6 kg)   SpO2 96%   BMI 31.40 kg/m   General Appearance: Alert, cooperative, no distress, appears stated age Head: Normocephalic, without obvious abnormality, atraumatic Eyes: PERRL, conjunctiva/corneas clear, EOM's intact,  Ears: Normal TM's and external ear canals Nose: Nares normal, mucosa normal, no drainage or sinus   tenderness Throat: Lips, mucosa, and tongue normal; teeth and gums normal Neck: Supple, no lymphadenopathy, thyroid:no enlargement/tenderness/nodules; no carotid bruit or JVD Lungs: Clear to auscultation bilaterally without wheezes, rales or ronchi; respirations unlabored Heart: Regular rate and rhythm, S1 and S2 normal, no murmur, rub or gallop Abdomen: Soft, non-tender, nondistended, normoactive bowel sounds, no masses, no hepatosplenomegaly Extremities: Left knee exam shows no effusion or palpable tenderness.  Negative anterior drawer.  Medial and lateral collateral ligaments intact.  Negative McMurray's testing.  Clunking sensation was audible when he did bend his knee.   Pulses: 2+ and symmetric all extremities Skin: Skin color, texture, turgor normal, no rashes or lesions Lymph nodes: Cervical, supraclavicular, and axillary nodes normal Neurologic: CNII-XII intact, normal strength, sensation and gait; reflexes 2+ and symmetric throughout   Psych: Normal mood, affect, hygiene and grooming  ASSESSMENT/PLAN: Routine general medical examination at a health care facility  Type 2 diabetes with complication ([place]) - Plan: CBC with Differential/Platelet, Comprehensive metabolic panel, Lipid panel, POCT glycosylated hemoglobin (Hb A1C), POCT UA - Microalbumin, canagliflozin (INVOKANA) 300 MG TABS tablet  Hypertension associated with diabetes ([place]) - Plan: CBC with Differential/Platelet, Comprehensive metabolic panel, metoprolol succinate (TOPROL-XL)  25 MG 24 hr tablet, enalapril (VASOTEC) 2.5 MG tablet  Hyperlipidemia associated with type 2 diabetes mellitus (HCC) - Plan: Lipid panel, atorvastatin (LIPITOR) 20 MG tablet  Aortic atherosclerosis (HCC) - Plan: Lipid panel  Obesity (BMI 30-39.9)  ASHD (arteriosclerotic heart disease) - Plan: clopidogrel (PLAVIX) 75 MG tablet  Presbycusis of left ear, unspecified hearing status on contralateral side  Renal stones  Tobacco abuse  Diabetic nephropathy with proteinuria (HCC)  Chronic pain of left knee - Plan: DG Knee Complete 4 Views Left, Ambulatory referral to Orthopedic Surgery  Screening for colon cancer - Plan: Cologuard  Need for hepatitis C screening test - Plan: Hepatitis C antibody  Immunization, viral disease - Plan: PFIZER Comirnaty(GRAY TOP)COVID-19 Vaccine  OAB (overactive bladder)  Open-angle glaucoma of both eyes, mild stage, unspecified open-angle glaucoma type  Atherosclerosis of native coronary artery of native heart without angina pectoris - Plan: metoprolol succinate (TOPROL-XL) 25 MG 24 hr tablet  Continue on his present medication regimen.  recommended at least 30 minutes of aerobic activity at least 5 days/week; he will do this to see if that helps bring his hemoglobin A1c down.  I did discuss the possibility of placing him on a GLP-1.  Immunization recommendations discussed.  Pfizer shot given.  Colonoscopy recommendations reviewed.  Cologuard test ordered. I then discussed the use of Aleve to help with his OAB symptoms.  Discussed the risk of bleeding and the benefit.  Recommend minimum use of Aleve to help with his OAB symptoms.  He was comfortable with that.   Medicare Attestation I have personally reviewed: The patient's medical and social history Their  use of alcohol, tobacco or illicit drugs Their current medications and supplements The patient's functional ability including ADLs,fall risks, home safety risks, cognitive, and hearing and visual  impairment Diet and physical activities Evidence for depression or mood disorders  The patient's weight, height, and BMI have been recorded in the chart.  I have made referrals, counseling, and provided education to the patient based on review of the above and I have provided the patient with a written personalized care plan for preventive services.     Jill Alexanders, MD   06/04/2020

## 2020-06-05 ENCOUNTER — Other Ambulatory Visit (HOSPITAL_COMMUNITY): Payer: Self-pay

## 2020-06-05 LAB — COMPREHENSIVE METABOLIC PANEL
ALT: 15 IU/L (ref 0–44)
AST: 16 IU/L (ref 0–40)
Albumin/Globulin Ratio: 1.6 (ref 1.2–2.2)
Albumin: 4.2 g/dL (ref 3.8–4.8)
Alkaline Phosphatase: 141 IU/L — ABNORMAL HIGH (ref 44–121)
BUN/Creatinine Ratio: 13 (ref 10–24)
BUN: 14 mg/dL (ref 8–27)
Bilirubin Total: 0.6 mg/dL (ref 0.0–1.2)
CO2: 20 mmol/L (ref 20–29)
Calcium: 9.4 mg/dL (ref 8.6–10.2)
Chloride: 101 mmol/L (ref 96–106)
Creatinine, Ser: 1.07 mg/dL (ref 0.76–1.27)
Globulin, Total: 2.7 g/dL (ref 1.5–4.5)
Glucose: 159 mg/dL — ABNORMAL HIGH (ref 65–99)
Potassium: 4.9 mmol/L (ref 3.5–5.2)
Sodium: 137 mmol/L (ref 134–144)
Total Protein: 6.9 g/dL (ref 6.0–8.5)
eGFR: 77 mL/min/{1.73_m2} (ref >59)

## 2020-06-05 LAB — LIPID PANEL
Chol/HDL Ratio: 3.7 ratio (ref 0.0–5.0)
Cholesterol, Total: 158 mg/dL (ref 100–199)
HDL: 43 mg/dL (ref >39)
LDL Chol Calc (NIH): 95 mg/dL (ref 0–99)
Triglycerides: 108 mg/dL (ref 0–149)
VLDL Cholesterol Cal: 20 mg/dL (ref 5–40)

## 2020-06-05 LAB — CBC WITH DIFFERENTIAL/PLATELET
Basophils Absolute: 0.1 10*3/uL (ref 0.0–0.2)
Basos: 1 %
EOS (ABSOLUTE): 0.2 10*3/uL (ref 0.0–0.4)
Eos: 2 %
Hematocrit: 48 % (ref 37.5–51.0)
Hemoglobin: 16 g/dL (ref 13.0–17.7)
Immature Grans (Abs): 0 10*3/uL (ref 0.0–0.1)
Immature Granulocytes: 1 %
Lymphocytes Absolute: 1.8 10*3/uL (ref 0.7–3.1)
Lymphs: 20 %
MCH: 29.7 pg (ref 26.6–33.0)
MCHC: 33.3 g/dL (ref 31.5–35.7)
MCV: 89 fL (ref 79–97)
Monocytes Absolute: 0.7 10*3/uL (ref 0.1–0.9)
Monocytes: 8 %
Neutrophils Absolute: 5.9 10*3/uL (ref 1.4–7.0)
Neutrophils: 68 %
Platelets: 249 10*3/uL (ref 150–450)
RBC: 5.39 x10E6/uL (ref 4.14–5.80)
RDW: 13.7 % (ref 11.6–15.4)
WBC: 8.6 10*3/uL (ref 3.4–10.8)

## 2020-06-05 LAB — HEPATITIS C ANTIBODY: Hep C Virus Ab: <0.1 s/co ratio (ref 0.0–0.9)

## 2020-06-05 MED ORDER — ATORVASTATIN CALCIUM 40 MG PO TABS
40.0000 mg | ORAL_TABLET | Freq: Every day | ORAL | 3 refills | Status: DC
  Filled 2020-06-05: qty 90, 90d supply, fill #0
  Filled 2020-06-26: qty 90, 90d supply, fill #1

## 2020-06-05 NOTE — Addendum Note (Signed)
Addended by: Denita Lung on: 06/05/2020 08:09 AM   Modules accepted: Orders

## 2020-06-06 ENCOUNTER — Telehealth: Payer: Self-pay

## 2020-06-06 ENCOUNTER — Other Ambulatory Visit (HOSPITAL_COMMUNITY): Payer: Self-pay

## 2020-06-06 NOTE — Telephone Encounter (Signed)
Spoke to pt about increase in Liptor. Pt wanted to recheck with Dr. Redmond School and message was sent to him. [place]

## 2020-06-20 DIAGNOSIS — H0288A Meibomian gland dysfunction right eye, upper and lower eyelids: Secondary | ICD-10-CM | POA: Diagnosis not present

## 2020-06-20 DIAGNOSIS — H5211 Myopia, right eye: Secondary | ICD-10-CM | POA: Diagnosis not present

## 2020-06-20 DIAGNOSIS — H0288B Meibomian gland dysfunction left eye, upper and lower eyelids: Secondary | ICD-10-CM | POA: Diagnosis not present

## 2020-06-20 DIAGNOSIS — E119 Type 2 diabetes mellitus without complications: Secondary | ICD-10-CM | POA: Diagnosis not present

## 2020-06-20 DIAGNOSIS — H1045 Other chronic allergic conjunctivitis: Secondary | ICD-10-CM | POA: Diagnosis not present

## 2020-06-20 DIAGNOSIS — H40013 Open angle with borderline findings, low risk, bilateral: Secondary | ICD-10-CM | POA: Diagnosis not present

## 2020-06-20 DIAGNOSIS — H2513 Age-related nuclear cataract, bilateral: Secondary | ICD-10-CM | POA: Diagnosis not present

## 2020-06-20 DIAGNOSIS — H40053 Ocular hypertension, bilateral: Secondary | ICD-10-CM | POA: Diagnosis not present

## 2020-06-20 LAB — HM DIABETES EYE EXAM

## 2020-06-24 DIAGNOSIS — Z1211 Encounter for screening for malignant neoplasm of colon: Secondary | ICD-10-CM | POA: Diagnosis not present

## 2020-06-24 LAB — COLOGUARD

## 2020-06-25 DIAGNOSIS — M25562 Pain in left knee: Secondary | ICD-10-CM | POA: Diagnosis not present

## 2020-06-25 LAB — COLOGUARD: Cologuard: NEGATIVE

## 2020-06-26 ENCOUNTER — Other Ambulatory Visit (HOSPITAL_COMMUNITY): Payer: Self-pay

## 2020-06-27 ENCOUNTER — Other Ambulatory Visit (HOSPITAL_COMMUNITY): Payer: Self-pay

## 2020-06-27 ENCOUNTER — Ambulatory Visit (INDEPENDENT_AMBULATORY_CARE_PROVIDER_SITE_OTHER): Payer: PPO | Admitting: Family Medicine

## 2020-06-27 VITALS — BP 100/62 | Wt 202.0 lb

## 2020-06-27 DIAGNOSIS — E1169 Type 2 diabetes mellitus with other specified complication: Secondary | ICD-10-CM

## 2020-06-27 DIAGNOSIS — E785 Hyperlipidemia, unspecified: Secondary | ICD-10-CM

## 2020-06-27 MED ORDER — ATORVASTATIN CALCIUM 20 MG PO TABS
20.0000 mg | ORAL_TABLET | Freq: Every day | ORAL | 1 refills | Status: DC
  Filled 2020-06-27: qty 90, 90d supply, fill #0
  Filled 2020-09-26: qty 90, 90d supply, fill #1

## 2020-06-27 NOTE — Progress Notes (Signed)
   Subjective:    Patient ID: Jerome Rodriguez, male    DOB: 1955/10/13, 65 y.o.   MRN: [id number]  HPI Documentation for virtual audio and video telecommunications through [place] encounter: The patient was located at home. 2 patient identifiers used.  The provider was located in the office. The patient did consent to this visit and is aware of possible charges through their insurance for this visit. The other persons participating in this telemedicine service were none. Time spent on call was 10 minutes and in review of previous records >20 minutes total for counseling and coordination of care. This virtual service is not related to other E/M service within previous 7 days.  He states that the 40 mg Lipitor cause difficulty with pain, fatigue, muscle cramping.  He states that he now remembers being on the higher dose several years ago and had the same symptoms.  He decided to cut back to the 20 mg dosing and noted the symptoms did go away.  He is also making changes in his diet cutting back on high cholesterol types of foods to see what that will do to help.  He would like to try this and then see what the numbers look like with his next visit.  Review of Systems     Objective:   Physical Exam Alert and in no distress otherwise not examined       Assessment & Plan:  Hyperlipidemia associated with type 2 diabetes mellitus ([place]) - Plan: atorvastatin (LIPITOR) 20 MG tablet Since he is making changes in his diet, I think it is reasonable to see if the 20 mg and a change in his eating habits will make a difference in his lipids.  I will recheck with him in about 4 months.  He was comfortable with that.

## 2020-07-03 ENCOUNTER — Encounter: Payer: Self-pay | Admitting: Family Medicine

## 2020-07-17 DIAGNOSIS — M25562 Pain in left knee: Secondary | ICD-10-CM | POA: Diagnosis not present

## 2020-07-24 ENCOUNTER — Other Ambulatory Visit: Payer: Self-pay | Admitting: Family Medicine

## 2020-07-24 ENCOUNTER — Other Ambulatory Visit (HOSPITAL_COMMUNITY): Payer: Self-pay

## 2020-07-24 DIAGNOSIS — E118 Type 2 diabetes mellitus with unspecified complications: Secondary | ICD-10-CM

## 2020-08-01 DIAGNOSIS — S83241A Other tear of medial meniscus, current injury, right knee, initial encounter: Secondary | ICD-10-CM | POA: Diagnosis not present

## 2020-08-01 DIAGNOSIS — M25562 Pain in left knee: Secondary | ICD-10-CM | POA: Diagnosis not present

## 2020-08-15 ENCOUNTER — Ambulatory Visit: Payer: Self-pay | Admitting: Urology

## 2020-08-26 ENCOUNTER — Other Ambulatory Visit (HOSPITAL_COMMUNITY): Payer: Self-pay

## 2020-08-26 ENCOUNTER — Other Ambulatory Visit: Payer: Self-pay | Admitting: Family Medicine

## 2020-08-26 DIAGNOSIS — E118 Type 2 diabetes mellitus with unspecified complications: Secondary | ICD-10-CM

## 2020-08-27 ENCOUNTER — Other Ambulatory Visit (HOSPITAL_COMMUNITY): Payer: Self-pay

## 2020-08-27 MED ORDER — CANAGLIFLOZIN 300 MG PO TABS
300.0000 mg | ORAL_TABLET | Freq: Every day | ORAL | 1 refills | Status: DC
  Filled 2020-08-27: qty 30, 30d supply, fill #0
  Filled 2020-09-26: qty 30, 30d supply, fill #1
  Filled 2020-11-26: qty 30, 30d supply, fill #2

## 2020-08-28 ENCOUNTER — Other Ambulatory Visit (HOSPITAL_COMMUNITY): Payer: Self-pay

## 2020-08-29 NOTE — Progress Notes (Signed)
Cardiology Office Note:    Date:  08/30/2020   ID:  Jerome Rodriguez, DOB 1955/11/29, MRN [id number]  PCP:  Denita Lung, MD   [facility] Providers Cardiologist:  Sherren Mocha, MD Cardiology APP:  [place]on [place]   Referring MD: Denita Lung, MD   Chief Complaint:  Follow-up (CAD)    Patient Profile:    Jerome Rodriguez is a 65 y.o. male with:  Coronary artery disease  NSTEMI s/p Taxus DES to LAD in 2004 Myoview in 2013: low risk  S/p DES to LAD 2/2 ISR in 2014 Long term DAPT Diabetes mellitus  Hypertension  Hyperlipidemia  Tobacco abuse [place] in 6/21: no AAA  Prior CV studies: Cardiac catheterization August 06, 2012 Left mainstem: < 10%. Left anterior descending (LAD): Mid 90% in-stent restenosis, further distally there is a second stent with a 99% ISR; 60-70% stenosis at the takeoff D2 Left circumflex (LCx): mid 20%. dist 30-40% disease. Right coronary artery (RCA): prox segmental 50-60% plaque, 30% disease at the crux. Left ventriculography:   LVEF is estimated at 55-65%, there is no significant mitral regurgitation  PCI: Stent 3.0 x 38 mm Promus DES to mid to dist LAD  Myoview 11/11/2011 EF 74, no ischemia   History of Present Illness: [name] was last seen in clinic by Leanor Kail, PA-C in 4/21.  He returns for f/u.  He is here alone.  He has been doing well without chest discomfort, shortness of breath, syncope, orthopnea, leg edema.  He continues to smoke cigars.  He is trying to walk almost daily.  After his last set of labs, he has significantly changed his diet.        Past Medical History:  Diagnosis Date   Abnormal chest CT    a. 07/2012: scattered bilat noncalcified pulm nodules ranging in size from a few mm to a max of 20m in LLL - rec f/u in 3-6 mos.   CAD (coronary artery disease)    a. NSTEMI 10/04 => LHC: mLAD 99%=> Taxus DES to mLAD and Taxus DES to dLAD;  b. ETT-Myoview 11/2011: normal, EF 73%, no ischemia; c. 07/2012  Cath/PCI: LM <10, LAD 90 ISRp/99 ISRd (3.0x38 Promus DES), LCX 239m30-40d, RCA 50-60p, 30d, EF 55-65%.   Diabetes mellitus    Dyslipidemia    ED (erectile dysfunction)    History of kidney stones    HTN (hypertension)    Kidney stone    Myocardial infarction (HCC)    Obesity    Smoker    CIGARS    Current Medications: Current Meds  Medication Sig   aspirin 81 MG EC tablet Take 1 tablet (81 mg total) by mouth daily.   atorvastatin (LIPITOR) 20 MG tablet Take 1 tablet (20 mg total) by mouth daily.   canagliflozin (INVOKANA) 300 MG TABS tablet Take 1 tablet (300 mg total) by mouth daily before breakfast.   clopidogrel (PLAVIX) 75 MG tablet Take 1 tablet (75 mg total) by mouth daily.   enalapril (VASOTEC) 2.5 MG tablet Take 1 tablet (2.5 mg total) by mouth daily.   glucose blood (TRUETEST TEST) test strip 1 each by Other route 2 (two) times daily. Use as instructed (This is for Ture test DX: E11.9)   Lancets Misc. MISC 1 each by Does not apply route 2 (two) times daily. (This is for ture test DX: E11.9)   loratadine (CLARITIN) 10 MG tablet Take 10 mg by mouth daily.   metFORMIN (GLUCOPHAGE-XR) 750 MG 24  hr tablet TAKE 1 TABLET BY MOUTH IN THE MORNING AND AT BEDTIME   metoprolol succinate (TOPROL-XL) 25 MG 24 hr tablet Take 1 tablet (25 mg total) by mouth daily.   Multiple Vitamin (MULTIVITAMIN) tablet Take 1 tablet by mouth daily.   nitroGLYCERIN (NITROSTAT) 0.4 MG SL tablet Place 1 tablet (0.4 mg total) under the tongue every 5 (five) minutes x 3 doses as needed for chest pain.     Allergies:   Effient [prasugrel] and Lactose intolerance (gi)   Social History   Tobacco Use   Smoking status: Light Smoker    Types: Cigars   Smokeless tobacco: Never   Tobacco comments:    smoke 1--d for 25 years; quit 6 months ago. is now smoking ciagrs occasionally   Vaping Use   Vaping Use: Never used  Substance Use Topics   Alcohol use: No    Comment: rare    Drug use: No     Family  Hx: The patient's family history includes Diabetic kidney disease in his father and mother; Heart attack in his father and mother; Heart failure in his father.  Review of Systems  Gastrointestinal:  Positive for bloating and heartburn.    EKGs/Labs/Other Test Reviewed:    EKG:  EKG is ordered today.  The ekg ordered today demonstrates normal sinus rhythm, heart rate 77, normal axis, no ST-T wave changes, QTC 445  Recent Labs: 06/04/2020: ALT 15; BUN 14; Creatinine, Ser 1.07; Hemoglobin 16.0; Platelets 249; Potassium 4.9; Sodium 137   Recent Lipid Panel Lab Results  Component Value Date/Time   CHOL 158 06/04/2020 09:40 AM   TRIG 108 06/04/2020 09:40 AM   HDL 43 06/04/2020 09:40 AM   LDLCALC 95 06/04/2020 09:40 AM      Risk Assessment/Calculations:          Physical Exam:    VS:  BP (!) 112/50   Pulse 77   Ht 5' 7.5" (1.715 m)   Wt 194 lb (88 kg)   SpO2 95%   BMI 29.94 kg/m     Wt Readings from Last 3 Encounters:  08/30/20 194 lb (88 kg)  06/27/20 202 lb (91.6 kg)  06/04/20 202 lb (91.6 kg)     Constitutional:      Appearance: Healthy appearance. Not in distress.  Neck:     Vascular: No carotid bruit. JVD normal.  Pulmonary:     Effort: Pulmonary effort is normal.     Breath sounds: No wheezing. No rales.  Cardiovascular:     Normal rate. Regular rhythm. Normal S1. Normal S2.      Murmurs: There is no murmur.  Edema:    Peripheral edema absent.  Abdominal:     Palpations: Abdomen is soft.  Skin:    General: Skin is warm and dry.  Neurological:     General: No focal deficit present.     Mental Status: Alert and oriented to person, place and time.     Cranial Nerves: Cranial nerves are intact.         ASSESSMENT & PLAN:    1. Coronary artery disease involving native coronary artery of native heart without angina pectoris History of non-STEMI in 2004 treated with a Taxus DES to the LAD.  He underwent DES to the LAD in 2014 secondary to in-stent  restenosis.  He is doing well without anginal symptoms.  Electrocardiogram demonstrates no acute changes.  Continue aspirin 81 mg daily, atorvastatin 20 mg daily, clopidogrel 75 mg daily, metoprolol  succinate 25 mg daily.  Follow-up in 1 year.  2. Essential hypertension Blood pressure is well controlled.  Continue enalapril 2.5 mg daily, metoprolol succinate 25 mg daily.  3. Hyperlipidemia associated with type 2 diabetes mellitus ([place]) Recent LDL 95.  We discussed the importance of maintaining LDL <70.  He had side effects to higher doses of atorvastatin.  He has drastically changed his diet since his last set of labs.  He notes that he has follow-up labs pending with primary care in the next couple of months.  4. Tobacco abuse He is trying to quit.     Dispo:  Return in about 1 year (around 08/30/2021) for Routine Follow Up, w/ Dr. Burt Knack, or Richardson Dopp, PA-C.   Medication Adjustments/Labs and Tests Ordered: Current medicines are reviewed at length with the patient today.  Concerns regarding medicines are outlined above.  Tests Ordered: Orders Placed This Encounter  Procedures   EKG 12-Lead   Medication Changes: No orders of the defined types were placed in this encounter.   Signed, Richardson Dopp, PA-C  08/30/2020 10:20 AM    [facility] [place], [place], [place]  [address] Phone: [phone]; Fax: [phone]

## 2020-08-30 ENCOUNTER — Ambulatory Visit: Payer: PPO | Admitting: Physician Assistant

## 2020-08-30 ENCOUNTER — Encounter: Payer: Self-pay | Admitting: Physician Assistant

## 2020-08-30 ENCOUNTER — Other Ambulatory Visit: Payer: Self-pay

## 2020-08-30 VITALS — BP 112/50 | HR 77 | Ht 67.5 in | Wt 194.0 lb

## 2020-08-30 DIAGNOSIS — I10 Essential (primary) hypertension: Secondary | ICD-10-CM | POA: Diagnosis not present

## 2020-08-30 DIAGNOSIS — E1169 Type 2 diabetes mellitus with other specified complication: Secondary | ICD-10-CM

## 2020-08-30 DIAGNOSIS — Z720 Tobacco use: Secondary | ICD-10-CM

## 2020-08-30 DIAGNOSIS — E785 Hyperlipidemia, unspecified: Secondary | ICD-10-CM

## 2020-08-30 DIAGNOSIS — I2510 Atherosclerotic heart disease of native coronary artery without angina pectoris: Secondary | ICD-10-CM

## 2020-08-30 NOTE — Patient Instructions (Signed)
Medication Instructions:  Your physician recommends that you continue on your current medications as directed. Please refer to the Current Medication list given to you today.  It is ok to try Pepcid (famotidine) Over The Counter to use as needed for Acid Reflux/Gas.   *If you need a refill on your cardiac medications before your next appointment, please call your pharmacy*  Lab Work:  -NONE  If you have labs (blood work) drawn today and your tests are completely normal, you will receive your results only by: [place] (if you have MyChart) OR A paper copy in the mail If you have any lab test that is abnormal or we need to change your treatment, we will call you to review the results.  Testing/Procedures:  -NONE  Follow-Up: At [facility], you and your health needs are our priority.  As part of our continuing mission to provide you with exceptional heart care, we have created designated Provider Care Teams.  These Care Teams include your primary Cardiologist (physician) and Advanced Practice Providers (APPs -  Physician Assistants and Nurse Practitioners) who all work together to provide you with the care you need, when you need it.  We recommend signing up for the patient portal called "MyChart".  Sign up information is provided on this After Visit Summary.  MyChart is used to connect with patients for Virtual Visits (Telemedicine).  Patients are able to view lab/test results, encounter notes, upcoming appointments, etc.  Non-urgent messages can be sent to your provider as well.   To learn more about what you can do with MyChart, go to [email or web address].    Your next appointment:   1 year(s)  The format for your next appointment:   In Person  Provider:   You may see Sherren Mocha, MD or one of the following Advanced Practice Providers on your designated Care Team:   Richardson Dopp, PA-C  Other Instructions Your physician wants you to follow-up in: 1 year with Dr.  Burt Knack or Richardson Dopp, PA-C.  You will receive a reminder letter in the mail two months in advance. If you don't receive a letter, please call our office to schedule the follow-up appointment.

## 2020-09-26 ENCOUNTER — Other Ambulatory Visit (HOSPITAL_COMMUNITY): Payer: Self-pay

## 2020-09-27 ENCOUNTER — Telehealth: Payer: Self-pay

## 2020-09-27 NOTE — Telephone Encounter (Signed)
Called pt back concerning invokana . No answer lvm for him to call back. [place]

## 2020-09-27 NOTE — Telephone Encounter (Signed)
Please advise if there is a pt assistant program that can help pt with cost of invokana. Pt does  have a month supply for now. [place]

## 2020-10-04 ENCOUNTER — Other Ambulatory Visit: Payer: Self-pay

## 2020-10-04 ENCOUNTER — Ambulatory Visit (INDEPENDENT_AMBULATORY_CARE_PROVIDER_SITE_OTHER): Payer: PPO | Admitting: Family Medicine

## 2020-10-04 VITALS — BP 100/66 | HR 65 | Temp 96.0°F | Wt 197.8 lb

## 2020-10-04 DIAGNOSIS — H9112 Presbycusis, left ear: Secondary | ICD-10-CM

## 2020-10-04 DIAGNOSIS — I700 Atherosclerosis of aorta: Secondary | ICD-10-CM

## 2020-10-04 DIAGNOSIS — E785 Hyperlipidemia, unspecified: Secondary | ICD-10-CM | POA: Diagnosis not present

## 2020-10-04 DIAGNOSIS — I152 Hypertension secondary to endocrine disorders: Secondary | ICD-10-CM | POA: Diagnosis not present

## 2020-10-04 DIAGNOSIS — E118 Type 2 diabetes mellitus with unspecified complications: Secondary | ICD-10-CM | POA: Diagnosis not present

## 2020-10-04 DIAGNOSIS — E1159 Type 2 diabetes mellitus with other circulatory complications: Secondary | ICD-10-CM

## 2020-10-04 DIAGNOSIS — E1169 Type 2 diabetes mellitus with other specified complication: Secondary | ICD-10-CM | POA: Diagnosis not present

## 2020-10-04 LAB — POCT GLYCOSYLATED HEMOGLOBIN (HGB A1C): Hemoglobin A1C: 7.5 % — AB (ref 4.0–5.6)

## 2020-10-04 NOTE — Progress Notes (Signed)
  Subjective:    Patient ID: Jerome Rodriguez, male    DOB: [birth year], 65 y.o.   MRN: [id number]  Jerome Rodriguez is a 65 y.o. male who presents for follow-up of Type 2 diabetes mellitus.  Home blood sugar records: fasting range: 110-130 avg Current symptoms/problems include none and have been improving. Daily foot checks:yes   Any foot concerns: none at this time Exercise:  walking and stationary bike QD Die he continues on Invokana and metformin and is doing fine on those.  He has lost several pounds.  Continues on Lipitor, Plavix, Vasotec.  His allergies seem to be under good control.  He is still not interested in using a hearing aid. The following portions of the patient's history were reviewed and updated as appropriate: allergies, current medications, past medical history, past social history and problem list.  ROS as in subjective above.     Objective:    Physical Exam Alert and in no distress otherwise not examined. Hemoglobin A1c is 7.5. Lab Review Diabetic Labs Latest Ref Rng & Units 06/04/2020 02/05/2020 10/05/2019 05/30/2019 12/22/2018  HbA1c 4.0 - 5.6 % 7.6(A) 7.1(A) 7.4(A) 11.8(A) 8.2(A)  Microalbumin mg/L - - - - -  Micro/Creat Ratio 0 - 29 mg/g creat - - - 219(H) -  Chol 100 - 199 mg/dL 158 - - 126 -  HDL >39 mg/dL 43 - - 39(L) -  Calc LDL 0 - 99 mg/dL 95 - - 67 -  Triglycerides 0 - 149 mg/dL 108 - - 105 -  Creatinine 0.76 - 1.27 mg/dL 1.07 - - 1.11 -   BP/Weight 08/30/2020 06/27/2020 06/04/2020 03/27/2020 6/33/3545  Systolic BP 625 638 937 342 92  Diastolic BP 50 62 62 70 66  Wt. (Lbs) 194 202 202 205 205  BMI 29.94 31.4 31.4 31.17 32.11   Foot/eye exam completion dates 06/04/2020 05/30/2019  Eye Exam - -  Foot Form Completion Done Done    Jerome Rodriguez  reports that he has been smoking cigars. He has never used smokeless tobacco. He reports that he does not drink alcohol and does not use drugs.     Assessment & Plan:    Atherosclerosis of aorta  (HCC)  Hyperlipidemia associated with type 2 diabetes mellitus ([place])  Type 2 diabetes with complication ([place]) - Plan: POCT glycosylated hemoglobin (Hb A1C)  Hypertension associated with diabetes ([place])  Presbycusis of left ear, unspecified hearing status on contralateral side  Rx changes: none Education: Reviewed 'ABCs' of diabetes management (respective goals in parentheses):  A1C (<7), blood pressure (<130/80), and cholesterol (LDL <100). Compliance at present is estimated to be good. Efforts to improve compliance (if necessary) will be directed at continue to take good care of himself. Follow up: 6 months

## 2020-10-21 ENCOUNTER — Telehealth: Payer: Self-pay

## 2020-10-21 NOTE — Telephone Encounter (Signed)
Pt will come by the office tomorrow and complete application

## 2020-10-21 NOTE — Telephone Encounter (Signed)
Called pt and informed of Pt Assistance program for Invokana, we do not have any samples at this time.  Requested samples online

## 2020-10-23 ENCOUNTER — Other Ambulatory Visit: Payer: Self-pay | Admitting: Family Medicine

## 2020-10-23 ENCOUNTER — Other Ambulatory Visit (HOSPITAL_COMMUNITY): Payer: Self-pay

## 2020-10-23 DIAGNOSIS — E118 Type 2 diabetes mellitus with unspecified complications: Secondary | ICD-10-CM

## 2020-10-23 NOTE — Telephone Encounter (Signed)
Application completed and faxed, samples given to hold patient

## 2020-10-28 NOTE — Telephone Encounter (Signed)
Pt Assistance application completed

## 2020-11-26 ENCOUNTER — Other Ambulatory Visit (HOSPITAL_COMMUNITY): Payer: Self-pay

## 2020-11-26 ENCOUNTER — Other Ambulatory Visit: Payer: Self-pay | Admitting: Family Medicine

## 2020-11-26 DIAGNOSIS — I2510 Atherosclerotic heart disease of native coronary artery without angina pectoris: Secondary | ICD-10-CM

## 2020-11-26 MED ORDER — CLOPIDOGREL BISULFATE 75 MG PO TABS
75.0000 mg | ORAL_TABLET | Freq: Every day | ORAL | 0 refills | Status: DC
  Filled 2020-11-26: qty 90, 90d supply, fill #0

## 2020-11-27 ENCOUNTER — Other Ambulatory Visit (HOSPITAL_COMMUNITY): Payer: Self-pay

## 2020-11-27 ENCOUNTER — Telehealth: Payer: Self-pay

## 2020-11-27 NOTE — Telephone Encounter (Signed)
Pt requested samples Invokana to hold til end of year when insurance changes
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
## 2020-12-06 NOTE — Telephone Encounter (Signed)
Samples given.  

## 2020-12-10 ENCOUNTER — Telehealth: Payer: Self-pay | Admitting: Family Medicine

## 2020-12-10 NOTE — Chronic Care Management (AMB) (Signed)
  Chronic Care Management   Outreach Note  12/10/2020 Name: Jerome Rodriguez MRN: [id number] DOB: [birth year]  Referred by: Denita Lung, MD Reason for referral : No chief complaint on file.   An unsuccessful telephone outreach was attempted today. The patient was referred to the pharmacist for assistance with care management and care coordination.   Follow Up Plan:   Tatjana Dellinger Upstream Scheduler

## 2020-12-11 ENCOUNTER — Telehealth: Payer: Self-pay

## 2020-12-11 ENCOUNTER — Other Ambulatory Visit (HOSPITAL_COMMUNITY): Payer: Self-pay

## 2020-12-11 MED ORDER — EMPAGLIFLOZIN 25 MG PO TABS
25.0000 mg | ORAL_TABLET | Freq: Every day | ORAL | 1 refills | Status: DC
  Filled 2020-12-11 – 2021-03-04 (×2): qty 90, 90d supply, fill #0

## 2020-12-11 NOTE — Telephone Encounter (Signed)
Pt. Called stating that he called his ins. Company and they let him know that there are two alternatives cover by his ins. Janumet would be 100 dollars for 3 month supply or Jardiance which would be 200 dollars for a 3 month supply. He wanted to know if you could switch his Invokana to one of these, which ever you thought would be better for him.

## 2020-12-17 ENCOUNTER — Telehealth: Payer: Self-pay | Admitting: Family Medicine

## 2020-12-17 NOTE — Progress Notes (Signed)
°  Chronic Care Management   Note  12/17/2020 Name: Jerome Rodriguez MRN: [id number] DOB: 09/01/1955  Jerome Rodriguez is a 65 y.o. year old male who is a primary care patient of [employer], Elyse Jarvis, MD. I reached out to Jerome Rodriguez by phone today in response to a referral sent by [name]'s PCP, Denita Lung, MD.   [name] was given information about Chronic Care Management services today including:  CCM service includes personalized support from designated clinical staff supervised by his physician, including individualized plan of care and coordination with other care providers 24/7 contact phone numbers for assistance for urgent and routine care needs. Service will only be billed when office clinical staff spend 20 minutes or more in a month to coordinate care. Only one practitioner may furnish and bill the service in a calendar month. The patient may stop CCM services at any time (effective at the end of the month) by phone call to the office staff.   Patient agreed to services and verbal consent obtained.   Follow up plan:   [name]

## 2020-12-17 NOTE — Chronic Care Management (AMB) (Signed)
°  Chronic Care Management   Outreach Note  12/17/2020 Name: Jerome Rodriguez MRN: [id number] DOB: 09/21/1955  Referred by: Denita Lung, MD Reason for referral : No chief complaint on file.   A second unsuccessful telephone outreach was attempted today. The patient was referred to pharmacist for assistance with care management and care coordination.  Follow Up Plan:   Tatjana Dellinger Upstream Scheduler

## 2020-12-24 ENCOUNTER — Other Ambulatory Visit (HOSPITAL_COMMUNITY): Payer: Self-pay

## 2020-12-24 ENCOUNTER — Other Ambulatory Visit: Payer: Self-pay | Admitting: Family Medicine

## 2020-12-24 DIAGNOSIS — E785 Hyperlipidemia, unspecified: Secondary | ICD-10-CM

## 2020-12-24 MED ORDER — ATORVASTATIN CALCIUM 20 MG PO TABS
20.0000 mg | ORAL_TABLET | Freq: Every day | ORAL | 1 refills | Status: DC
  Filled 2020-12-24: qty 90, 90d supply, fill #0
  Filled 2021-03-27: qty 90, 90d supply, fill #1

## 2021-02-05 ENCOUNTER — Other Ambulatory Visit (HOSPITAL_COMMUNITY): Payer: Self-pay

## 2021-02-05 ENCOUNTER — Other Ambulatory Visit: Payer: Self-pay

## 2021-02-05 ENCOUNTER — Telehealth: Payer: Self-pay

## 2021-02-05 DIAGNOSIS — E118 Type 2 diabetes mellitus with unspecified complications: Secondary | ICD-10-CM

## 2021-02-05 MED ORDER — METFORMIN HCL ER 750 MG PO TB24: 750.0000 mg | ORAL_TABLET | Freq: Two times a day (BID) | ORAL | 1 refills | Status: DC

## 2021-02-05 MED ORDER — METFORMIN HCL ER 750 MG PO TB24
750.0000 mg | ORAL_TABLET | Freq: Two times a day (BID) | ORAL | 1 refills | Status: DC
  Filled 2021-02-05: qty 180, 90d supply, fill #0

## 2021-02-05 NOTE — Telephone Encounter (Signed)
Done KH 

## 2021-02-05 NOTE — Telephone Encounter (Signed)
Pt called and needs refill Metformin 90 days.  He has still been taking the samples of the Invokana, never started the new medication and he is going to call and see which is going to be cheaper this year, Invokana, Jardiance or Janumet and he will call back and let us know so we can update the chart.

## 2021-02-07 ENCOUNTER — Telehealth: Payer: Self-pay | Admitting: Pharmacist

## 2021-02-07 NOTE — Chronic Care Management (AMB) (Signed)
Chronic Care Management Pharmacy Assistant   Name: Jerome Rodriguez  MRN: [id number] DOB: [birth year]  Reason for Encounter: Chart prep for initial encounter with [place] Pharmacist on 02/13/21 at 1:30 in office   Conditions to be addressed/monitored: HTN, HLD, DMII, Overactive Bladder, and Tobacco Use  Recent office visits:  10/04/20 Denita Lung, MD - Patient presented for Atherosclerosis of aorta and other concerns. No medication changes.  Recent consult visits:  01/30/21 Sheran Luz (Audiology) - Patient presented for Mixed conductive and sensorineural hearing loss of left ear with restricted hearing of right ear and other concerns. No medication changes.  08/30/20 Sharmon Revere (Cardiology) - Patient presented for Coronary artery disease involving native coronary artery of native heart without angina pectoris and other concerns. No medication changes.  Hospital visits:  None in previous 6 months  Medications: Outpatient Encounter Medications as of 02/07/2021  Medication Sig   aspirin 81 MG EC tablet Take 1 tablet (81 mg total) by mouth daily.   atorvastatin (LIPITOR) 20 MG tablet Take 1 tablet (20 mg total) by mouth daily.   clopidogrel (PLAVIX) 75 MG tablet Take 1 tablet (75 mg total) by mouth daily.   empagliflozin (JARDIANCE) 25 MG TABS tablet Take 1 tablet (25 mg total) by mouth daily before breakfast.   enalapril (VASOTEC) 2.5 MG tablet Take 1 tablet (2.5 mg total) by mouth daily.   glucose blood (TRUETEST TEST) test strip 1 each by Other route 2 (two) times daily. Use as instructed (This is for Ture test DX: E11.9)   Lancets Misc. MISC 1 each by Does not apply route 2 (two) times daily. (This is for ture test DX: E11.9)   loratadine (CLARITIN) 10 MG tablet Take 10 mg by mouth daily.   metFORMIN (GLUCOPHAGE-XR) 750 MG 24 hr tablet Take 1 tablet (750 mg total) by mouth in the morning and at bedtime.   metoprolol succinate (TOPROL-XL)  25 MG 24 hr tablet Take 1 tablet (25 mg total) by mouth daily.   Multiple Vitamin (MULTIVITAMIN) tablet Take 1 tablet by mouth daily.   nitroGLYCERIN (NITROSTAT) 0.4 MG SL tablet Place 1 tablet (0.4 mg total) under the tongue every 5 (five) minutes x 3 doses as needed for chest pain.   No facility-administered encounter medications on file as of 02/07/2021.  Fill History :  atorvastatin (LIPITOR) 20 MG tablet 12/24/2020 90   clopidogrel (PLAVIX) 75 MG tablet 11/27/2020 90   enalapril (VASOTEC) 2.5 MG tablet 12/24/2020 90   METFORMIN ER 750MG   TAB 10/23/2020 90 180 each   metoprolol succinate (TOPROL-XL) 25 MG 24 hr tablet 12/24/2020 90     Have you seen any other providers since your last visit? Patient reports no  Any changes in your medications or health? Patient reports no  Any side effects from any medications? Patient reports no  Do you have an symptoms or problems not managed by your medications? Patient reports no  Any concerns about your health right now? Patient reports not at this time  Has your provider asked that you check blood pressure, blood sugar, or follow special diet at home? Patient reports he does not have a cuff at home is checking when he goes to the pharmacy and is checking sugars.  Do you get any type of exercise on a regular basis? Patient reports he walks an hour each day.  Can you think of a goal you would like to reach for your health? Patent reports he  would like to get his weight down to 180.  Do you have any problems getting your medications? Patent reports he is happy with his current pharmacy but his Anastasio Auerbach has been expensive, he is hoping with ins changes it will be less this year  Is there anything that you would like to discuss during the appointment? Patient reports not at this time  Patient aware to bring blood pressure cuff, medications that do not need refrigeration and supplements to appointment    Care Gaps: BP- 100/66  (10/04/20) AWV- 5/22 COVID Booster - Overdue Lab Results  Component Value Date   HGBA1C 7.5 (A) 10/04/2020    Star Rating Drugs: Atorvastatin (Lipitor) 20 mg - Last filled 12/24/20 90 DS at [facility]  Enalapril (Vasotec) 2.5 mg - Last filled 12/24/20 90 DS at [facility] Metformin 750 mg - Last filled 10/23/20 90 DS at [facility] ( not > 5 days did not verify)    [place] Pharmacist Assistant [phone]

## 2021-02-10 DIAGNOSIS — H90A21 Sensorineural hearing loss, unilateral, right ear, with restricted hearing on the contralateral side: Secondary | ICD-10-CM | POA: Diagnosis not present

## 2021-02-10 DIAGNOSIS — H90A32 Mixed conductive and sensorineural hearing loss, unilateral, left ear with restricted hearing on the contralateral side: Secondary | ICD-10-CM | POA: Diagnosis not present

## 2021-02-11 ENCOUNTER — Telehealth: Payer: Self-pay | Admitting: Pharmacist

## 2021-02-11 NOTE — Chronic Care Management (AMB) (Signed)
° ° °  Chronic Care Management Pharmacy Assistant   Name: Jerome Rodriguez  MRN: [id number] DOB: 1955-11-07   02/11/21 APPOINTMENT REMINDER   Called Patient No answer, left message of appointment on 02/13/21 at 1:30 via office visit with Jeni Salles, Pharm D.   Notified to have all medications, supplements, blood pressure and/or blood sugar logs available during appointment and to return call if need to reschedule.      Care Gaps: BP- 100/66 (10/04/20) AWV- 5/22 COVID Booster - Overdue Lab Results  Component Value Date   HGBA1C 7.5 (A) 10/04/2020    Star Rating Drug: Atorvastatin (Lipitor) 20 mg - Last filled 12/24/20 90 DS at [facility]  Enalapril (Vasotec) 2.5 mg - Last filled 12/24/20 90 DS at [facility] Outpatient Metformin 750 mg - Last filled 10/23/20 90 DS at [facility] ( not > 5 days did not verify)  Any gaps in medications fill history?  None     Medications: Outpatient Encounter Medications as of 02/11/2021  Medication Sig   aspirin 81 MG EC tablet Take 1 tablet (81 mg total) by mouth daily.   atorvastatin (LIPITOR) 20 MG tablet Take 1 tablet (20 mg total) by mouth daily.   clopidogrel (PLAVIX) 75 MG tablet Take 1 tablet (75 mg total) by mouth daily.   empagliflozin (JARDIANCE) 25 MG TABS tablet Take 1 tablet (25 mg total) by mouth daily before breakfast.   enalapril (VASOTEC) 2.5 MG tablet Take 1 tablet (2.5 mg total) by mouth daily.   glucose blood (TRUETEST TEST) test strip 1 each by Other route 2 (two) times daily. Use as instructed (This is for Ture test DX: E11.9)   Lancets Misc. MISC 1 each by Does not apply route 2 (two) times daily. (This is for ture test DX: E11.9)   loratadine (CLARITIN) 10 MG tablet Take 10 mg by mouth daily.   metFORMIN (GLUCOPHAGE-XR) 750 MG 24 hr tablet Take 1 tablet (750 mg total) by mouth in the morning and at bedtime.   metoprolol succinate (TOPROL-XL) 25 MG 24 hr tablet Take 1 tablet (25 mg total) by mouth daily.    Multiple Vitamin (MULTIVITAMIN) tablet Take 1 tablet by mouth daily.   nitroGLYCERIN (NITROSTAT) 0.4 MG SL tablet Place 1 tablet (0.4 mg total) under the tongue every 5 (five) minutes x 3 doses as needed for chest pain.   No facility-administered encounter medications on file as of 02/11/2021.        [place] Clinical Pharmacist Assistant [phone]

## 2021-02-13 ENCOUNTER — Other Ambulatory Visit (HOSPITAL_COMMUNITY): Payer: Self-pay

## 2021-02-13 ENCOUNTER — Other Ambulatory Visit: Payer: Self-pay

## 2021-02-13 ENCOUNTER — Ambulatory Visit (INDEPENDENT_AMBULATORY_CARE_PROVIDER_SITE_OTHER): Payer: PPO | Admitting: Pharmacist

## 2021-02-13 VITALS — BP 120/70

## 2021-02-13 DIAGNOSIS — E118 Type 2 diabetes mellitus with unspecified complications: Secondary | ICD-10-CM

## 2021-02-13 DIAGNOSIS — I152 Hypertension secondary to endocrine disorders: Secondary | ICD-10-CM

## 2021-02-13 DIAGNOSIS — I2510 Atherosclerotic heart disease of native coronary artery without angina pectoris: Secondary | ICD-10-CM

## 2021-02-13 MED ORDER — NITROGLYCERIN 0.4 MG SL SUBL
0.4000 mg | SUBLINGUAL_TABLET | SUBLINGUAL | 6 refills | Status: AC | PRN
Start: 2021-02-13 — End: ?
  Filled 2021-02-13: qty 25, 10d supply, fill #0

## 2021-02-13 NOTE — Patient Instructions (Signed)
Hi Jerome Rodriguez,  It was great to get to meet you in person! Below is a summary of some of the topics we discussed.   Please reach out to me if you have any questions or need anything before our follow up!  Best, Maddie  Jeni Salles, PharmD, [place] [phone]   Visit Information   Goals Addressed   None    Patient Care Plan: General Pharmacy (Adult)     Problem Identified: Problem: Hypertension, Hyperlipidemia, Diabetes, Coronary Artery Disease, Tobacco use, and Overactive Bladder      Long-Range Goal: Patient-Specific Goal   Start Date: 02/13/2021  Expected End Date: 02/13/2022  This Visit's Progress: On track  Priority: High  Note:   Current Barriers:  Unable to independently monitor therapeutic efficacy Unable to achieve control of cholesterol  Unable to maintain control of diabetes  Pharmacist Clinical Goal(s):  Patient will achieve adherence to monitoring guidelines and medication adherence to achieve therapeutic efficacy achieve control of cholesterol as evidenced by next lipid panel maintain control of diabetes as evidenced by A1c  through collaboration with PharmD and provider.   Interventions: 1:1 collaboration with Denita Lung, MD regarding development and update of comprehensive plan of care as evidenced by provider attestation and co-signature Inter-disciplinary care team collaboration (see longitudinal plan of care) Comprehensive medication review performed; medication list updated in electronic medical record  Hypertension (BP goal <130/80) -Controlled -Current treatment: Enalapril 2.5 mg 1 tablet daily - Appropriate, Effective, Safe, Accessible Metoprolol succinate 25 mg 1 tablet daily - Appropriate, Effective, Safe, Accessible -Medications previously tried: unknown  -Current home readings: usually around 100/60s; measures it at [facility] twice a week  -Current dietary habits: pay attention to salt intake;  usually buys unsalted; no added salt - very limited canned and frozen foods -Current exercise habits: walking or using stationary bike daily -Denies hypotensive/hypertensive symptoms -Educated on BP goals and benefits of medications for prevention of heart attack, stroke and kidney damage; Importance of home blood pressure monitoring; Proper BP monitoring technique; Symptoms of hypotension and importance of maintaining adequate hydration; -Counseled to monitor BP at home weekly, document, and provide log at future appointments -Counseled on diet and exercise extensively Recommended to continue current medication  Hyperlipidemia: (LDL goal < 55) -Uncontrolled -Current treatment: Atorvastatin 20 mg 1 tablet daily - Appropriate, Query effective, Safe, Accessible -Medications previously tried: atorvastatin (side effects with higher doses) -Current dietary patterns: grilled chicken often with salad; fried food once a week -Current exercise habits: back to walking daily -Educated on Cholesterol goals;  Benefits of statin for ASCVD risk reduction; Importance of limiting foods high in cholesterol; Exercise goal of 150 minutes per week; -Counseled on diet and exercise extensively Recommended to continue current medication Recommended repeat lipid panel and consider the addition of Zetia to target lower LDL.   CAD (Goal: prevent heart events) -Controlled -Current treatment  Atorvastatin 20 mg 1 tablet daily - Appropriate, Effective, Safe, Accessible Clopidogrel 75 mg 1 tablet daily - Appropriate, Effective, Safe, Accessible Aspirin 81 mg 1 tablet daily - Appropriate, Effective, Safe, Accessible Nitroglycerin 0.4 mg 1 tablet as needed  - Appropriate, Effective, Safe, Accessible -Medications previously tried: Effient (dizziness) -Recommended to continue current medication Counseled on avoidance of NSAIDs (Advil, Aleve, ibuprofen, etc.) while taking blood thinners. Recommended checking  expiration date on nitroglycerin and requested a refill for cardiology.  Diabetes (A1c goal <7%) -Uncontrolled -Current medications: Invokana 300 mg 1 tablet daily - Appropriate, Query effective, Safe, Query accessible Metformin  XR 750 mg 1 tablet twice daily - Appropriate, Query effective, Safe, Accessible -Medications previously tried: Geneticist, molecular (never started)  -Current home glucose readings fasting glucose: 140-150 (checking daily - feels symptoms with blood sugars in the low 100s) post prandial glucose: does not check -Reports hypoglycemic/hyperglycemic symptoms -Current meal patterns: practically 2 meals  breakfast: at 5-6pm lunch: n/a  dinner: 12am-1am; grilled chicken and salad snacks: not much snacking - cookies some drinks: water; coffee, coke zero or diet coke -Current exercise: walking daily or using the stationary bike daily -Educated on A1c and blood sugar goals; Benefits of routine self-monitoring of blood sugar; Continuous glucose monitoring; Carbohydrate counting and/or plate method -Counseled to check feet daily and get yearly eye exams -Counseled on diet and exercise extensively Recommended to continue current medication Recommended continuous glucose monitoring with Freestyle libre 3. Assessed patient finances. Patient will likely qualify for Jardiance PAP.  Allergic rhinitis (Goal: minimize symptoms) -Controlled -Current treatment  Claritin 10 mg 1 tablet daily - Appropriate, Effective, Safe, Accessible -Medications previously tried: none  -Recommended to continue current medication  Health Maintenance -Vaccine gaps: COVID booster, Prevnar 20 -Current therapy:  Multivitamin 1 tablet daily -Educated on Cost vs benefit of each product must be carefully weighed by individual consumer -Patient is satisfied with current therapy and denies issues -Recommended taking with food to help with saliva production.  Patient Goals/Self-Care Activities Patient will:   - check glucose daily, document, and provide at future appointments check blood pressure weekly, document, and provide at future appointments target a minimum of 150 minutes of moderate intensity exercise weekly  Follow Up Plan: The care management team will reach out to the patient again over the next 30 days.       [name] was given information about Chronic Care Management services today including:  CCM service includes personalized support from designated clinical staff supervised by his physician, including individualized plan of care and coordination with other care providers 24/7 contact phone numbers for assistance for urgent and routine care needs. Standard insurance, coinsurance, copays and deductibles apply for chronic care management only during months in which we provide at least 20 minutes of these services. Most insurances cover these services at 100%, however patients may be responsible for any copay, coinsurance and/or deductible if applicable. This service may help you avoid the need for more expensive face-to-face services. Only one practitioner may furnish and bill the service in a calendar month. The patient may stop CCM services at any time (effective at the end of the month) by phone call to the office staff.  Patient agreed to services and verbal consent obtained.   Patient verbalizes understanding of instructions and care plan provided today and agrees to view in [place]. Active MyChart status confirmed with patient.   The pharmacy team will reach out to the patient again over the next 30 days.   Viona Gilmore, [facility]

## 2021-02-13 NOTE — Progress Notes (Signed)
Chronic Care Management Pharmacy Note  02/13/2021 Name:  Jerome Rodriguez MRN:  [id number] DOB:  03-Jan-1956  Summary: LDL not at goal <55 A1c not at goal < 7% BP at goal < 130/80  Recommendations/Changes made from today's visit: -Recommend switching Invokana to Jardiance due to cost and ability to apply for Jardiance PAP -Recommend CGM with [employer] based on insurance coverage -Recommend repeat lipid panel and consider addition of ezetimibe if LDL is not < 55 -Requested nitroglycerin refill from cardiology  Plan: Follow up once patient obtains CGM  Subjective: Jerome Rodriguez is an 66 y.o. year old male who is a primary patient of Denita Lung, MD.  The CCM team was consulted for assistance with disease management and care coordination needs.    Engaged with patient face to face for initial visit in response to provider referral for pharmacy case management and/or care coordination services.   Consent to Services:  The patient was given the following information about Chronic Care Management services today, agreed to services, and gave verbal consent: 1. CCM service includes personalized support from designated clinical staff supervised by the primary care provider, including individualized plan of care and coordination with other care providers 2. 24/7 contact phone numbers for assistance for urgent and routine care needs. 3. Service will only be billed when office clinical staff spend 20 minutes or more in a month to coordinate care. 4. Only one practitioner may furnish and bill the service in a calendar month. 5.The patient may stop CCM services at any time (effective at the end of the month) by phone call to the office staff. 6. The patient will be responsible for cost sharing (co-pay) of up to 20% of the service fee (after annual deductible is met). Patient agreed to services and consent obtained.  Patient Care Team: Denita Lung, MD as PCP - General (Family  Medicine) Sherren Mocha, MD as PCP - Cardiology (Cardiology) Sharmon Revere as Physician Assistant (Cardiology) Viona Gilmore, [facility] as Pharmacist (Pharmacist)  Recent office visits: 10/04/20 Denita Lung, MD - Patient presented for Atherosclerosis of aorta and other concerns. No medication changes.  Recent consult visits: 01/30/21 Sheran Luz (Audiology) - Patient presented for Mixed conductive and sensorineural hearing loss of left ear with restricted hearing of right ear and other concerns. No medication changes.   08/30/20 Sharmon Revere (Cardiology) - Patient presented for Coronary artery disease involving native coronary artery of native heart without angina pectoris and other concerns. No medication changes.  Hospital visits: None in previous 6 months   Objective:  Lab Results  Component Value Date   CREATININE 1.07 06/04/2020   BUN 14 06/04/2020   EGFR 77 06/04/2020   GFRNONAA 69 05/30/2019   GFRAA 80 05/30/2019   NA 137 06/04/2020   K 4.9 06/04/2020   CALCIUM 9.4 06/04/2020   CO2 20 06/04/2020   GLUCOSE 159 (H) 06/04/2020    Lab Results  Component Value Date/Time   HGBA1C 7.5 (A) 10/04/2020 11:48 AM   HGBA1C 7.6 (A) 06/04/2020 10:38 AM   HGBA1C 7.4 (H) 07/10/2012 09:09 AM   MICROALBUR 11.0 02/22/2018 11:34 AM   MICROALBUR 5.0 02/01/2017 04:49 PM    Last diabetic Eye exam:  Lab Results  Component Value Date/Time   HMDIABEYEEXA no diabetic rentinopathy 02/23/2012 12:00 AM    Last diabetic Foot exam: No results found for: HMDIABFOOTEX   Lab Results  Component Value Date   CHOL 158  06/04/2020   HDL 43 06/04/2020   LDLCALC 95 06/04/2020   TRIG 108 06/04/2020   CHOLHDL 3.7 06/04/2020    Hepatic Function Latest Ref Rng & Units 06/04/2020 05/30/2019 02/22/2018  Total Protein 6.0 - 8.5 g/dL 6.9 6.4 6.6  Albumin 3.8 - 4.8 g/dL 4.2 4.4 4.5  AST 0 - 40 IU/L _0 ALT 0 - 44 IU/L _1 Alk Phosphatase 44 - 121 IU/L 141(H)  136(H) 136(H)  Total Bilirubin 0.0 - 1.2 mg/dL 0.6 0.6 0.4  Bilirubin, Direct 0.0 - 0.3 mg/dL - - -    Lab Results  Component Value Date/Time   TSH 7.079 (H) 07/12/2012 09:20 AM   TSH 10.120 (H) 07/10/2012 09:09 AM   FREET4 1.17 07/12/2012 09:20 AM    CBC Latest Ref Rng & Units 06/04/2020 05/30/2019 02/22/2018  WBC 3.4 - 10.8 x10E3/uL 8.6 8.1 8.0  Hemoglobin 13.0 - 17.7 g/dL 16.0 14.6 14.6  Hematocrit 37.5 - 51.0 % 48.0 44.1 43.4  Platelets 150 - 450 x10E3/uL 249 244 256    Lab Results  Component Value Date/Time   VD25OH 46 07/16/2011 10:58 AM    Clinical ASCVD: Yes  The 10-year ASCVD risk score (Arnett DK, et al., 2019) is: 23.8%   Values used to calculate the score:     Age: 37 years     Sex: Male     Is Non-Hispanic African American: No     Diabetic: Yes     Tobacco smoker: Yes     Systolic Blood Pressure: 876 mmHg     Is BP treated: Yes     HDL Cholesterol: 43 mg/dL     Total Cholesterol: 158 mg/dL    Depression screen [facility] 2/9 06/04/2020 05/30/2019 10/22/2017  Decreased Interest 0 0 0  Down, Depressed, Hopeless 0 0 0  PHQ - 2 Score 0 0 0     Social History   Tobacco Use  Smoking Status Light Smoker   Types: Cigars  Smokeless Tobacco Never  Tobacco Comments   smoke 1--d for 25 years; quit 6 months ago. is now smoking ciagrs occasionally    BP Readings from Last 3 Encounters:  10/04/20 100/66  08/30/20 (!) 112/50  06/27/20 100/62   Pulse Readings from Last 3 Encounters:  10/04/20 65  08/30/20 77  06/04/20 74   Wt Readings from Last 3 Encounters:  10/04/20 197 lb 12.8 oz (89.7 kg)  08/30/20 194 lb (88 kg)  06/27/20 202 lb (91.6 kg)   BMI Readings from Last 3 Encounters:  10/04/20 30.52 kg/m  08/30/20 29.94 kg/m  06/27/20 31.40 kg/m    Assessment/Interventions: Review of patient past medical history, allergies, medications, health status, including review of consultants reports, laboratory and other test data, was performed as part of  comprehensive evaluation and provision of chronic care management services.   SDOH:  (Social Determinants of Health) assessments and interventions performed: Yes  SDOH Screenings   Alcohol Screen: Not on file  Depression (PHQ2-9): Low Risk    PHQ-2 Score: 0  Financial Resource Strain: Not on file  Food Insecurity: Not on file  Housing: Not on file  Physical Activity: Not on file  Social Connections: Not on file  Stress: Not on file  Tobacco Use: High Risk   Smoking Tobacco Use: Light Smoker   Smokeless Tobacco Use: Never   Passive Exposure: Not on file  Transportation Needs: Not on file   Patient is walking for an hour every day or  stationary bike for 1 hour when the weather is bad. 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 Patient likes to drive at night and starts after he eats dinner from 9-10 until 2-3am. He reports it quiet and the streets are empty and really likes it at night. He usually drives Monday, Wed, Thurs, Fri, Sat, Sun. He likes to do errands on Tuesdays. He watches a lot of TV during the day. He is usually waking up at 4-5 pm and then goes to bed at 4-5 am.  He sleeps better during the day during the night. He is used to staying up at night. Patient sleeps 8 hours on good nights and some nights he is woken up by his ex-wife's (landlord) dogs. Patient reports they get along ok. Patient reports no issues with sleep other than the barking. He does snore and falls asleep pretty quickly and does feel rested during the day.  He also enjoys visits with friends as well.  Patient was spitting up his multivitamin and this happened several days in a row. Patient denies issues with side effects otherwise.   CCM Care Plan  Allergies  Allergen Reactions   Effient [Prasugrel] Other (See Comments)    dizziness   Lactose Intolerance (Gi)     Medications Reviewed Today     Reviewed by Viona Gilmore, [facility] (Pharmacist) on 02/13/21 at 1428   Med List Status: <None>   Medication Order Taking? Sig Documenting Provider Last Dose Status Informant  aspirin 81 MG EC tablet [id number] Yes Take 1 tablet (81 mg total) by mouth daily. Theora Gianotti, NP Taking Active Self  atorvastatin (LIPITOR) 20 MG tablet [id number] Yes Take 1 tablet (20 mg total) by mouth daily. Denita Lung, MD Taking Active   clopidogrel (PLAVIX) 75 MG tablet [id number] Yes Take 1 tablet (75 mg total) by mouth daily. Denita Lung, MD Taking Active   empagliflozin (JARDIANCE) 25 MG TABS tablet [id number] No Take 1 tablet (25 mg total) by mouth daily before breakfast.  Patient not taking: Reported on 02/13/2021   Denita Lung, MD Not Taking Active   enalapril (VASOTEC) 2.5 MG tablet [id number] Yes Take 1 tablet (2.5 mg total) by mouth daily. Denita Lung, MD Taking Active   glucose blood (TRUETEST TEST) test strip [id number]  1 each by Other route 2 (two) times daily. Use as instructed (This is for Ture test DX: E11.9) Denita Lung, MD  Active Self  Lancets Misc. [place] [id number]  1 each by Does not apply route 2 (two) times daily. (This is for ture test DX: E11.9) Denita Lung, MD  Active Self  loratadine (CLARITIN) 10 MG tablet [id number] Yes Take 10 mg by mouth daily. [provider] Taking Active Self  metFORMIN (GLUCOPHAGE-XR) 750 MG 24 hr tablet [id number] Yes Take 1 tablet (750 mg total) by mouth in the morning and at bedtime. Denita Lung, MD Taking Active   metoprolol succinate (TOPROL-XL) 25 MG 24 hr tablet [id number] Yes Take 1 tablet (25 mg total) by mouth daily. Denita Lung, MD Taking Active   Multiple Vitamin (MULTIVITAMIN) tablet [id number] No Take 1 tablet by mouth daily.  Patient not taking: Reported on 02/13/2021   [provider] Not Taking Active Self  nitroGLYCERIN (NITROSTAT) 0.4 MG SL tablet [id number] No Place 1 tablet (0.4 mg total) under the tongue every 5 (five) minutes x 3 doses as needed for chest pain.  Patient  not taking: Reported on 02/13/2021   Sherren Mocha, MD Not Taking Active Self            Patient Active Problem List   Diagnosis Date Noted   OAB (overactive bladder) 06/04/2020   Open-angle glaucoma of both eyes, mild stage 06/04/2020   Presbycusis of left ear 06/04/2020   Renal stones 10/05/2019   Diabetic nephropathy with proteinuria ([place]) 05/31/2019   Positive PPD, treated 07/15/2012   Tobacco abuse 07/10/2012   Abnormal chest x-ray 07/10/2012   Obesity (BMI 30-39.9) 07/16/2011   ASHD (arteriosclerotic heart disease) 06/24/2010   Type 2 diabetes with complication ([place]) 24/40/1027   ED (erectile dysfunction) 06/24/2010   Hyperlipidemia associated with type 2 diabetes mellitus ([place]) 04/03/2008   Hypertension associated with diabetes ([place]) 04/03/2008    Immunization History  Administered Date(s) Administered   Influenza Split 11/17/2010   PFIZER Comirnaty(Gray Top)Covid-19 Tri-Sucrose Vaccine 06/04/2020   PFIZER(Purple Top)SARS-COV-2 Vaccination 03/27/2019, 04/19/2019, 10/19/2019   Pneumococcal Conjugate-13 07/30/2014   Pneumococcal Polysaccharide-23 05/04/2007   Tdap 05/04/2007, 09/14/2019   Zoster Recombinat (Shingrix) 09/14/2019, 11/24/2019   Zoster, Live 07/30/2014    Conditions to be addressed/monitored:  Hypertension, Hyperlipidemia, Diabetes, Coronary Artery Disease, Tobacco use, and Overactive Bladder  Care Plan : General Pharmacy (Adult)  Updates made by Viona Gilmore, [place] since 02/13/2021 12:00 AM     Problem: Problem: Hypertension, Hyperlipidemia, Diabetes, Coronary Artery Disease, Tobacco use, and Overactive Bladder      Long-Range Goal: Patient-Specific Goal   Start Date: 02/13/2021  Expected End Date: 02/13/2022  This Visit's Progress: On track  Priority: High  Note:   Current Barriers:  Unable to independently monitor therapeutic efficacy Unable to achieve control of cholesterol  Unable to maintain control of diabetes  Pharmacist Clinical  Goal(s):  Patient will achieve adherence to monitoring guidelines and medication adherence to achieve therapeutic efficacy achieve control of cholesterol as evidenced by next lipid panel maintain control of diabetes as evidenced by A1c  through collaboration with PharmD and provider.   Interventions: 1:1 collaboration with Denita Lung, MD regarding development and update of comprehensive plan of care as evidenced by provider attestation and co-signature Inter-disciplinary care team collaboration (see longitudinal plan of care) Comprehensive medication review performed; medication list updated in electronic medical record  Hypertension (BP goal <130/80) -Controlled -Current treatment: Enalapril 2.5 mg 1 tablet daily - Appropriate, Effective, Safe, Accessible Metoprolol succinate 25 mg 1 tablet daily - Appropriate, Effective, Safe, Accessible -Medications previously tried: unknown  -Current home readings: usually around 100/60s; measures it at [facility] twice a week  -Current dietary habits: pay attention to salt intake; usually buys unsalted; no added salt - very limited canned and frozen foods -Current exercise habits: walking or using stationary bike daily -Denies hypotensive/hypertensive symptoms -Educated on BP goals and benefits of medications for prevention of heart attack, stroke and kidney damage; Importance of home blood pressure monitoring; Proper BP monitoring technique; Symptoms of hypotension and importance of maintaining adequate hydration; -Counseled to monitor BP at home weekly, document, and provide log at future appointments -Counseled on diet and exercise extensively Recommended to continue current medication  Hyperlipidemia: (LDL goal < 55) -Uncontrolled -Current treatment: Atorvastatin 20 mg 1 tablet daily - Appropriate, Query effective, Safe, Accessible -Medications previously tried: atorvastatin (side effects with higher doses) -Current dietary patterns:  grilled chicken often with salad; fried food once a week -Current exercise habits: back to walking daily -Educated on Cholesterol goals;  Benefits of statin for ASCVD risk reduction; Importance of  limiting foods high in cholesterol; Exercise goal of 150 minutes per week; -Counseled on diet and exercise extensively Recommended to continue current medication Recommended repeat lipid panel and consider the addition of Zetia to target lower LDL.   CAD (Goal: prevent heart events) -Controlled -Current treatment  Atorvastatin 20 mg 1 tablet daily - Appropriate, Effective, Safe, Accessible Clopidogrel 75 mg 1 tablet daily - Appropriate, Effective, Safe, Accessible Aspirin 81 mg 1 tablet daily - Appropriate, Effective, Safe, Accessible Nitroglycerin 0.4 mg 1 tablet as needed  - Appropriate, Effective, Safe, Accessible -Medications previously tried: Effient (dizziness) -Recommended to continue current medication Counseled on avoidance of NSAIDs (Advil, Aleve, ibuprofen, etc.) while taking blood thinners. Recommended checking expiration date on nitroglycerin and requested a refill for cardiology.  Diabetes (A1c goal <7%) -Uncontrolled -Current medications: Invokana 300 mg 1 tablet daily - Appropriate, Query effective, Safe, Query accessible Metformin XR 750 mg 1 tablet twice daily - Appropriate, Query effective, Safe, Accessible -Medications previously tried: Geneticist, molecular (never started)  -Current home glucose readings fasting glucose: 140-150 (checking daily - feels symptoms with blood sugars in the low 100s) post prandial glucose: does not check -Reports hypoglycemic/hyperglycemic symptoms -Current meal patterns: practically 2 meals  breakfast: at 5-6pm lunch: n/a  dinner: 12am-1am; grilled chicken and salad snacks: not much snacking - cookies some drinks: water; coffee, coke zero or diet coke -Current exercise: walking daily or using the stationary bike daily -Educated on A1c and blood  sugar goals; Benefits of routine self-monitoring of blood sugar; Continuous glucose monitoring; Carbohydrate counting and/or plate method -Counseled to check feet daily and get yearly eye exams -Counseled on diet and exercise extensively Recommended to continue current medication Recommended continuous glucose monitoring with Freestyle libre 3. Assessed patient finances. Patient will likely qualify for Jardiance PAP.  Allergic rhinitis (Goal: minimize symptoms) -Controlled -Current treatment  Claritin 10 mg 1 tablet daily - Appropriate, Effective, Safe, Accessible -Medications previously tried: none  -Recommended to continue current medication  Health Maintenance -Vaccine gaps: COVID booster, Prevnar 20 -Current therapy:  Multivitamin 1 tablet daily -Educated on Cost vs benefit of each product must be carefully weighed by individual consumer -Patient is satisfied with current therapy and denies issues -Recommended taking with food to help with saliva production.  Patient Goals/Self-Care Activities Patient will:  - check glucose daily, document, and provide at future appointments check blood pressure weekly, document, and provide at future appointments target a minimum of 150 minutes of moderate intensity exercise weekly  Follow Up Plan: The care management team will reach out to the patient again over the next 30 days.         Medication Assistance: Application for Jardiance  medication assistance program. in process.  Anticipated assistance start date 03/13/21.  See plan of care for additional detail.  Compliance/Adherence/Medication fill history: Care Gaps: COVID booster BP- 100/66 (10/04/20) A1c 7.5% (10/04/20)  Star-Rating Drugs: Atorvastatin (Lipitor) 20 mg - Last filled 12/24/20 90 DS at [facility]  Enalapril (Vasotec) 2.5 mg - Last filled 12/24/20 90 DS at [facility] Metformin 750 mg - Last filled 10/23/20 90 DS at [place]   Patient's  preferred pharmacy is:  [facility] [address] [place] [address] Phone: [phone] Fax: [phone]  [facility] [place] [place] [address] Phone: [phone] Fax: [place] ([place]), [place] - [address]  [place]) [place] [address] Phone: [phone] Fax: [phone]  Uses pill box? No - pattern - when he takes his tablet he fills it for the next day Pt endorses 100% compliance  We discussed: Current pharmacy is preferred with insurance plan and patient is satisfied with pharmacy services Patient decided to: Continue current medication management strategy  Care Plan and Follow Up Patient Decision:  Patient agrees to Care Plan and Follow-up.  Plan: The care management team will reach out to the patient again over the next 30 days.  Jeni Salles, PharmD, [place] Pharmacist [facility] [place]

## 2021-02-13 NOTE — Telephone Encounter (Signed)
Pt's medication was sent to pt's pharmacy as requested. Confirmation received.  °

## 2021-03-03 ENCOUNTER — Other Ambulatory Visit (HOSPITAL_COMMUNITY): Payer: Self-pay

## 2021-03-03 ENCOUNTER — Other Ambulatory Visit: Payer: Self-pay | Admitting: Family Medicine

## 2021-03-03 DIAGNOSIS — I2510 Atherosclerotic heart disease of native coronary artery without angina pectoris: Secondary | ICD-10-CM

## 2021-03-03 MED ORDER — CLOPIDOGREL BISULFATE 75 MG PO TABS
75.0000 mg | ORAL_TABLET | Freq: Every day | ORAL | 0 refills | Status: DC
  Filled 2021-03-03: qty 90, 90d supply, fill #0

## 2021-03-04 ENCOUNTER — Other Ambulatory Visit (HOSPITAL_COMMUNITY): Payer: Self-pay

## 2021-03-04 DIAGNOSIS — E1159 Type 2 diabetes mellitus with other circulatory complications: Secondary | ICD-10-CM | POA: Diagnosis not present

## 2021-03-04 DIAGNOSIS — I152 Hypertension secondary to endocrine disorders: Secondary | ICD-10-CM | POA: Diagnosis not present

## 2021-03-21 ENCOUNTER — Ambulatory Visit (INDEPENDENT_AMBULATORY_CARE_PROVIDER_SITE_OTHER): Payer: PPO

## 2021-03-21 VITALS — BP 110/60 | HR 75 | Temp 98.2°F | Ht 67.5 in | Wt 200.0 lb

## 2021-03-21 DIAGNOSIS — Z0000 Encounter for general adult medical examination without abnormal findings: Secondary | ICD-10-CM

## 2021-03-21 NOTE — Progress Notes (Signed)
?This visit occurred during the SARS-CoV-2 public health emergency.  Safety protocols were in place, including screening questions prior to the visit, additional usage of staff PPE, and extensive cleaning of exam room while observing appropriate contact time as indicated for disinfecting solutions. ? ?Subjective:  ? Jerome Rodriguez is a 66 y.o. male who presents for Medicare Annual/Subsequent preventive examination. ? ?Review of Systems    ? ?Cardiac Risk Factors include: advanced age (>93mn, >>53women);diabetes mellitus;dyslipidemia;hypertension;male gender ? ?   ?Objective:  ?  ?Today's Vitals  ? 03/21/21 1503  ?BP: 110/60  ?Pulse: 75  ?Temp: 98.2 ?F (36.8 ?C)  ?TempSrc: Oral  ?SpO2: 96%  ?Weight: 200 lb (90.7 kg)  ?Height: 5' 7.5" (1.715 m)  ? ?Body mass index is 30.86 kg/m?. ? ?Advanced Directives 03/21/2021 06/04/2020 07/28/2019 07/20/2019 05/30/2019 07/10/2012  ?Does Patient Have a Medical Advance Directive? No Yes No No No Patient does not have advance directive  ?Type of Advance Directive - Living will - - - -  ?Does patient want to make changes to medical advance directive? - No - Patient declined - - - -  ?Would patient like information on creating a medical advance directive? Yes (MAU/Ambulatory/Procedural Areas - Information given) - No - Patient declined No - Patient declined Yes (MAU/Ambulatory/Procedural Areas - Information given) -  ?Pre-existing out of facility DNR order (yellow form or pink MOST form) - - - - - No  ? ? ?Current Medications (verified) ?Outpatient Encounter Medications as of 03/21/2021  ?Medication Sig  ? aspirin 81 MG EC tablet Take 1 tablet (81 mg total) by mouth daily.  ? atorvastatin (LIPITOR) 20 MG tablet Take 1 tablet (20 mg total) by mouth daily.  ? clopidogrel (PLAVIX) 75 MG tablet Take 1 tablet (75 mg total) by mouth daily.  ? empagliflozin (JARDIANCE) 25 MG TABS tablet Take 1 tablet (25 mg total) by mouth daily before breakfast.  ? enalapril (VASOTEC) 2.5 MG tablet Take 1  tablet (2.5 mg total) by mouth daily.  ? glucose blood (TRUETEST TEST) test strip 1 each by Other route 2 (two) times daily. Use as instructed (This is for Ture test DX: E11.9)  ? Lancets Misc. MISC 1 each by Does not apply route 2 (two) times daily. (This is for ture test DX: E11.9)  ? loratadine (CLARITIN) 10 MG tablet Take 10 mg by mouth daily.  ? metFORMIN (GLUCOPHAGE-XR) 750 MG 24 hr tablet Take 1 tablet (750 mg total) by mouth in the morning and at bedtime.  ? metoprolol succinate (TOPROL-XL) 25 MG 24 hr tablet Take 1 tablet (25 mg total) by mouth daily.  ? nitroGLYCERIN (NITROSTAT) 0.4 MG SL tablet Place 1 tablet (0.4 mg total) under the tongue every 5 (five) minutes x 3 doses as needed for chest pain.  ? canagliflozin (INVOKANA) 300 MG TABS tablet Take 300 mg by mouth daily before breakfast. (Patient not taking: Reported on 03/21/2021)  ? Multiple Vitamin (MULTIVITAMIN) tablet Take 1 tablet by mouth daily. (Patient not taking: Reported on 02/13/2021)  ? ?No facility-administered encounter medications on file as of 03/21/2021.  ? ? ?Allergies (verified) ?Effient [prasugrel] and Lactose intolerance (gi)  ? ?History: ?Past Medical History:  ?Diagnosis Date  ? Abnormal chest CT   ? a. 07/2012: scattered bilat noncalcified pulm nodules ranging in size from a few mm to a max of 842min LLL - rec f/u in 3-6 mos.  ? CAD (coronary artery disease)   ? a. NSTEMI 10/04 => LHC: mLAD 99%=>  Taxus DES to mLAD and Taxus DES to dLAD;  b. ETT-Myoview 11/2011: normal, EF 73%, no ischemia; c. 07/2012 Cath/PCI: LM <10, LAD 90 ISRp/99 ISRd (3.0x38 Promus DES), LCX 45m 30-40d, RCA 50-60p, 30d, EF 55-65%.  ? Diabetes mellitus   ? Dyslipidemia   ? ED (erectile dysfunction)   ? History of kidney stones   ? HTN (hypertension)   ? Kidney stone   ? Myocardial infarction ([facility]   ? Obesity   ? Smoker   ? CIGARS  ? ?Past Surgical History:  ?Procedure Laterality Date  ? APPENDECTOMY  1976  ? arthroscopic knee surgery Right   ? CORONARY STENT  PLACEMENT    ? CYSTOSCOPY/URETEROSCOPY/HOLMIUM LASER/STENT PLACEMENT Left 08/04/2019  ? Procedure: CYSTOSCOPY/URETEROSCOPY/HOLMIUM LASER/STENT PLACEMENT;  Surgeon: SBilley Co MD;  Location: [facility] ORS;  Service: Urology;  Laterality: Left;  ? LEFT HEART CATHETERIZATION WITH CORONARY ANGIOGRAM N/A 07/11/2012  ? Procedure: LEFT HEART CATHETERIZATION WITH CORONARY ANGIOGRAM;  Surgeon: Peter M JMartinique MD;  [facility];  Service: Cardiovascular;  Laterality: N/A;  ? right shoulder surgery    ? ?Family History  ?Problem Relation Age of Onset  ? Heart attack Mother   ?     died in sleep  ? Diabetic kidney disease Mother   ? Diabetic kidney disease Father   ? Heart attack Father   ? Heart failure Father   ? ?Social History  ? ?Socioeconomic History  ? Marital status: Single  ?  Spouse name: Not on file  ? Number of children: Not on file  ? Years of education: Not on file  ? Highest education level: Not on file  ?Occupational History  ? Occupation: taxi driver  ?Tobacco Use  ? Smoking status: Light Smoker  ?  Types: Cigars  ? Smokeless tobacco: Never  ? Tobacco comments:  ?  smoke 1--d for 25 years; quit 6 months ago. is now smoking ciagrs occasionally   ?Vaping Use  ? Vaping Use: Never used  ?Substance and Sexual Activity  ? Alcohol use: No  ?  Comment: rare   ? Drug use: No  ? Sexual activity: Yes  ?  Birth control/protection: None  ?Other Topics Concern  ? Not on file  ?Social History Narrative  ? Married; full time.  ? ?Social Determinants of Health  ? ?Financial Resource Strain: Low Risk   ? Difficulty of Paying Living Expenses: Not hard at all  ?Food Insecurity: No Food Insecurity  ? Worried About RCharity fundraiserin the Last Year: Never true  ? Ran Out of Food in the Last Year: Never true  ?Transportation Needs: No Transportation Needs  ? Lack of Transportation (Medical): No  ? Lack of Transportation (Non-Medical): No  ?Physical Activity: Sufficiently Active  ? Days of Exercise per Week: 7 days  ?  Minutes of Exercise per Session: 60 min  ?Stress: No Stress Concern Present  ? Feeling of Stress : Not at all  ?Social Connections: Not on file  ? ? ?Tobacco Counseling ?Ready to quit: Not Answered ?Counseling given: Not Answered ?Tobacco comments: smoke 1--d for 25 years; quit 6 months ago. is now smoking ciagrs occasionally  ? ? ?Clinical Intake: ? ?Pre-visit preparation completed: Yes ? ?Pain : No/denies pain ? ?  ? ?Nutritional Status: BMI > 30  Obese ?Nutritional Risks: None ?Diabetes: Yes ? ?How often do you need to have someone help you when you read instructions, pamphlets, or other written materials from your doctor or pharmacy?:  1 - Never ? ?Diabetic? Yes ?Nutrition Risk Assessment: ? ?Has the patient had any N/V/D within the last 2 months?  No  ?Does the patient have any non-healing wounds?  No  ?Has the patient had any unintentional weight loss or weight gain?  No  ? ?Diabetes: ? ?Is the patient diabetic?  Yes  ?If diabetic, was a CBG obtained today?  No  ?Did the patient bring in their glucometer from home?  No  ?How often do you monitor your CBG's? Does not waiting for libre.  ? ?Financial Strains and Diabetes Management: ? ?Are you having any financial strains with the device, your supplies or your medication? No .  ?Does the patient want to be seen by Chronic Care Management for management of their diabetes?  No  ?Would the patient like to be referred to a Nutritionist or for Diabetic Management?  No  ? ?Diabetic Exams: ? ?Diabetic Eye Exam: Completed 06/26/2020 ?Diabetic Foot Exam: Completed 06/04/2020 ? ? ?Interpreter Needed?: No ? ?Information entered by :: NAllen LPN ? ? ?Activities of Daily Living ?In your present state of health, do you have any difficulty performing the following activities: 03/21/2021 06/04/2020  ?Hearing? Y Y  ?Comment hearing aide left ear left ear  ?Vision? N N  ?Difficulty concentrating or making decisions? N N  ?Walking or climbing stairs? N N  ?Dressing or bathing? N N   ?Doing errands, shopping? N N  ?Preparing Food and eating ? N N  ?Using the Toilet? N N  ?In the past six months, have you accidently leaked urine? N Y  ?Do you have problems with loss of bowel control? N N  ?[place]

## 2021-03-21 NOTE — Patient Instructions (Signed)
[name] , ?Thank you for taking time to come for your Medicare Wellness Visit. I appreciate your ongoing commitment to your health goals. Please review the following plan we discussed and let me know if I can assist you in the future.  ? ?Screening recommendations/referrals: ?Colonoscopy: cologuard 06/25/2020, due 06/26/2023 ?Recommended yearly ophthalmology/optometry visit for glaucoma screening and checkup ?Recommended yearly dental visit for hygiene and checkup ? ?Vaccinations: ?Influenza vaccine: declines ?Pneumococcal vaccine: completed 07/30/2014 ?Tdap vaccine: completed 09/14/2019, due 09/13/2029 ?Shingles vaccine: completed   ?Covid-19:  06/04/2020, 10/19/2019, 04/19/2019, 03/27/2019 ? ?Advanced directives: Advance directive discussed with you today. I have provided a copy for you to complete at home and have notarized. Once this is complete please bring a copy in to our office so we can scan it into your chart. ? ? ?Conditions/risks identified: none ? ?Next appointment: Follow up in one year for your annual wellness visit.  ? ?Preventive Care 67 Years and Older, Male ?Preventive care refers to lifestyle choices and visits with your health care provider that can promote health and wellness. ?What does preventive care include? ?A yearly physical exam. This is also called an annual well check. ?Dental exams once or twice a year. ?Routine eye exams. Ask your health care provider how often you should have your eyes checked. ?Personal lifestyle choices, including: ?Daily care of your teeth and gums. ?Regular physical activity. ?Eating a healthy diet. ?Avoiding tobacco and drug use. ?Limiting alcohol use. ?Practicing safe sex. ?Taking low doses of aspirin every day. ?Taking vitamin and mineral supplements as recommended by your health care provider. ?What happens during an annual well check? ?The services and screenings done by your health care provider during your annual well check will depend on your age, overall  health, lifestyle risk factors, and family history of disease. ?Counseling  ?Your health care provider may ask you questions about your: ?Alcohol use. ?Tobacco use. ?Drug use. ?Emotional well-being. ?Home and relationship well-being. ?Sexual activity. ?Eating habits. ?History of falls. ?Memory and ability to understand (cognition). ?Work and work Statistician. ?Screening  ?You may have the following tests or measurements: ?Height, weight, and BMI. ?Blood pressure. ?Lipid and cholesterol levels. These may be checked every 5 years, or more frequently if you are over 60 years old. ?Skin check. ?Lung cancer screening. You may have this screening every year starting at age 67 if you have a 30-pack-year history of smoking and currently smoke or have quit within the past 15 years. ?Fecal occult blood test (FOBT) of the stool. You may have this test every year starting at age 67. ?Flexible sigmoidoscopy or colonoscopy. You may have a sigmoidoscopy every 5 years or a colonoscopy every 10 years starting at age 67. ?Prostate cancer screening. Recommendations will vary depending on your family history and other risks. ?Hepatitis C blood test. ?Hepatitis B blood test. ?Sexually transmitted disease (STD) testing. ?Diabetes screening. This is done by checking your blood sugar (glucose) after you have not eaten for a while (fasting). You may have this done every 1-3 years. ?Abdominal aortic aneurysm (AAA) screening. You may need this if you are a current or former smoker. ?Osteoporosis. You may be screened starting at age 67 if you are at high risk. ?Talk with your health care provider about your test results, treatment options, and if necessary, the need for more tests. ?Vaccines  ?Your health care provider may recommend certain vaccines, such as: ?Influenza vaccine. This is recommended every year. ?Tetanus, diphtheria, and acellular pertussis (Tdap, Td) vaccine. You may  need a Td booster every 10 years. ?Zoster vaccine. You may  need this after age 77. ?Pneumococcal 13-valent conjugate (PCV13) vaccine. One dose is recommended after age 67. ?Pneumococcal polysaccharide (PPSV23) vaccine. One dose is recommended after age 62. ?Talk to your health care provider about which screenings and vaccines you need and how often you need them. ?This information is not intended to replace advice given to you by your health care provider. Make sure you discuss any questions you have with your health care provider. ?Document Released: 01/18/2015 Document Revised: 09/11/2015 Document Reviewed: 10/23/2014 ?Elsevier Interactive Patient Education ? 2017 May. ? ?Fall Prevention in the Home ?Falls can cause injuries. They can happen to people of all ages. There are many things you can do to make your home safe and to help prevent falls. ?What can I do on the outside of my home? ?Regularly fix the edges of walkways and driveways and fix any cracks. ?Remove anything that might make you trip as you walk through a door, such as a raised step or threshold. ?Trim any bushes or trees on the path to your home. ?Use bright outdoor lighting. ?Clear any walking paths of anything that might make someone trip, such as rocks or tools. ?Regularly check to see if handrails are loose or broken. Make sure that both sides of any steps have handrails. ?Any raised decks and porches should have guardrails on the edges. ?Have any leaves, snow, or ice cleared regularly. ?Use sand or salt on walking paths during winter. ?Clean up any spills in your garage right away. This includes oil or grease spills. ?What can I do in the bathroom? ?Use night lights. ?Install grab bars by the toilet and in the tub and shower. Do not use towel bars as grab bars. ?Use non-skid mats or decals in the tub or shower. ?If you need to sit down in the shower, use a plastic, non-slip stool. ?Keep the floor dry. Clean up any water that spills on the floor as soon as it happens. ?Remove soap buildup in  the tub or shower regularly. ?Attach bath mats securely with double-sided non-slip rug tape. ?Do not have throw rugs and other things on the floor that can make you trip. ?What can I do in the bedroom? ?Use night lights. ?Make sure that you have a light by your bed that is easy to reach. ?Do not use any sheets or blankets that are too big for your bed. They should not hang down onto the floor. ?Have a firm chair that has side arms. You can use this for support while you get dressed. ?Do not have throw rugs and other things on the floor that can make you trip. ?What can I do in the kitchen? ?Clean up any spills right away. ?Avoid walking on wet floors. ?Keep items that you use a lot in easy-to-reach places. ?If you need to reach something above you, use a strong step stool that has a grab bar. ?Keep electrical cords out of the way. ?Do not use floor polish or wax that makes floors slippery. If you must use wax, use non-skid floor wax. ?Do not have throw rugs and other things on the floor that can make you trip. ?What can I do with my stairs? ?Do not leave any items on the stairs. ?Make sure that there are handrails on both sides of the stairs and use them. Fix handrails that are broken or loose. Make sure that handrails are as long as the stairways. ?  Check any carpeting to make sure that it is firmly attached to the stairs. Fix any carpet that is loose or worn. ?Avoid having throw rugs at the top or bottom of the stairs. If you do have throw rugs, attach them to the floor with carpet tape. ?Make sure that you have a light switch at the top of the stairs and the bottom of the stairs. If you do not have them, ask someone to add them for you. ?What else can I do to help prevent falls? ?Wear shoes that: ?Do not have high heels. ?Have rubber bottoms. ?Are comfortable and fit you well. ?Are closed at the toe. Do not wear sandals. ?If you use a stepladder: ?Make sure that it is fully opened. Do not climb a closed  stepladder. ?Make sure that both sides of the stepladder are locked into place. ?Ask someone to hold it for you, if possible. ?Clearly mark and make sure that you can see: ?Any grab bars or handrails. ?First and last

## 2021-03-27 ENCOUNTER — Ambulatory Visit
Admission: RE | Admit: 2021-03-27 | Discharge: 2021-03-27 | Disposition: A | Discharge status: Home / Self Care | Payer: PPO | Source: Ambulatory Visit | Attending: Urology | Admitting: Urology

## 2021-03-27 ENCOUNTER — Ambulatory Visit: Payer: PPO | Admitting: Urology

## 2021-03-27 ENCOUNTER — Other Ambulatory Visit: Payer: Self-pay

## 2021-03-27 ENCOUNTER — Encounter: Payer: Self-pay | Admitting: Urology

## 2021-03-27 ENCOUNTER — Ambulatory Visit
Admission: RE | Admit: 2021-03-27 | Discharge: 2021-03-27 | Disposition: A | Discharge status: Home / Self Care | Payer: PPO | Attending: Urology | Admitting: Urology

## 2021-03-27 VITALS — BP 111/69 | HR 91 | Ht 67.5 in | Wt 200.4 lb

## 2021-03-27 DIAGNOSIS — N3281 Overactive bladder: Secondary | ICD-10-CM | POA: Diagnosis not present

## 2021-03-27 DIAGNOSIS — R361 Hematospermia: Secondary | ICD-10-CM | POA: Diagnosis not present

## 2021-03-27 DIAGNOSIS — N200 Calculus of kidney: Secondary | ICD-10-CM | POA: Diagnosis not present

## 2021-03-27 NOTE — Patient Instructions (Signed)
Dietary Guidelines to Help Prevent Kidney Stones Kidney stones are deposits of minerals and salts that form inside your kidneys. Your risk of developing kidney stones may be greater depending on your diet, your lifestyle, the medicines you take, and whether you have certain medical conditions. Most people can lower their chances of developing kidney stones by following the instructions below. Your dietitian may give you more specific instructions depending on your overall health and the type of kidney stones you tend to develop. What are tips for following this plan? Reading food labels  Choose foods with "no salt added" or "low-salt" labels. Limit your salt (sodium) intake to less than 1,500 mg a day. Choose foods with calcium for each meal and snack. Try to eat about 300 mg of calcium at each meal. Foods that contain 200-500 mg of calcium a serving include: 8 oz (237 mL) of milk, calcium-fortifiednon-dairy milk, and calcium-fortifiedfruit juice. Calcium-fortified means that calcium has been added to these drinks. 8 oz (237 mL) of kefir, yogurt, and soy yogurt. 4 oz (114 g) of tofu. 1 oz (28 g) of cheese. 1 cup (150 g) of dried figs. 1 cup (91 g) of cooked broccoli. One 3 oz (85 g) can of sardines or mackerel. Most people need 1,000-1,500 mg of calcium a day. Talk to your dietitian about how much calcium is recommended for you. Shopping Buy plenty of fresh fruits and vegetables. Most people do not need to avoid fruits and vegetables, even if these foods contain nutrients that may contribute to kidney stones. When shopping for convenience foods, choose: Whole pieces of fruit. Pre-made salads with dressing on the side. Low-fat fruit and yogurt smoothies. Avoid buying frozen meals or prepared deli foods. These can be high in sodium. Look for foods with live cultures, such as yogurt and kefir. Choose high-fiber grains, such as whole-wheat breads, oat bran, and wheat cereals. Cooking Do not add  salt to food when cooking. Place a salt shaker on the table and allow each person to add his or her own salt to taste. Use vegetable protein, such as beans, textured vegetable protein (TVP), or tofu, instead of meat in pasta, casseroles, and soups. Meal planning Eat less salt, if told by your dietitian. To do this: Avoid eating processed or pre-made food. Avoid eating fast food. Eat less animal protein, including cheese, meat, poultry, or fish, if told by your dietitian. To do this: Limit the number of times you have meat, poultry, fish, or cheese each week. Eat a diet free of meat at least 2 days a week. Eat only one serving each day of meat, poultry, fish, or seafood. When you prepare animal protein, cut pieces into small portion sizes. For most meat and fish, one serving is about the size of the palm of your hand. Eat at least five servings of fresh fruits and vegetables each day. To do this: Keep fruits and vegetables on hand for snacks. Eat one piece of fruit or a handful of berries with breakfast. Have a salad and fruit at lunch. Have two kinds of vegetables at dinner. Limit foods that are high in a substance called oxalate. These include: Spinach (cooked), rhubarb, beets, sweet potatoes, and Swiss chard. Peanuts. Potato chips, french fries, and baked potatoes with skin on. Nuts and nut products. Chocolate. If you regularly take a diuretic medicine, make sure to eat at least 1 or 2 servings of fruits or vegetables that are high in potassium each day. These include: Avocado. Banana. Orange, prune,   carrot, or tomato juice. Baked potato. Cabbage. Beans and split peas. Lifestyle  Drink enough fluid to keep your urine pale yellow. This is the most important thing you can do. Spread your fluid intake throughout the day. If you drink alcohol: Limit how much you use to: 0-1 drink a day for women who are not pregnant. 0-2 drinks a day for men. Be aware of how much alcohol is in your  drink. In the [place] one drink equals one 12 oz bottle of beer (355 mL), one 5 oz glass of wine (148 mL), or one 1 oz glass of hard liquor (44 mL). Lose weight if told by your health care provider. Work with your dietitian to find an eating plan and weight loss strategies that work best for you. General information Talk to your health care provider and dietitian about taking daily supplements. You may be told the following depending on your health and the cause of your kidney stones: Not to take supplements with vitamin C. To take a calcium supplement. To take a daily probiotic supplement. To take other supplements such as magnesium, fish oil, or vitamin B6. Take over-the-counter and prescription medicines only as told by your health care provider. These include supplements. What foods should I limit? Limit your intake of the following foods, or eat them as told by your dietitian. Vegetables Spinach. Rhubarb. Beets. Canned vegetables. Pickles. Olives. Baked potatoes with skin. Grains Wheat bran. Baked goods. Salted crackers. Cereals high in sugar. Meats and other proteins Nuts. Nut butters. Large portions of meat, poultry, or fish. Salted, precooked, or cured meats, such as sausages, meat loaves, and hot dogs. Dairy Cheese. Beverages Regular soft drinks. Regular vegetable juice. Seasonings and condiments Seasoning blends with salt. Salad dressings. Soy sauce. Ketchup. Barbecue sauce. Other foods Canned soups. Canned pasta sauce. Casseroles. Pizza. Lasagna. Frozen meals. Potato chips. French fries. The items listed above may not be a complete list of foods and beverages you should limit. Contact a dietitian for more information. What foods should I avoid? Talk to your dietitian about specific foods you should avoid based on the type of kidney stones you have and your overall health. Fruits Grapefruit. The item listed above may not be a complete list of foods and beverages you should  avoid. Contact a dietitian for more information. Summary Kidney stones are deposits of minerals and salts that form inside your kidneys. You can lower your risk of kidney stones by making changes to your diet. The most important thing you can do is drink enough fluid. Drink enough fluid to keep your urine pale yellow. Talk to your dietitian about how much calcium you should have each day, and eat less salt and animal protein as told by your dietitian. This information is not intended to replace advice given to you by your health care provider. Make sure you discuss any questions you have with your health care provider. Document Revised: 12/15/2018 Document Reviewed: 12/15/2018 Elsevier Patient Education  2022 [employer]  

## 2021-03-27 NOTE — Progress Notes (Signed)
? ?  03/27/2021 ?2:56 PM  ? ?Jerome Rodriguez ?17-Sep-1955 ?[id number] ? ?Reason for visit: Follow up nephrolithiasis, hematospermia, urinary symptoms ? ?HPI: ?66 year old male with history of large left renal stone treated with ureteroscopy in July 2021.  He has had no stone episodes since that time.  In November 2021 he had some new hematospermia, urinary urgency and pelvic pain, and this was ultimately felt to be secondary to a prostatitis as he improved with a course of Bactrim and NSAIDs.  He also had a cystoscopy at that time which was normal.  He denies any significant urinary symptoms at this time, and PVRs have been normal.  He denies any further problems with hematospermia. ? ?I personally viewed and interpreted his KUB today that shows possible punctate stones bilaterally, but no evidence of stones over the ureters. ? ?We discussed general stone prevention strategies including adequate hydration with goal of producing 2.5 L of urine daily, increasing citric acid intake, increasing calcium intake during high oxalate meals, minimizing animal protein, and decreasing salt intake. Information about dietary recommendations given today.  ? ?Follow-up with urology as needed ? ?Billey Co, MD ? ?[facility] ?[address], [address] ?[place], [place] [address] ?[phone] ? ? ?

## 2021-03-28 ENCOUNTER — Other Ambulatory Visit (HOSPITAL_COMMUNITY): Payer: Self-pay

## 2021-04-01 ENCOUNTER — Telehealth: Payer: Self-pay | Admitting: *Deleted

## 2021-04-01 NOTE — Telephone Encounter (Signed)
Patient called triage line requesting results from [facility] like clarification on results.  ?

## 2021-04-02 ENCOUNTER — Other Ambulatory Visit: Payer: Self-pay

## 2021-04-02 ENCOUNTER — Encounter: Payer: Self-pay | Admitting: Family Medicine

## 2021-04-02 ENCOUNTER — Ambulatory Visit (INDEPENDENT_AMBULATORY_CARE_PROVIDER_SITE_OTHER): Payer: PPO | Admitting: Family Medicine

## 2021-04-02 VITALS — BP 92/60 | HR 62 | Temp 97.2°F | Ht 68.0 in | Wt 201.4 lb

## 2021-04-02 DIAGNOSIS — E669 Obesity, unspecified: Secondary | ICD-10-CM

## 2021-04-02 DIAGNOSIS — R5383 Other fatigue: Secondary | ICD-10-CM | POA: Diagnosis not present

## 2021-04-02 DIAGNOSIS — I2510 Atherosclerotic heart disease of native coronary artery without angina pectoris: Secondary | ICD-10-CM | POA: Diagnosis not present

## 2021-04-02 DIAGNOSIS — H8112 Benign paroxysmal vertigo, left ear: Secondary | ICD-10-CM | POA: Diagnosis not present

## 2021-04-02 DIAGNOSIS — E1169 Type 2 diabetes mellitus with other specified complication: Secondary | ICD-10-CM | POA: Diagnosis not present

## 2021-04-02 DIAGNOSIS — E118 Type 2 diabetes mellitus with unspecified complications: Secondary | ICD-10-CM

## 2021-04-02 DIAGNOSIS — E785 Hyperlipidemia, unspecified: Secondary | ICD-10-CM | POA: Diagnosis not present

## 2021-04-02 DIAGNOSIS — E1159 Type 2 diabetes mellitus with other circulatory complications: Secondary | ICD-10-CM

## 2021-04-02 DIAGNOSIS — I152 Hypertension secondary to endocrine disorders: Secondary | ICD-10-CM | POA: Diagnosis not present

## 2021-04-02 LAB — POCT GLYCOSYLATED HEMOGLOBIN (HGB A1C): Hemoglobin A1C: 8.4 % — AB (ref 4.0–5.6)

## 2021-04-02 NOTE — Progress Notes (Signed)
?Subjective:  ? ? Patient ID: Jerome Rodriguez, male    DOB: [birth year], 66 y.o.   MRN: [id number] ? ?ARREN Rodriguez is a 66 y.o. male who presents for follow-up of Type 2 diabetes mellitus. ? ?Home blood sugar records:  pt not checking ?Current symptoms/problems include none and have been unchanged. ?Daily foot checks:yes   Any foot concerns: none  ?Exercise:  walking one hour a day ?Diet: good ?He is now taking Jardiance as well as metformin.  He has been on Jardiance roughly a month.  He states that during the same timeframe he has felt quite fatigued with occasional shortness of breath and has occasionally noted some dizziness.  The dizziness usually occurs when he looks to the left and down.  He continues on Plavix, atorvastatin, Vasotec and metoprolol.  He is having no difficulty with that. ?The following portions of the patient's history were reviewed and updated as appropriate: allergies, current medications, past medical history, past social history and problem list. ? ?ROS as in subjective above. ? ?   ?Objective:  ?  ?Physical Exam ?Alert and in no distress otherwise not examined. ?Hemoglobin A1c is 8.4 ? ? ?Lab Review ? ?  Latest Ref Rng & Units 04/02/2021  ? 12:27 PM 10/04/2020  ? 11:48 AM 06/04/2020  ? 10:38 AM 06/04/2020  ?  9:40 AM 02/05/2020  ? 10:45 AM  ?Diabetic Labs  ?HbA1c 4.0 - 5.6 % 8.4   7.5   7.6    7.1    ?Chol 100 - 199 mg/dL    158     ?HDL >39 mg/dL    43     ?Calc LDL 0 - 99 mg/dL    95     ?Triglycerides 0 - 149 mg/dL    108     ?Creatinine 0.76 - 1.27 mg/dL    1.07     ? ? ?  04/02/2021  ? 11:01 AM 03/27/2021  ?  2:59 PM 03/21/2021  ?  3:03 PM 02/13/2021  ?  4:00 PM 10/04/2020  ? 10:35 AM  ?BP/Weight  ?Systolic BP 92 263 335 456 100  ?Diastolic BP 60 69 60 70 66  ?Wt. (Lbs) 201.4 200.4 200  197.8  ?BMI 30.62 kg/m2 30.92 kg/m2 30.86 kg/m2  30.52 kg/m2  ? ? ?  06/04/2020  ?  8:30 AM 05/30/2019  ? 10:15 AM  ?Foot/eye exam completion dates  ?Foot Form Completion Done Done  ? ?EKG read by me  does show a slight tachycardia with occasional PVC otherwise normal ?Zared  reports that he has been smoking cigars. He has never used smokeless tobacco. He reports that he does not drink alcohol and does not use drugs. ? ?   ?Assessment & Plan:  ?  ?Type 2 diabetes with complication (HCC) - Plan: CBC with Differential/Platelet, Comprehensive metabolic panel, Lipid panel, POCT glycosylated hemoglobin (Hb A1C), CANCELED: POCT UA - Microalbumin ? ?Benign paroxysmal positional vertigo of left ear ? ?Hypertension associated with diabetes ([facility]) - Plan: CBC with Differential/Platelet, Comprehensive metabolic panel ? ?Hyperlipidemia associated with type 2 diabetes mellitus ([facility]) - Plan: Lipid panel ? ?Obesity (BMI 30-39.9) ? ?ASHD (arteriosclerotic heart disease) ?I will do blood work and if normal, he is to stop the Jardiance to see if the fatigue goes away.  He might have a started again and see if the fatigue returns convincing that he is having a reaction from it.  If it does we might consider this an adverse effect  from the medication.  He is to let me know in about a week.  He was also given instructions on Epley maneuver to help with his BPPV.  He will keep me informed concerning this in terms of possible referral for therapy for this. ? ? ?  ?

## 2021-04-02 NOTE — Patient Instructions (Addendum)
How to Perform the Epley Maneuver ?The Epley maneuver is an exercise that relieves symptoms of vertigo. Vertigo is the feeling that you or your surroundings are moving when they are not. When you feel vertigo, you may feel like the room is spinning and may have trouble walking. The Epley maneuver is used for a type of vertigo caused by a calcium deposit in a part of the inner ear. The maneuver involves changing head positions to help the deposit move out of the area. ?You can do this maneuver at home whenever you have symptoms of vertigo. You can repeat it in 24 hours if your vertigo has not gone away. ?Even though the Epley maneuver may relieve your vertigo for a few weeks, it is possible that your symptoms will return. This maneuver relieves vertigo, but it does not relieve dizziness. ?What are the risks? ?If it is done correctly, the Epley maneuver is considered safe. Sometimes it can lead to dizziness or nausea that goes away after a short time. If you develop other symptoms--such as changes in vision, weakness, or numbness--stop doing the maneuver and call your health care provider. ?Supplies needed: ?A bed or table. ?A pillow. ?How to do the Epley maneuver ?  ?Sit on the edge of a bed or table with your back straight and your legs extended or hanging over the edge of the bed or table. ?Turn your head halfway toward the affected ear or side as told by your health care provider. ?Lie backward quickly with your head turned until you are lying flat on your back. Your head should dangle (head-hanging position). You may want to position a pillow under your shoulders. ?Hold this position for at least 30 seconds. If you feel dizzy or have symptoms of vertigo, continue to hold the position until the symptoms stop. ?Turn your head to the opposite direction until your unaffected ear is facing down. Your head should continue to dangle. ?Hold this position for at least 30 seconds. If you feel dizzy or have symptoms of  vertigo, continue to hold the position until the symptoms stop. ?Turn your whole body to the same side as your head so that you are positioned on your side. Your head will now be nearly facedown and no longer needs to dangle. Hold for at least 30 seconds. If you feel dizzy or have symptoms of vertigo, continue to hold the position until the symptoms stop. ?Sit back up. ?You can repeat the maneuver in 24 hours if your vertigo does not go away. ?Follow these instructions at home: ?For 24 hours after doing the Epley maneuver: ?Keep your head in an upright position. ?When lying down to sleep or rest, keep your head raised (elevated) with two or more pillows. ?Avoid excessive neck movements. ?Activity ?Do not drive or use machinery if you feel dizzy. ?After doing the Epley maneuver, return to your normal activities as told by your health care provider. Ask your health care provider what activities are safe for you. ?General instructions ?Drink enough fluid to keep your urine pale yellow. ?Do not drink alcohol. ?Take over-the-counter and prescription medicines only as told by your health care provider. ?Keep all follow-up visits. This is important. ?Preventing vertigo symptoms ?Ask your health care provider if there is anything you should do at home to prevent vertigo. He or she may recommend that you: ?Keep your head elevated with two or more pillows while you sleep. ?Do not sleep on the side of your affected ear. ?Get up slowly  from bed. ?Avoid sudden movements during the day. ?Avoid extreme head positions or movement, such as looking up or bending over. ?Contact a health care provider if: ?Your vertigo gets worse. ?You have other symptoms, including: ?Nausea. ?Vomiting. ?Headache. ?Get help right away if you: ?Have vision changes. ?Have a headache or neck pain that is severe or getting worse. ?Cannot stop vomiting. ?Have new numbness or weakness in any part of your body. ?These symptoms may represent a serious problem  that is an emergency. Do not wait to see if the symptoms will go away. Get medical help right away. Call your local emergency services (911 in the [place] Do not drive yourself to the hospital. ?Summary ?Vertigo is the feeling that you or your surroundings are moving when they are not. ?The Epley maneuver is an exercise that relieves symptoms of vertigo. ?If the Epley maneuver is done correctly, it is considered safe. ?This information is not intended to replace advice given to you by your health care provider. Make sure you discuss any questions you have with your health care provider. ? ?Start checking your sugars either before a meal or preferably 2 hours after meals ?Document Revised: 11/22/2019 Document Reviewed: 11/22/2019 ?Elsevier Patient Education ? [place]. ? ?

## 2021-04-02 NOTE — Telephone Encounter (Signed)
On his KUB there is a possible 2 or 3 mm nonobstructing left renal stone, this may just be some debris in the bowel though.  Regardless, likely not something to worry about, and even if it was a small kidney stone would pass easily through on its own.  The calcifications in the pelvis are vascular and benign and not related to kidney stones ? ?[name], MD ?04/02/2021 ? ?

## 2021-04-03 ENCOUNTER — Other Ambulatory Visit (HOSPITAL_COMMUNITY): Payer: Self-pay

## 2021-04-03 LAB — CBC WITH DIFFERENTIAL/PLATELET
Basophils Absolute: 0.1 10*3/uL (ref 0.0–0.2)
Basos: 1 %
EOS (ABSOLUTE): 0.1 10*3/uL (ref 0.0–0.4)
Eos: 1 %
Hematocrit: 50.1 % (ref 37.5–51.0)
Hemoglobin: 17.3 g/dL (ref 13.0–17.7)
Immature Grans (Abs): 0.1 10*3/uL (ref 0.0–0.1)
Immature Granulocytes: 1 %
Lymphocytes Absolute: 1.8 10*3/uL (ref 0.7–3.1)
Lymphs: 16 %
MCH: 30 pg (ref 26.6–33.0)
MCHC: 34.5 g/dL (ref 31.5–35.7)
MCV: 87 fL (ref 79–97)
Monocytes Absolute: 0.7 10*3/uL (ref 0.1–0.9)
Monocytes: 6 %
Neutrophils Absolute: 8.4 10*3/uL — ABNORMAL HIGH (ref 1.4–7.0)
Neutrophils: 75 %
Platelets: 233 10*3/uL (ref 150–450)
RBC: 5.77 x10E6/uL (ref 4.14–5.80)
RDW: 12.9 % (ref 11.6–15.4)
WBC: 11.3 10*3/uL — ABNORMAL HIGH (ref 3.4–10.8)

## 2021-04-03 LAB — LIPID PANEL
Chol/HDL Ratio: 3.7 ratio (ref 0.0–5.0)
Cholesterol, Total: 181 mg/dL (ref 100–199)
HDL: 49 mg/dL (ref >39)
LDL Chol Calc (NIH): 101 mg/dL — ABNORMAL HIGH (ref 0–99)
Triglycerides: 181 mg/dL — ABNORMAL HIGH (ref 0–149)
VLDL Cholesterol Cal: 31 mg/dL (ref 5–40)

## 2021-04-03 LAB — COMPREHENSIVE METABOLIC PANEL
ALT: 13 IU/L (ref 0–44)
AST: 16 IU/L (ref 0–40)
Albumin/Globulin Ratio: 2 (ref 1.2–2.2)
Albumin: 4.4 g/dL (ref 3.8–4.8)
Alkaline Phosphatase: 144 IU/L — ABNORMAL HIGH (ref 44–121)
BUN/Creatinine Ratio: 13 (ref 10–24)
BUN: 14 mg/dL (ref 8–27)
Bilirubin Total: 0.6 mg/dL (ref 0.0–1.2)
CO2: 25 mmol/L (ref 20–29)
Calcium: 9.5 mg/dL (ref 8.6–10.2)
Chloride: 98 mmol/L (ref 96–106)
Creatinine, Ser: 1.08 mg/dL (ref 0.76–1.27)
Globulin, Total: 2.2 g/dL (ref 1.5–4.5)
Glucose: 211 mg/dL — ABNORMAL HIGH (ref 70–99)
Potassium: 5 mmol/L (ref 3.5–5.2)
Sodium: 137 mmol/L (ref 134–144)
Total Protein: 6.6 g/dL (ref 6.0–8.5)
eGFR: 76 mL/min/{1.73_m2} (ref >59)

## 2021-04-03 MED ORDER — ATORVASTATIN CALCIUM 40 MG PO TABS
40.0000 mg | ORAL_TABLET | Freq: Every day | ORAL | 3 refills | Status: DC
  Filled 2021-04-03: qty 90, 90d supply, fill #0
  Filled 2021-08-20: qty 90, 90d supply, fill #1
  Filled 2021-11-24: qty 90, 90d supply, fill #2

## 2021-04-03 NOTE — Addendum Note (Signed)
Addended by: Denita Lung on: 04/03/2021 10:03 AM ? ? Modules accepted: Orders ? ?

## 2021-04-04 ENCOUNTER — Encounter: Payer: Self-pay | Admitting: Family Medicine

## 2021-04-04 NOTE — Telephone Encounter (Signed)
Called pt informed him of the information below in great detail, pt voiced understanding.  ?

## 2021-04-16 ENCOUNTER — Telehealth: Payer: Self-pay | Admitting: Family Medicine

## 2021-04-16 NOTE — Telephone Encounter (Signed)
He stopped Jardiance since 3/30 , He wants to know what you want him to do next. ? ?Side effects are gone ? ?Please call or send Mychart message ?

## 2021-04-18 ENCOUNTER — Encounter: Payer: Self-pay | Admitting: Family Medicine

## 2021-04-18 ENCOUNTER — Telehealth (INDEPENDENT_AMBULATORY_CARE_PROVIDER_SITE_OTHER): Payer: PPO | Admitting: Family Medicine

## 2021-04-18 VITALS — BP 108/68 | Wt 196.0 lb

## 2021-04-18 DIAGNOSIS — E118 Type 2 diabetes mellitus with unspecified complications: Secondary | ICD-10-CM | POA: Diagnosis not present

## 2021-04-18 NOTE — Progress Notes (Signed)
? ?  Subjective:  ? ? Patient ID: Jerome Rodriguez, male    DOB: January 01, 1956, 66 y.o.   MRN: [id number] ? ?HPI ?Documentation for virtual audio and video telecommunications through [facility] encounter: ?The patient was located at home. 2 patient identifiers used.  ?The provider was located in the office. ?The patient did consent to this visit and is aware of possible charges through their insurance for this visit. ?The other persons participating in this telemedicine service were none. ?Time spent on call was 5 minutes and in review of previous records >20 minutes total for counseling and coordination of care. ?This virtual service is not related to other E/M service within previous 7 days.  ?He did not tolerate Jardiance.  It caused fatigue and polyuria and when he stopped, the symptoms went away.  He plans to his take Invokana which will apparently cost him $100 per month. Review of Systems ? ?   ?Objective:  ? Physical Exam ?Alert and in no distress otherwise not examined ? ? ? ?   ?Assessment & Plan:  ?Type 2 diabetes with complication ([facility]) ? ? I will try to work with our pharmacist to see if she can get a prior authorization due to drug intolerance so he can get the Invokana at a better rate. ? ?

## 2021-05-02 ENCOUNTER — Telehealth: Payer: Self-pay | Admitting: Pharmacist

## 2021-05-02 NOTE — Telephone Encounter (Signed)
Called [employer] to initiate a tier exception for the patient's intolerance to Jardiance. Representative provided me with the reference number of [phone]). It should take 72 hours to make a final decision. Will follow up next week on the outcome.  ? ? ?

## 2021-05-05 ENCOUNTER — Telehealth: Payer: Self-pay

## 2021-05-05 ENCOUNTER — Other Ambulatory Visit (HOSPITAL_COMMUNITY): Payer: Self-pay

## 2021-05-05 MED ORDER — CANAGLIFLOZIN 300 MG PO TABS
300.0000 mg | ORAL_TABLET | Freq: Every day | ORAL | 1 refills | Status: DC
  Filled 2021-05-05: qty 90, 90d supply, fill #0

## 2021-05-05 MED ORDER — CANAGLIFLOZIN 300 MG PO TABS
300.0000 mg | ORAL_TABLET | Freq: Every day | ORAL | 1 refills | Status: DC
  Filled 2021-05-05: qty 90, 90d supply, fill #0
  Filled 2021-09-03 – 2021-09-04 (×2): qty 90, 90d supply, fill #1

## 2021-05-05 NOTE — Telephone Encounter (Signed)
Tiering exception Invokana was approved til 01/04/22, called into [facility] pharmacy & went thru for $90 for 90 days.  Advised pt will give samples to help with cost when we have them.  Called pt and informed

## 2021-05-06 ENCOUNTER — Other Ambulatory Visit (HOSPITAL_COMMUNITY): Payer: Self-pay

## 2021-05-30 ENCOUNTER — Other Ambulatory Visit: Payer: Self-pay | Admitting: Family Medicine

## 2021-05-30 ENCOUNTER — Other Ambulatory Visit (HOSPITAL_COMMUNITY): Payer: Self-pay

## 2021-05-30 DIAGNOSIS — I2510 Atherosclerotic heart disease of native coronary artery without angina pectoris: Secondary | ICD-10-CM

## 2021-05-30 MED ORDER — CLOPIDOGREL BISULFATE 75 MG PO TABS
75.0000 mg | ORAL_TABLET | Freq: Every day | ORAL | 0 refills | Status: DC
  Filled 2021-05-30: qty 90, 90d supply, fill #0

## 2021-06-21 IMAGING — CR DG SHOULDER 2+V*L*
3 series · 3 of 3 positions shown · non-contrast
Comparison: None.

CLINICAL DATA: Left shoulder pain for 1 year, limited range of
motion

EXAM:
LEFT SHOULDER - 2+ VIEW

[x shoulder axillary left]
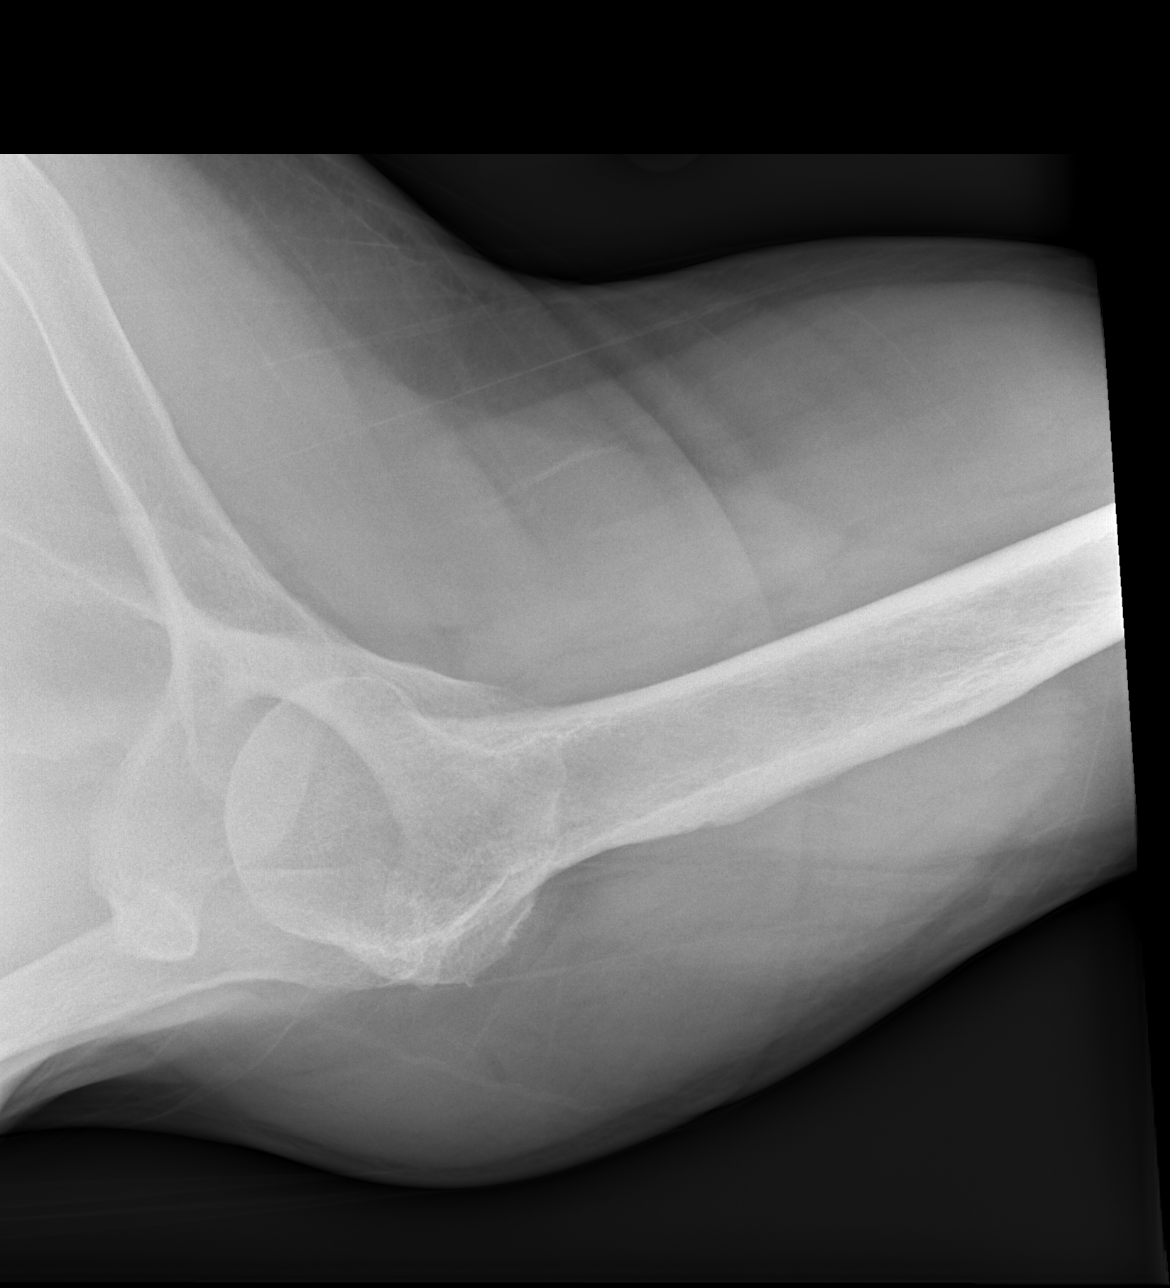

[w shoulder grashey left]
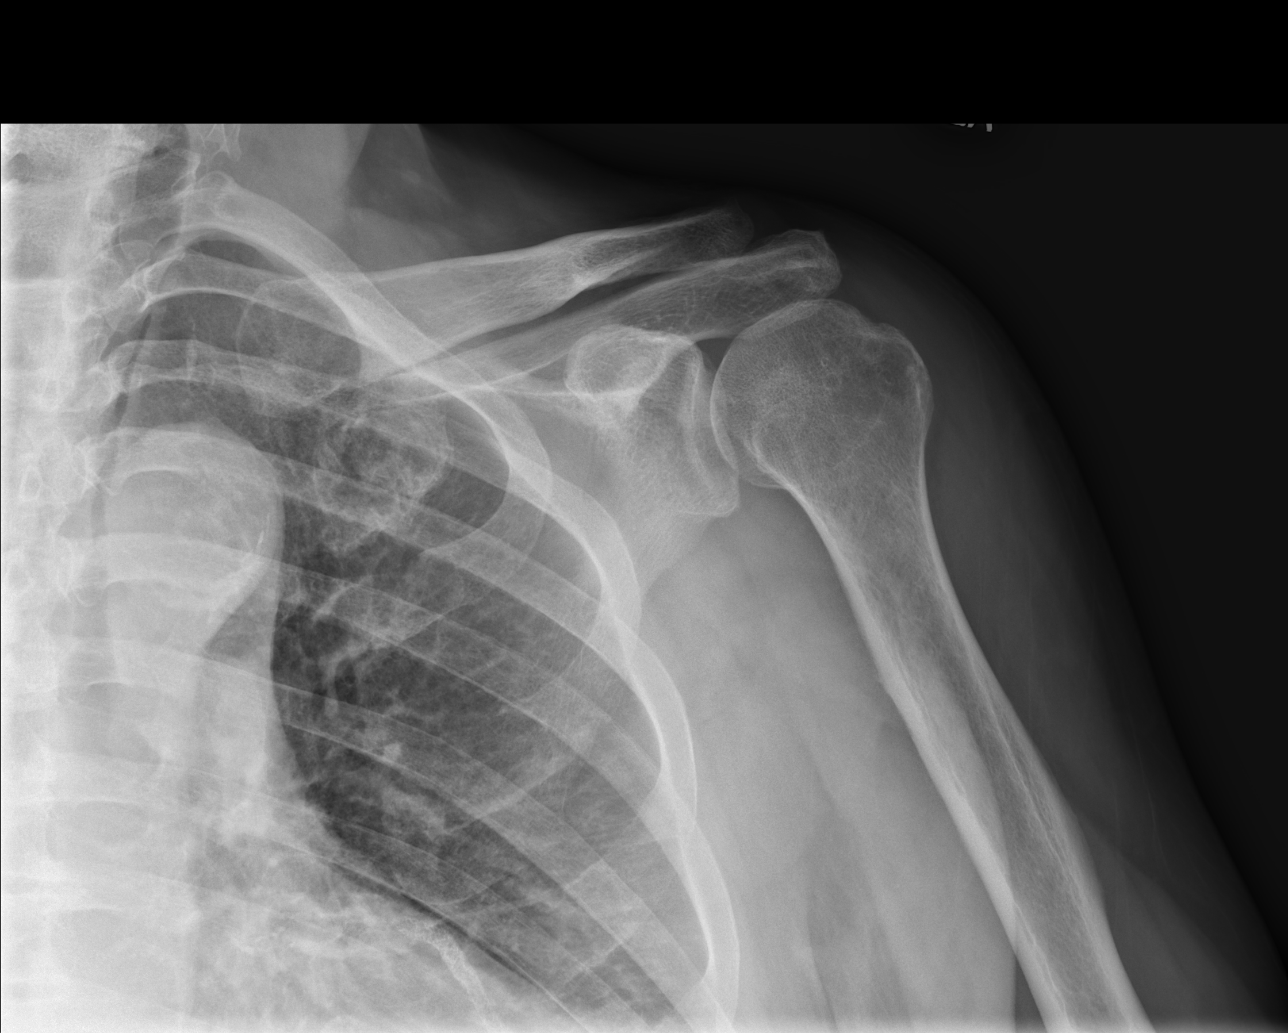

[w shoulder y-view left]
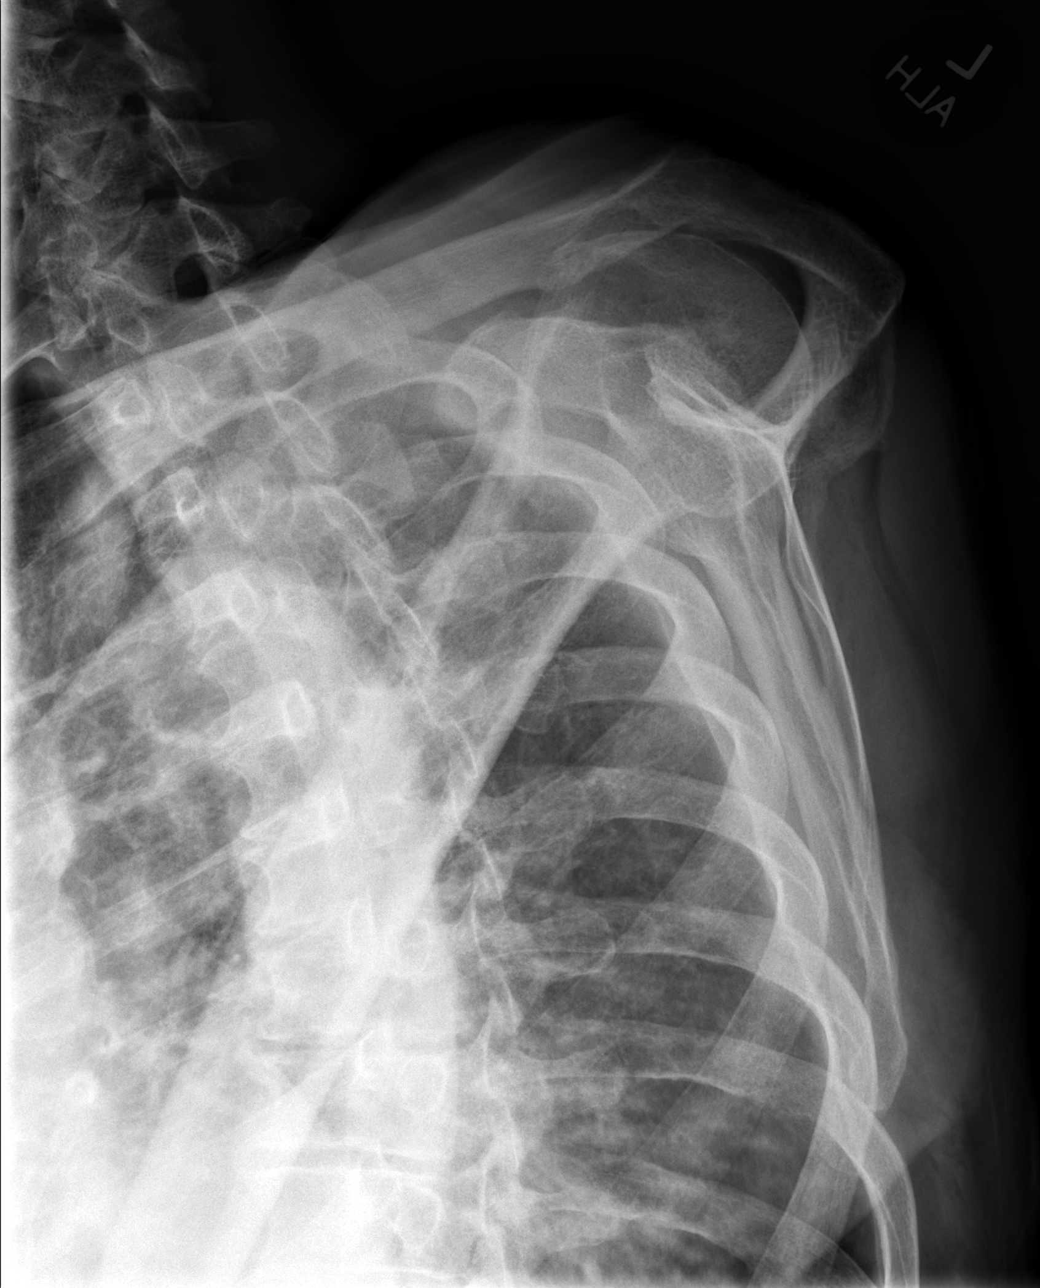

[3 of 3 positions shown; findings below may reference images not displayed]

FINDINGS: There is no evidence of fracture or dislocation. There is no
evidence of arthropathy or other focal bone abnormality. Soft
tissues are unremarkable. Aortic atherosclerosis.
IMPRESSION: Negative.

## 2021-06-24 ENCOUNTER — Other Ambulatory Visit: Payer: Self-pay | Admitting: Family Medicine

## 2021-06-24 ENCOUNTER — Other Ambulatory Visit (HOSPITAL_COMMUNITY): Payer: Self-pay

## 2021-06-24 DIAGNOSIS — E1159 Type 2 diabetes mellitus with other circulatory complications: Secondary | ICD-10-CM

## 2021-06-24 DIAGNOSIS — I2510 Atherosclerotic heart disease of native coronary artery without angina pectoris: Secondary | ICD-10-CM

## 2021-06-24 MED ORDER — ENALAPRIL MALEATE 2.5 MG PO TABS
2.5000 mg | ORAL_TABLET | Freq: Every day | ORAL | 0 refills | Status: DC
  Filled 2021-06-24: qty 90, 90d supply, fill #0

## 2021-06-24 MED ORDER — METOPROLOL SUCCINATE ER 25 MG PO TB24
25.0000 mg | ORAL_TABLET | Freq: Every day | ORAL | 0 refills | Status: DC
  Filled 2021-06-24: qty 90, 90d supply, fill #0

## 2021-07-24 ENCOUNTER — Ambulatory Visit (INDEPENDENT_AMBULATORY_CARE_PROVIDER_SITE_OTHER): Payer: PPO | Admitting: Family Medicine

## 2021-07-24 ENCOUNTER — Encounter: Payer: Self-pay | Admitting: Family Medicine

## 2021-07-24 VITALS — BP 92/68 | HR 76 | Temp 97.7°F | Wt 196.8 lb

## 2021-07-24 DIAGNOSIS — E669 Obesity, unspecified: Secondary | ICD-10-CM | POA: Diagnosis not present

## 2021-07-24 DIAGNOSIS — E785 Hyperlipidemia, unspecified: Secondary | ICD-10-CM | POA: Diagnosis not present

## 2021-07-24 DIAGNOSIS — E118 Type 2 diabetes mellitus with unspecified complications: Secondary | ICD-10-CM | POA: Diagnosis not present

## 2021-07-24 DIAGNOSIS — I152 Hypertension secondary to endocrine disorders: Secondary | ICD-10-CM | POA: Diagnosis not present

## 2021-07-24 DIAGNOSIS — I2510 Atherosclerotic heart disease of native coronary artery without angina pectoris: Secondary | ICD-10-CM

## 2021-07-24 DIAGNOSIS — E1159 Type 2 diabetes mellitus with other circulatory complications: Secondary | ICD-10-CM

## 2021-07-24 DIAGNOSIS — E1169 Type 2 diabetes mellitus with other specified complication: Secondary | ICD-10-CM | POA: Diagnosis not present

## 2021-07-24 LAB — POCT GLYCOSYLATED HEMOGLOBIN (HGB A1C): Hemoglobin A1C: 7.8 % — AB (ref 4.0–5.6)

## 2021-07-24 LAB — POCT UA - MICROALBUMIN
Albumin/Creatinine Ratio, Urine, POC: <5.7
Creatinine, POC: 87.7 mg/dL
Microalbumin Ur, POC: <5 mg/L

## 2021-07-24 NOTE — Progress Notes (Signed)
  Subjective:    Patient ID: Jerome Rodriguez, male    DOB: 25-Dec-1955, 66 y.o.   MRN: [id number]  Jerome Rodriguez is a 66 y.o. male who presents for follow-up of Type 2 diabetes mellitus.  Patient is checking home blood sugars.   Home blood sugar records: BGs have been labile ranging between 123 and 179 How often is blood sugars being checked: fasting  and post meal  Current symptoms/problems include none and have been stable. Daily foot checks: yes   Any foot concerns: none  Last eye exam: Q year Jerome Rodriguez Exercise:  walking QD He has made some diet and exercise changes since last being seen.  He does state that if his blood sugar drops down below 100 he does tend to feel it.  His medications were reviewed and unchanged. The following portions of the patient's history were reviewed and updated as appropriate: allergies, current medications, past medical history, past social history and problem list.  ROS as in subjective above.     Objective:    Physical Exam Alert and in no distress otherwise not examined. Hemoglobin A1c is 7.8. Blood pressure 92/68, pulse 76, temperature 97.7 F (36.5 C), weight 196 lb 12.8 oz (89.3 kg), SpO2 95 %.  Lab Review    Latest Ref Rng & Units 04/02/2021   12:27 PM 04/02/2021   12:21 PM 10/04/2020   11:48 AM 06/04/2020   10:38 AM 06/04/2020    9:40 AM  Diabetic Labs  HbA1c 4.0 - 5.6 % 8.4   7.5  7.6    Chol 100 - 199 mg/dL  181    158   HDL >39 mg/dL  49    43   Calc LDL 0 - 99 mg/dL  101    95   Triglycerides 0 - 149 mg/dL  181    108   Creatinine 0.76 - 1.27 mg/dL  1.08    1.07       07/24/2021   11:26 AM 04/18/2021    1:16 PM 04/02/2021   11:01 AM 03/27/2021    2:59 PM 03/21/2021    3:03 PM  BP/Weight  Systolic BP 92 353 92 299 242  Diastolic BP 68 68 60 69 60  Wt. (Lbs) 196.8 196 201.4 200.4 200  BMI 29.92 kg/m2 29.8 kg/m2 30.62 kg/m2 30.92 kg/m2 30.86 kg/m2      Latest Ref Rng & Units 07/24/2021   11:00 AM 06/20/2020   12:00 AM   Foot/eye exam completion dates  Eye Exam No Retinopathy  No Retinopathy      Foot Form Completion  Done      This result is from an external source.    Jerome Rodriguez  reports that he has been smoking cigars. He has never used smokeless tobacco. He reports that he does not drink alcohol and does not use drugs.     Assessment & Plan:    Type 2 diabetes with complication (HCC) - Plan: POCT glycosylated hemoglobin (Hb A1C), POCT UA - Microalbumin  Hypertension associated with diabetes ([facility])  Hyperlipidemia associated with type 2 diabetes mellitus ([facility])  Obesity (BMI 30-39.9)  ASHD (arteriosclerotic heart disease)  He is doing a little bit better job of taking care of himself and I complemented him on the work that he is done.  Encouraged him to continue with his present diet and exercise program as well as his medications.  Recheck here in several months.

## 2021-07-24 NOTE — Patient Instructions (Signed)

## 2021-07-25 ENCOUNTER — Other Ambulatory Visit: Payer: Self-pay

## 2021-07-25 ENCOUNTER — Telehealth: Payer: Self-pay | Admitting: Family Medicine

## 2021-07-25 DIAGNOSIS — E118 Type 2 diabetes mellitus with unspecified complications: Secondary | ICD-10-CM

## 2021-07-25 LAB — COMPREHENSIVE METABOLIC PANEL
ALT: 11 IU/L (ref 0–44)
AST: 12 IU/L (ref 0–40)
Albumin/Globulin Ratio: 2 (ref 1.2–2.2)
Albumin: 4.3 g/dL (ref 3.9–4.9)
Alkaline Phosphatase: 138 IU/L — ABNORMAL HIGH (ref 44–121)
BUN/Creatinine Ratio: 11 (ref 10–24)
BUN: 11 mg/dL (ref 8–27)
Bilirubin Total: 0.6 mg/dL (ref 0.0–1.2)
CO2: 23 mmol/L (ref 20–29)
Calcium: 8.8 mg/dL (ref 8.6–10.2)
Chloride: 100 mmol/L (ref 96–106)
Creatinine, Ser: 1.01 mg/dL (ref 0.76–1.27)
Globulin, Total: 2.1 g/dL (ref 1.5–4.5)
Glucose: 167 mg/dL — ABNORMAL HIGH (ref 70–99)
Potassium: 4.4 mmol/L (ref 3.5–5.2)
Sodium: 138 mmol/L (ref 134–144)
Total Protein: 6.4 g/dL (ref 6.0–8.5)
eGFR: 82 mL/min/{1.73_m2} (ref >59)

## 2021-07-25 LAB — LIPID PANEL
Chol/HDL Ratio: 3.1 ratio (ref 0.0–5.0)
Cholesterol, Total: 133 mg/dL (ref 100–199)
HDL: 43 mg/dL (ref >39)
LDL Chol Calc (NIH): 70 mg/dL (ref 0–99)
Triglycerides: 110 mg/dL (ref 0–149)
VLDL Cholesterol Cal: 20 mg/dL (ref 5–40)

## 2021-07-25 MED ORDER — METFORMIN HCL ER 750 MG PO TB24: 750.0000 mg | ORAL_TABLET | Freq: Two times a day (BID) | ORAL | 1 refills | Status: DC

## 2021-07-25 NOTE — Telephone Encounter (Signed)
[facility] sent refill request for metform please send to the [facility] [place] - [address]

## 2021-07-25 NOTE — Telephone Encounter (Signed)
Done KH 

## 2021-08-04 DIAGNOSIS — H40013 Open angle with borderline findings, low risk, bilateral: Secondary | ICD-10-CM | POA: Diagnosis not present

## 2021-08-04 DIAGNOSIS — E119 Type 2 diabetes mellitus without complications: Secondary | ICD-10-CM | POA: Diagnosis not present

## 2021-08-04 DIAGNOSIS — H2513 Age-related nuclear cataract, bilateral: Secondary | ICD-10-CM | POA: Diagnosis not present

## 2021-08-04 DIAGNOSIS — H40053 Ocular hypertension, bilateral: Secondary | ICD-10-CM | POA: Diagnosis not present

## 2021-08-04 LAB — HM DIABETES EYE EXAM

## 2021-08-20 ENCOUNTER — Other Ambulatory Visit (HOSPITAL_COMMUNITY): Payer: Self-pay

## 2021-08-20 ENCOUNTER — Other Ambulatory Visit: Payer: Self-pay | Admitting: Medical

## 2021-08-20 DIAGNOSIS — I2510 Atherosclerotic heart disease of native coronary artery without angina pectoris: Secondary | ICD-10-CM

## 2021-08-20 MED ORDER — CLOPIDOGREL BISULFATE 75 MG PO TABS
75.0000 mg | ORAL_TABLET | Freq: Every day | ORAL | 3 refills | Status: DC
  Filled 2021-08-20: qty 90, 90d supply, fill #0
  Filled 2021-11-24: qty 90, 90d supply, fill #1

## 2021-09-04 ENCOUNTER — Other Ambulatory Visit (HOSPITAL_COMMUNITY): Payer: Self-pay

## 2021-09-10 ENCOUNTER — Encounter: Payer: Self-pay | Admitting: Internal Medicine

## 2021-09-22 ENCOUNTER — Other Ambulatory Visit (HOSPITAL_COMMUNITY): Payer: Self-pay

## 2021-09-22 ENCOUNTER — Other Ambulatory Visit: Payer: Self-pay | Admitting: Family Medicine

## 2021-09-22 DIAGNOSIS — I152 Hypertension secondary to endocrine disorders: Secondary | ICD-10-CM

## 2021-09-22 DIAGNOSIS — I2510 Atherosclerotic heart disease of native coronary artery without angina pectoris: Secondary | ICD-10-CM

## 2021-09-22 MED ORDER — METOPROLOL SUCCINATE ER 25 MG PO TB24
25.0000 mg | ORAL_TABLET | Freq: Every day | ORAL | 0 refills | Status: DC
  Filled 2021-09-22: qty 90, 90d supply, fill #0

## 2021-09-22 MED ORDER — ENALAPRIL MALEATE 2.5 MG PO TABS
2.5000 mg | ORAL_TABLET | Freq: Every day | ORAL | 0 refills | Status: DC
  Filled 2021-09-22: qty 90, 90d supply, fill #0

## 2021-10-07 ENCOUNTER — Telehealth: Payer: Self-pay | Admitting: Pharmacist

## 2021-10-07 NOTE — Chronic Care Management (AMB) (Signed)
Chronic Care Management Pharmacy Assistant   Name: Jerome Rodriguez  MRN: [id number] DOB: 1955-02-22  Reason for Encounter: Disease State   Conditions to be addressed/monitored: General Assessment  Recent office visits:  07/24/21 Denita Lung, MD - Patient presented for Type 2 diabetes with complication and other concerns. No medication changes.  04/18/21 Denita Lung, MD - Patient presented via video for Type 2 diabetes with complication. Plan to change to Invokana.  Recent consult visits:  08/04/21 [place] [place] encounter for Age related nuclear bilateral cataract and other concerns. No other visit details available.  Hospital visits:  None in previous 6 months  Medications: Outpatient Encounter Medications as of 10/07/2021  Medication Sig   aspirin 81 MG EC tablet Take 1 tablet (81 mg total) by mouth daily.   atorvastatin (LIPITOR) 40 MG tablet Take 1 tablet (40 mg total) by mouth daily.   canagliflozin (INVOKANA) 300 MG TABS tablet Take 1 tablet (300 mg total) by mouth daily before breakfast.   clopidogrel (PLAVIX) 75 MG tablet Take 1 tablet (75 mg total) by mouth daily.   enalapril (VASOTEC) 2.5 MG tablet Take 1 tablet (2.5 mg total) by mouth daily.   glucose blood (TRUETEST TEST) test strip 1 each by Other route 2 (two) times daily. Use as instructed (This is for [place] test DX: E11.9)   Lancets Misc. MISC 1 each by Does not apply route 2 (two) times daily. (This is for ture test DX: E11.9)   loratadine (CLARITIN) 10 MG tablet Take 10 mg by mouth daily.   metFORMIN (GLUCOPHAGE-XR) 750 MG 24 hr tablet Take 1 tablet (750 mg total) by mouth in the morning and at bedtime.   metoprolol succinate (TOPROL-XL) 25 MG 24 hr tablet Take 1 tablet (25 mg total) by mouth daily.   Multiple Vitamin (MULTIVITAMIN) tablet Take 1 tablet by mouth daily. (Patient not taking: Reported on 02/13/2021)   nitroGLYCERIN (NITROSTAT) 0.4 MG SL tablet Place 1 tablet (0.4 mg total)  under the tongue every 5 (five) minutes x 3 doses as needed for chest pain. (Patient not taking: Reported on 07/24/2021)   No facility-administered encounter medications on file as of 10/07/2021.  [place] [facility] for General Review Call  Adherence Review:  Does the Clinical Pharmacist Assistant have access to adherence rates? Yes Adherence rates for STAR metric medications   Atorvastatin 40 mg - Last filled 08/21/21 90 DS at [facility]  Atorvastatin 40 mg - Last filled 04/08/21 90 DS at [facility] 300 mg - Last filled 09/05/21 90 DS at [facility] 300 mg - Last filled 05/13/21 90 DS at [facility] Metformin 750 mg - Last filled 07/25/21 90 DS at [facility] Metformin 750 mg - Last filled 05/01/21 90 DS at [facility]  Does the patient have >5 day gap between last estimated fill dates for any of the above medications or other medication gaps? Yes    Disease State Questions:  Able to connect with Patient? Yes Did patient have any problems with their health recently? Yes Patient reports his urology issue of increased urgency and leakage has returned. He reports his next appointment with them is more than a month out and that he had been advised to cut back the aleve that was recommended by Urology from 2 a day to 1 every other day by the Pharmacist due to a possible interaction with his other medications. Advised him he should see if he could move  up his urology appointment and that I would see what other recommendations the Pharmacist has. Have you had any admissions or emergency room visits or worsening of your condition(s) since last visit? No  Have you had any visits with new specialists or providers since your last visit? No  Have you had any new health care problem(s) since your last visit? No  Have you run out of any of your medications since you last spoke with clinical pharmacist? No  Are there any medications you are not taking as prescribed? No  Are you having  any issues or side effects with your medications? No  Do you have any other health concerns or questions you want to discuss with your Clinical Pharmacist before your next visit? No  Are there any health concerns that you feel we can do a better job addressing? No Note Patient's response. Are you having any problems with any of the following since the last visit: (select all that apply)  Toileting  Details: Increased urgency and leakage has returned 12. Any falls since last visit? No   13. Any increased or uncontrolled pain since last visit? No  . Additional Details? Per Jeni Salles call to patient to advise she recommends to use the minimum possible dose as he is taking Asprin and Plavix and there is an increased risk for bleeding. Patient reports understanding and will try and move his urology appointment sooner.   Care Gaps: COVID Booster - Overdue Flu Vaccine - Overdue CCM- Decl BP- 92/68 07/24/21 AWV- 3/23 Lab Results  Component Value Date   HGBA1C 7.8 (A) 07/24/2021    Star Rating Drugs: Atorvastatin 40 mg - Last filled 08/21/21 90 DS at [facility]  Invokana 300 mg - Last filled 09/05/21 90 DS at [facility] Metformin 750 mg - Last filled 07/25/21 90 DS at [facility] Pharmacist Assistant [phone]

## 2021-10-07 NOTE — Chronic Care Management (AMB) (Signed)
A user error has taken place: encounter opened in error, closed for administrative reasons.

## 2021-10-14 ENCOUNTER — Encounter: Payer: Self-pay | Admitting: Internal Medicine

## 2021-10-27 ENCOUNTER — Other Ambulatory Visit (INDEPENDENT_AMBULATORY_CARE_PROVIDER_SITE_OTHER): Payer: PPO

## 2021-10-27 ENCOUNTER — Encounter: Payer: Self-pay | Admitting: Internal Medicine

## 2021-10-27 DIAGNOSIS — Z23 Encounter for immunization: Secondary | ICD-10-CM | POA: Diagnosis not present

## 2021-11-02 NOTE — Telephone Encounter (Signed)
Pt was denied due to income

## 2021-11-06 ENCOUNTER — Telehealth: Payer: Self-pay | Admitting: Family Medicine

## 2021-11-06 NOTE — Telephone Encounter (Signed)
Patient called and states you told him to take Aleve for his bladder leakage, prostatitis. He states he was seen by his PCP and he can not take Aleve because he is on Plavix and Aspirin. Patient wants to know if there is something else you can prescribe? He states he leaks every time he uses the bathroom and needs something other than Aleve.

## 2021-11-06 NOTE — Telephone Encounter (Signed)
Recommend appt for UA and PVR per sninsky   Notified patient as instructed,  Discussed follow-up appointments, patient agrees

## 2021-11-12 ENCOUNTER — Ambulatory Visit: Payer: PPO | Admitting: Urology

## 2021-11-12 ENCOUNTER — Other Ambulatory Visit (HOSPITAL_COMMUNITY): Payer: Self-pay

## 2021-11-12 ENCOUNTER — Encounter: Payer: Self-pay | Admitting: Urology

## 2021-11-12 VITALS — BP 98/65 | HR 85 | Ht 68.0 in | Wt 196.0 lb

## 2021-11-12 DIAGNOSIS — R350 Frequency of micturition: Secondary | ICD-10-CM

## 2021-11-12 DIAGNOSIS — N3281 Overactive bladder: Secondary | ICD-10-CM | POA: Diagnosis not present

## 2021-11-12 DIAGNOSIS — N3943 Post-void dribbling: Secondary | ICD-10-CM | POA: Diagnosis not present

## 2021-11-12 DIAGNOSIS — N3941 Urge incontinence: Secondary | ICD-10-CM

## 2021-11-12 DIAGNOSIS — Z125 Encounter for screening for malignant neoplasm of prostate: Secondary | ICD-10-CM

## 2021-11-12 LAB — URINALYSIS, COMPLETE
Bilirubin, UA: NEGATIVE
Ketones, UA: NEGATIVE
Leukocytes,UA: NEGATIVE
Nitrite, UA: NEGATIVE
Protein,UA: NEGATIVE
RBC, UA: NEGATIVE
Specific Gravity, UA: <1.005 — ABNORMAL LOW (ref 1.005–1.030)
Urobilinogen, Ur: 0.2 mg/dL (ref 0.2–1.0)
pH, UA: 5 (ref 5.0–7.5)

## 2021-11-12 LAB — MICROSCOPIC EXAMINATION: Bacteria, UA: NONE SEEN

## 2021-11-12 LAB — BLADDER SCAN AMB NON-IMAGING

## 2021-11-12 MED ORDER — OXYBUTYNIN CHLORIDE ER 10 MG PO TB24
10.0000 mg | ORAL_TABLET | Freq: Every day | ORAL | 11 refills | Status: DC
  Filled 2021-11-12: qty 30, 30d supply, fill #0

## 2021-11-12 NOTE — Progress Notes (Signed)
   11/12/2021 11:50 AM   Jerome Rodriguez [birth year] [id number]  Reason for visit: Urinary symptoms, question about PSA screening, history of nephrolithiasis  HPI: 66 year old male who I have seen previously for the above issues.  He had a possible episode of prostatitis in November 2021 with hematospermia, urinary urgency, and pelvic pain, and he improved with a course of Bactrim and NSAIDs.  He also had a cystoscopy at that time that was normal.  He has a history of nephrolithiasis that required ureteroscopy in 2021.  He reports new postvoid dribbling and urinary urgency with occasional urge incontinence.  This has been going on about 6 weeks.  He has poorly controlled diabetes.  Urinalysis today is completely benign aside from 3+ glucose, and PVR is normal at 9 mL.  His symptoms seem to be more consistent with overactive bladder.  He denies any pelvic pain, gross hematuria, or dysuria.  He has nocturia 1-2 times overnight.  We discussed the overlap between chronic prostatitis, BPH, and OAB.  I recommended a trial of an OAB medication oxybutynin to see if we can improve some of his urgency.  I also encouraged him to work on his diabetes control, as his urinary frequency is likely exacerbated by his glucosuria.  He also had questions about PSA screening.  We reviewed that his last PSA was well within the normal range at 0.2 in November 2021, and that the AUA now is recommending screening every 2 to 4 years through age 78.  We also reviewed that his symptoms of some postvoid dribbling and urgency would not be common symptoms of prostate cancer.  Trial of oxybutynin 10 mg XL daily, behavioral strategies discussed RTC 4 weeks symptom check and PVR-if no improvement at that time could consider trial of Flomax instead or [place], MD  [facility] [address] [place], [place] [address] [phone]

## 2021-11-19 DIAGNOSIS — M75122 Complete rotator cuff tear or rupture of left shoulder, not specified as traumatic: Secondary | ICD-10-CM | POA: Diagnosis not present

## 2021-11-19 DIAGNOSIS — M25511 Pain in right shoulder: Secondary | ICD-10-CM | POA: Diagnosis not present

## 2021-11-19 DIAGNOSIS — M25512 Pain in left shoulder: Secondary | ICD-10-CM | POA: Diagnosis not present

## 2021-11-20 NOTE — Progress Notes (Signed)
Cardiology Office Note:    Date:  11/21/2021   ID:  SHALIK SANFILIPPO, DOB [birth year], MRN [id number]  PCP:  Denita Lung, MD  [facility] Cardiologist:  Sherren Mocha, MD Cardiology APP:  [facility]    Referring MD: Denita Lung, MD   Chief Complaint:  F/u for CAD    Patient Profile: Coronary artery disease  NSTEMI s/p Taxus DES to LAD in 2004 Myoview in 2013: low risk  S/p 3 x 38 mm Promus DES to LAD 2/2 ISR in 2014 Cath 07/11/12 w residual CAD: LM < 10, mid LCx 20, distal LCx 30-40, proximal RCA 50-60, 30 at crux Long term DAPT Diabetes mellitus  Hypertension  Hyperlipidemia  Tobacco abuse [place] in 6/21: no AAA       History of Present Illness:   EMRY BARBATO is a 66 y.o. male with the above problem list.  He was last seen 08/30/2020.  He returns for follow-up. He is here alone. He is doing well w/o chest pain, shortness of breath, syncope, orthopnea, leg edema. He still goes for a 1 hour walk each day. He is semi-retired. 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          Past Medical History:  Diagnosis Date   Abnormal chest CT    a. 07/2012: scattered bilat noncalcified pulm nodules ranging in size from a few mm to a max of 33m in LLL - rec f/u in 3-6 mos.   CAD (coronary artery disease)    a. NSTEMI 10/04 => LHC: mLAD 99%=> Taxus DES to mLAD and Taxus DES to dLAD;  b. ETT-Myoview 11/2011: normal, EF 73%, no ischemia; c. 07/2012 Cath/PCI: LM <10, LAD 90 ISRp/99 ISRd (3.0x38 Promus DES), LCX 266m30-40d, RCA 50-60p, 30d, EF 55-65%.   Diabetes mellitus    Dyslipidemia    ED (erectile dysfunction)    History of kidney stones    HTN (hypertension)    Kidney stone    Myocardial infarction (HCC)    Obesity    Smoker    CIGARS   Current Medications: Current Meds  Medication Sig   aspirin 81 MG EC tablet Take 1 tablet (81 mg total) by mouth daily.    atorvastatin (LIPITOR) 40 MG tablet Take 1 tablet (40 mg total) by mouth daily.   canagliflozin (INVOKANA) 300 MG TABS tablet Take 1 tablet (300 mg total) by mouth daily before breakfast.   clopidogrel (PLAVIX) 75 MG tablet Take 1 tablet (75 mg total) by mouth daily.   enalapril (VASOTEC) 2.5 MG tablet Take 1 tablet (2.5 mg total) by mouth daily.   glucose blood (TRUETEST TEST) test strip 1 each by Other route 2 (two) times daily. Use as instructed (This is for Ture test DX: E11.9)   Lancets Misc. MISC 1 each by Does not apply route 2 (two) times daily. (This is for ture test DX: E11.9)   loratadine (CLARITIN) 10 MG tablet Take 10 mg by mouth daily.   metFORMIN (GLUCOPHAGE-XR) 750 MG 24 hr tablet Take 1 tablet (750 mg total) by mouth in the morning and at bedtime.   metoprolol succinate (TOPROL-XL) 25 MG 24 hr tablet Take 1 tablet (25 mg total) by mouth daily.   Multiple Vitamin (MULTIVITAMIN) tablet Take 1 tablet by mouth daily.   nitroGLYCERIN (NITROSTAT) 0.4 MG SL tablet Place 1 tablet (0.4 mg total) under the  tongue every 5 (five) minutes x 3 doses as needed for chest pain.   oxybutynin (DITROPAN-XL) 10 MG 24 hr tablet Take 1 tablet (10 mg total) by mouth daily.    Allergies:   Lactose intolerance (gi) and Prasugrel   Social History   Occupational History   Occupation: taxi driver  Tobacco Use   Smoking status: Light Smoker    Types: Cigars    Passive exposure: Current   Smokeless tobacco: Never   Tobacco comments:    smoke 1--d for 25 years; quit 6 months ago. is now smoking ciagrs occasionally   Vaping Use   Vaping Use: Never used  Substance and Sexual Activity   Alcohol use: No    Comment: rare    Drug use: No   Sexual activity: Yes    Birth control/protection: None    Family Hx: The patient's family history includes Diabetic kidney disease in his father and mother; Heart attack in his father and mother; Heart failure in his father.  Review of Systems  Cardiovascular:   Negative for claudication.  Hematologic/Lymphatic: Does not bruise/bleed easily.     EKGs/Labs/Other Test Reviewed:    EKG:  EKG is not ordered today.     Recent Labs: 04/02/2021: Hemoglobin 17.3; Platelets 233 07/24/2021: ALT 11; BUN 11; Creatinine, Ser 1.01; Potassium 4.4; Sodium 138   Recent Lipid Panel Recent Labs    07/24/21 1203  CHOL 133  TRIG 110  HDL 43  LDLCALC 70      Risk Assessment/Calculations/Metrics:              Physical Exam:    VS:  BP 122/60   Pulse 74   Ht '5\' 7"'$  (1.702 m)   Wt 191 lb 3.2 oz (86.7 kg)   SpO2 97%   BMI 29.95 kg/m     Wt Readings from Last 3 Encounters:  11/21/21 191 lb 3.2 oz (86.7 kg)  11/12/21 196 lb (88.9 kg)  07/24/21 196 lb 12.8 oz (89.3 kg)    Constitutional:      Appearance: Healthy appearance. Not in distress.  Neck:     Vascular: JVD normal.  Pulmonary:     Effort: Pulmonary effort is normal.     Breath sounds: No wheezing. No rales.  Cardiovascular:     Normal rate. Regular rhythm. Normal S1. Normal S2.      Murmurs: There is no murmur.  Edema:    Peripheral edema absent.  Abdominal:     Palpations: Abdomen is soft.  Skin:    General: Skin is warm and dry.  Neurological:     General: No focal deficit present.     Mental Status: Alert and oriented to person, place and time.          ASSESSMENT & PLAN:   Coronary artery disease involving native coronary artery of native heart without angina pectoris History of non-STEMI treated with a Taxus DES to the in 2004 and subsequent DES to the LAD secondary to in-stent restenosis in 2014.  He is doing well without angina.  He has been maintained on dual antiplatelet therapy for many years.  He continues to tolerate this.  Continue aspirin 81 mg daily, Lipitor 40 mg daily, Plavix 75 mg daily, Toprol-XL 25 mg daily, as needed nitroglycerin.  Follow-up 1 year.  Hypertension associated with diabetes ([place]) Blood pressure is controlled.  Continue enalapril 2.5 mg  daily, Toprol-XL 25 mg daily.  Hyperlipidemia associated with type 2 diabetes mellitus (HCC) Goal LDL <  70 (ideally < 55). LDL in July was 70.  His primary care provider increased his atorvastatin to 40 mg daily.  He has follow-up labs pending next year.  Continue Lipitor 40 mg daily.  Tobacco abuse We discussed strategies for quitting. He plans to get the nicotine patch. We also briefly discussed Chantix. He can try this if the nicotine patches are not successful.             Dispo:  Return in about 1 year (around 11/22/2022) for Routine Follow Up, w/ Dr. Burt Knack, or Richardson Dopp, PA-C.   Medication Adjustments/Labs and Tests Ordered: Current medicines are reviewed at length with the patient today.  Concerns regarding medicines are outlined above.  Tests Ordered: No orders of the defined types were placed in this encounter.  Medication Changes: No orders of the defined types were placed in this encounter.  Signed, Richardson Dopp, PA-C  11/21/2021 10:38 AM    [facility] [place], [place], [place]  [address] Phone: [phone]; Fax: [phone]

## 2021-11-21 ENCOUNTER — Encounter: Payer: Self-pay | Admitting: Physician Assistant

## 2021-11-21 ENCOUNTER — Ambulatory Visit: Payer: PPO | Attending: Physician Assistant | Admitting: Physician Assistant

## 2021-11-21 VITALS — BP 122/60 | HR 74 | Ht 67.0 in | Wt 191.2 lb

## 2021-11-21 DIAGNOSIS — I152 Hypertension secondary to endocrine disorders: Secondary | ICD-10-CM

## 2021-11-21 DIAGNOSIS — E1159 Type 2 diabetes mellitus with other circulatory complications: Secondary | ICD-10-CM

## 2021-11-21 DIAGNOSIS — Z720 Tobacco use: Secondary | ICD-10-CM

## 2021-11-21 DIAGNOSIS — E785 Hyperlipidemia, unspecified: Secondary | ICD-10-CM | POA: Diagnosis not present

## 2021-11-21 DIAGNOSIS — E1169 Type 2 diabetes mellitus with other specified complication: Secondary | ICD-10-CM

## 2021-11-21 DIAGNOSIS — I2510 Atherosclerotic heart disease of native coronary artery without angina pectoris: Secondary | ICD-10-CM

## 2021-11-21 NOTE — Assessment & Plan Note (Signed)
History of non-STEMI treated with a Taxus DES to the in 2004 and subsequent DES to the LAD secondary to in-stent restenosis in 2014.  He is doing well without angina.  He has been maintained on dual antiplatelet therapy for many years.  He continues to tolerate this.  Continue aspirin 81 mg daily, Lipitor 40 mg daily, Plavix 75 mg daily, Toprol-XL 25 mg daily, as needed nitroglycerin.  Follow-up 1 year.

## 2021-11-21 NOTE — Assessment & Plan Note (Signed)
Goal LDL < 70 (ideally < 55). LDL in July was 70.  His primary care provider increased his atorvastatin to 40 mg daily.  He has follow-up labs pending next year.  Continue Lipitor 40 mg daily.

## 2021-11-21 NOTE — Assessment & Plan Note (Signed)
We discussed strategies for quitting. He plans to get the nicotine patch. We also briefly discussed Chantix. He can try this if the nicotine patches are not successful.

## 2021-11-21 NOTE — Assessment & Plan Note (Signed)
Blood pressure is controlled.  Continue enalapril 2.5 mg daily, Toprol-XL 25 mg daily.

## 2021-11-21 NOTE — Patient Instructions (Signed)
Medication Instructions:  Your physician recommends that you continue on your current medications as directed. Please refer to the Current Medication list given to you today.   *If you need a refill on your cardiac medications before your next appointment, please call your pharmacy*   Lab Work: None ordered  If you have labs (blood work) drawn today and your tests are completely normal, you will receive your results only by: [place] (if you have MyChart) OR A paper copy in the mail If you have any lab test that is abnormal or we need to change your treatment, we will call you to review the results.   Testing/Procedures: None ordered   Follow-Up: At [facility], you and your health needs are our priority.  As part of our continuing mission to provide you with exceptional heart care, we have created designated Provider Care Teams.  These Care Teams include your primary Cardiologist (physician) and Advanced Practice Providers (APPs -  Physician Assistants and Nurse Practitioners) who all work together to provide you with the care you need, when you need it.  We recommend signing up for the patient portal called "MyChart".  Sign up information is provided on this After Visit Summary.  MyChart is used to connect with patients for Virtual Visits (Telemedicine).  Patients are able to view lab/test results, encounter notes, upcoming appointments, etc.  Non-urgent messages can be sent to your provider as well.   To learn more about what you can do with MyChart, go to [email or web address].    Your next appointment:   1 year(s)  The format for your next appointment:   In Person  Provider:   Sherren Mocha, MD  or Richardson Dopp, PA-C         Other Instructions   Important Information About Sugar

## 2021-11-24 ENCOUNTER — Other Ambulatory Visit: Payer: Self-pay | Admitting: Family Medicine

## 2021-11-24 ENCOUNTER — Other Ambulatory Visit (HOSPITAL_COMMUNITY): Payer: Self-pay

## 2021-11-24 MED ORDER — CANAGLIFLOZIN 300 MG PO TABS
300.0000 mg | ORAL_TABLET | Freq: Every day | ORAL | 0 refills | Status: DC
  Filled 2021-11-24: qty 30, 30d supply, fill #0
  Filled 2021-11-24 – 2022-02-19 (×4): qty 30, 30d supply, fill #1

## 2021-12-09 ENCOUNTER — Encounter: Payer: Self-pay | Admitting: Physician Assistant

## 2021-12-09 ENCOUNTER — Ambulatory Visit (INDEPENDENT_AMBULATORY_CARE_PROVIDER_SITE_OTHER): Payer: PPO | Admitting: Physician Assistant

## 2021-12-09 VITALS — BP 102/60 | HR 84 | Ht 67.0 in | Wt 191.0 lb

## 2021-12-09 DIAGNOSIS — R350 Frequency of micturition: Secondary | ICD-10-CM

## 2021-12-09 DIAGNOSIS — N3281 Overactive bladder: Secondary | ICD-10-CM | POA: Diagnosis not present

## 2021-12-09 LAB — BLADDER SCAN AMB NON-IMAGING: Scan Result: 86

## 2021-12-09 MED ORDER — MIRABEGRON ER 25 MG PO TB24: 25.0000 mg | ORAL_TABLET | Freq: Every day | ORAL | 0 refills | Status: DC

## 2021-12-09 NOTE — Progress Notes (Signed)
12/09/2021 2:19 PM   Jerome Rodriguez 09-04-1955 [id number]  CC: Chief Complaint  Patient presents with   Urinary Frequency    HPI: Jerome Rodriguez is a 66 y.o. male with PMH prostatitis, nephrolithiasis, urgency, and urge incontinence who presents today for symptom recheck on oxybutynin XL 10 mg daily.   Today he reports sniffing and symptomatic improvement on oxybutynin.  His urgency is greatly improved and his urge incontinence has resolved.  He has had significant constipation and dry mouth on this medication, and is taking an herbal remedy to help with the constipation, which is helping.  PVR 86 mL.  PMH: Past Medical History:  Diagnosis Date   Abnormal chest CT    a. 07/2012: scattered bilat noncalcified pulm nodules ranging in size from a few mm to a max of 60m in LLL - rec f/u in 3-6 mos.   CAD (coronary artery disease)    a. NSTEMI 10/04 => LHC: mLAD 99%=> Taxus DES to mLAD and Taxus DES to dLAD;  b. ETT-Myoview 11/2011: normal, EF 73%, no ischemia; c. 07/2012 Cath/PCI: LM <10, LAD 90 ISRp/99 ISRd (3.0x38 Promus DES), LCX 229m30-40d, RCA 50-60p, 30d, EF 55-65%.   Diabetes mellitus    Dyslipidemia    ED (erectile dysfunction)    History of kidney stones    HTN (hypertension)    Kidney stone    Myocardial infarction (HCPrivateer   Obesity    Smoker    CIGARS    Surgical History: Past Surgical History:  Procedure Laterality Date   APPENDECTOMY  1976   arthroscopic knee surgery Right    CORONARY STENT PLACEMENT     CYSTOSCOPY/URETEROSCOPY/HOLMIUM LASER/STENT PLACEMENT Left 08/04/2019   Procedure: CYSTOSCOPY/URETEROSCOPY/HOLMIUM LASER/STENT PLACEMENT;  Surgeon: SnBilley CoMD;  Location: [facility] ORS;  Service: Urology;  Laterality: Left;   LEFT HEART CATHETERIZATION WITH CORONARY ANGIOGRAM N/A 07/11/2012   Procedure: LEFT HEART CATHETERIZATION WITH CORONARY ANGIOGRAM;  Surgeon: Peter M JoMartiniqueMD;  [facility];  Service: Cardiovascular;  Laterality:  N/A;   right shoulder surgery      Home Medications:  Allergies as of 12/09/2021       Reactions   Lactose Intolerance (gi)    Prasugrel Other (See Comments)   dizziness        Medication List        Accurate as of December 09, 2021  2:19 PM. If you have any questions, ask your nurse or doctor.          STOP taking these medications    oxybutynin 10 MG 24 hr tablet Commonly known as: DITROPAN-XL Stopped by: SaDebroah LoopPA-C       TAKE these medications    aspirin EC 81 MG tablet Take 1 tablet (81 mg total) by mouth daily.   atorvastatin 40 MG tablet Commonly known as: LIPITOR Take 1 tablet (40 mg total) by mouth daily.   clopidogrel 75 MG tablet Commonly known as: PLAVIX Take 1 tablet (75 mg total) by mouth daily.   enalapril 2.5 MG tablet Commonly known as: VASOTEC Take 1 tablet (2.5 mg total) by mouth daily.   glucose blood test strip Commonly known as: TRUEtest Test 1 each by Other route 2 (two) times daily. Use as instructed (This is for Ture test DX: E11.9)   Invokana 300 MG Tabs tablet Generic drug: canagliflozin Take 1 tablet (300 mg total) by mouth daily before breakfast.   Lancets Misc. Misc 1 each by Does not  apply route 2 (two) times daily. (This is for ture test DX: E11.9)   loratadine 10 MG tablet Commonly known as: CLARITIN Take 10 mg by mouth daily.   metFORMIN 750 MG 24 hr tablet Commonly known as: GLUCOPHAGE-XR Take 1 tablet (750 mg total) by mouth in the morning and at bedtime.   metoprolol succinate 25 MG 24 hr tablet Commonly known as: TOPROL-XL Take 1 tablet (25 mg total) by mouth daily.   mirabegron ER 25 MG Tb24 tablet Commonly known as: MYRBETRIQ Take 1 tablet (25 mg total) by mouth daily. Started by: Debroah Loop, PA-C   multivitamin tablet Take 1 tablet by mouth daily.   nitroGLYCERIN 0.4 MG SL tablet Commonly known as: NITROSTAT Place 1 tablet (0.4 mg total) under the tongue every 5 (five)  minutes x 3 doses as needed for chest pain.        Allergies:  Allergies  Allergen Reactions   Lactose Intolerance (Gi)    Prasugrel Other (See Comments)    dizziness    Family History: Family History  Problem Relation Age of Onset   Heart attack Mother        died in sleep   Diabetic kidney disease Mother    Diabetic kidney disease Father    Heart attack Father    Heart failure Father     Social History:   reports that he has been smoking cigars. He has been exposed to tobacco smoke. He has never used smokeless tobacco. He reports that he does not drink alcohol and does not use drugs.  Physical Exam: BP 102/60   Pulse 84   Ht '5\' 7"'$  (1.702 m)   Wt 191 lb (86.6 kg)   BMI 29.91 kg/m   Constitutional:  Alert and oriented, no acute distress, nontoxic appearing HEENT: [place], AT Cardiovascular: No clubbing, cyanosis, or edema Respiratory: Normal respiratory effort, no increased work of breathing GI: Abdomen is soft, nontender, nondistended, no abdominal masses GU: No CVA tenderness Lymph: No cervical or inguinal lymphadenopathy Skin: No rashes, bruises or suspicious lesions Neurologic: Grossly intact, no focal deficits, moving all 4 extremities Psychiatric: Normal mood and affect  Laboratory Data: Results for orders placed or performed in visit on 12/09/21  Bladder Scan (Post Void Residual) in office  Result Value Ref Range   Scan Result 86    Assessment & Plan:   1. OAB (overactive bladder) Significant symptomatic improvement on oxybutynin XL 10 mg, however he is having, and anticholinergic side effects requiring additional supplements to treat these.  I recommended trying a beta 3 agonist instead to try to avoid the side effects and he was agreeable.  I gave him 4 weeks of Myrbetriq 25 mg samples and will plan for symptom recheck and PVR in 4 weeks.  He is in agreement with this plan. - Bladder Scan (Post Void Residual) in office - mirabegron ER (MYRBETRIQ) 25 MG TB24  tablet; Take 1 tablet (25 mg total) by mouth daily.  Dispense: 28 tablet; Refill: 0   Return in about 4 weeks (around 01/06/2022) for Symptom recheck with PVR.  Debroah Loop, PA-C  [facility] [address] [place], [place] [address] [phone]

## 2021-12-17 ENCOUNTER — Other Ambulatory Visit (HOSPITAL_COMMUNITY): Payer: Self-pay

## 2021-12-17 ENCOUNTER — Other Ambulatory Visit: Payer: Self-pay | Admitting: Family Medicine

## 2021-12-17 DIAGNOSIS — I2510 Atherosclerotic heart disease of native coronary artery without angina pectoris: Secondary | ICD-10-CM

## 2021-12-17 DIAGNOSIS — I152 Hypertension secondary to endocrine disorders: Secondary | ICD-10-CM

## 2021-12-17 MED ORDER — ENALAPRIL MALEATE 2.5 MG PO TABS
2.5000 mg | ORAL_TABLET | Freq: Every day | ORAL | 0 refills | Status: DC
  Filled 2021-12-17: qty 90, 90d supply, fill #0

## 2021-12-17 MED ORDER — METOPROLOL SUCCINATE ER 25 MG PO TB24
25.0000 mg | ORAL_TABLET | Freq: Every day | ORAL | 0 refills | Status: DC
  Filled 2021-12-17: qty 90, 90d supply, fill #0

## 2021-12-31 ENCOUNTER — Other Ambulatory Visit (HOSPITAL_COMMUNITY): Payer: Self-pay

## 2022-01-06 ENCOUNTER — Encounter: Payer: Self-pay | Admitting: Family Medicine

## 2022-01-06 ENCOUNTER — Telehealth: Payer: Self-pay | Admitting: Family Medicine

## 2022-01-06 ENCOUNTER — Other Ambulatory Visit (HOSPITAL_COMMUNITY): Payer: Self-pay

## 2022-01-06 ENCOUNTER — Ambulatory Visit (INDEPENDENT_AMBULATORY_CARE_PROVIDER_SITE_OTHER): Payer: PPO | Admitting: Family Medicine

## 2022-01-06 VITALS — BP 100/56 | HR 79 | Resp 14 | Ht 67.25 in | Wt 192.2 lb

## 2022-01-06 DIAGNOSIS — E785 Hyperlipidemia, unspecified: Secondary | ICD-10-CM | POA: Diagnosis not present

## 2022-01-06 DIAGNOSIS — E1159 Type 2 diabetes mellitus with other circulatory complications: Secondary | ICD-10-CM

## 2022-01-06 DIAGNOSIS — N200 Calculus of kidney: Secondary | ICD-10-CM

## 2022-01-06 DIAGNOSIS — E669 Obesity, unspecified: Secondary | ICD-10-CM

## 2022-01-06 DIAGNOSIS — Z0000 Encounter for general adult medical examination without abnormal findings: Secondary | ICD-10-CM

## 2022-01-06 DIAGNOSIS — E1169 Type 2 diabetes mellitus with other specified complication: Secondary | ICD-10-CM | POA: Diagnosis not present

## 2022-01-06 DIAGNOSIS — E118 Type 2 diabetes mellitus with unspecified complications: Secondary | ICD-10-CM | POA: Diagnosis not present

## 2022-01-06 DIAGNOSIS — I2510 Atherosclerotic heart disease of native coronary artery without angina pectoris: Secondary | ICD-10-CM

## 2022-01-06 DIAGNOSIS — N3281 Overactive bladder: Secondary | ICD-10-CM

## 2022-01-06 DIAGNOSIS — Z720 Tobacco use: Secondary | ICD-10-CM | POA: Diagnosis not present

## 2022-01-06 DIAGNOSIS — I152 Hypertension secondary to endocrine disorders: Secondary | ICD-10-CM

## 2022-01-06 DIAGNOSIS — E1121 Type 2 diabetes mellitus with diabetic nephropathy: Secondary | ICD-10-CM | POA: Diagnosis not present

## 2022-01-06 LAB — POCT UA - MICROALBUMIN
Albumin/Creatinine Ratio, Urine, POC: <9.1
Creatinine, POC: 55 mg/dL
Microalbumin Ur, POC: <5 mg/L

## 2022-01-06 LAB — POCT GLYCOSYLATED HEMOGLOBIN (HGB A1C): Hemoglobin A1C: 9.2 % — AB (ref 4.0–5.6)

## 2022-01-06 MED ORDER — CLOPIDOGREL BISULFATE 75 MG PO TABS
75.0000 mg | ORAL_TABLET | Freq: Every day | ORAL | 3 refills | Status: DC
  Filled 2022-01-06 – 2022-02-25 (×2): qty 90, 90d supply, fill #0
  Filled 2022-05-26: qty 90, 90d supply, fill #1
  Filled 2022-08-24: qty 90, 90d supply, fill #2
  Filled 2022-11-20: qty 90, 90d supply, fill #3

## 2022-01-06 MED ORDER — SOLIQUA 100-33 UNT-MCG/ML ~~LOC~~ SOPN
15.0000 [IU] | PEN_INJECTOR | Freq: Every day | SUBCUTANEOUS | 5 refills | Status: DC
  Filled 2022-01-06 – 2022-02-10 (×2): qty 12, 80d supply, fill #0
  Filled 2022-04-27: qty 12, 80d supply, fill #1
  Filled 2022-07-20: qty 12, 80d supply, fill #2
  Filled 2022-12-23 (×2): qty 12, 80d supply, fill #3

## 2022-01-06 MED ORDER — METOPROLOL SUCCINATE ER 25 MG PO TB24
25.0000 mg | ORAL_TABLET | Freq: Every day | ORAL | 0 refills | Status: DC
  Filled 2022-01-06 – 2022-03-20 (×2): qty 90, 90d supply, fill #0

## 2022-01-06 MED ORDER — ATORVASTATIN CALCIUM 40 MG PO TABS
40.0000 mg | ORAL_TABLET | Freq: Every day | ORAL | 3 refills | Status: DC
  Filled 2022-01-06 – 2022-02-25 (×2): qty 90, 90d supply, fill #0
  Filled 2022-05-26: qty 90, 90d supply, fill #1
  Filled 2022-08-25: qty 90, 90d supply, fill #2
  Filled 2022-12-03: qty 90, 90d supply, fill #3

## 2022-01-06 MED ORDER — METFORMIN HCL ER 750 MG PO TB24
750.0000 mg | ORAL_TABLET | Freq: Two times a day (BID) | ORAL | 1 refills | Status: DC
  Filled 2022-01-06: qty 180, 90d supply, fill #0
  Filled 2022-04-27: qty 180, 90d supply, fill #1

## 2022-01-06 MED ORDER — ENALAPRIL MALEATE 2.5 MG PO TABS
2.5000 mg | ORAL_TABLET | Freq: Every day | ORAL | 0 refills | Status: DC
  Filled 2022-01-06 – 2022-03-20 (×2): qty 90, 90d supply, fill #0

## 2022-01-06 NOTE — Patient Instructions (Addendum)
   These are the goals we discussed:  Goals      Patient Stated     03/21/2021, wants to lose weight, wants to weigh 180 pounds      Start checking your blood sugars in the morning and taking it also 2 hours after a meal.  Take the shot daily.  Change it in a different spot gag This is a list of the screening recommended for you and due dates:  Health Maintenance  Topic Date Due   Flu Shot  04/05/2022*   Medicare Annual Wellness Visit  03/22/2022   Hemoglobin A1C  07/07/2022   Yearly kidney function blood test for diabetes  07/25/2022   Yearly kidney health urinalysis for diabetes  07/25/2022   Complete foot exam   07/25/2022   Eye exam for diabetics  08/05/2022   Cologuard (Stool DNA test)  06/26/2023   DTaP/Tdap/Td vaccine (3 - Td or Tdap) 09/13/2029   COVID-19 Vaccine  Completed   Hepatitis C Screening: USPSTF Recommendation to screen - Ages 72-79 yo.  Completed   Zoster (Shingles) Vaccine  Completed   HPV Vaccine  Aged Out   Pneumonia Vaccine  Discontinued  *Topic was postponed. The date shown is not the original due date.

## 2022-01-06 NOTE — Telephone Encounter (Signed)
Pt called & states he got notice that his Tiering exception for Invokana has expired,  insurance same, said he called insurance & they are supposed to contact us. This reduces the cost to $90 for #90.  I called insurance t# [phone] HTA & they have started the Tiering exception & will call back in 2 days.  Pt given samples of Invokana 300 mg to hold

## 2022-01-06 NOTE — Progress Notes (Signed)
Jerome Rodriguez is a 67 y.o. male who presents for annual wellness visit,CPE and follow-up on chronic medical conditions.  He has been trying to lose weight.  He has seen his ophthalmologist.  Continues to wear hearing aid.  He does have an appointment set up with urology to follow-up on his OAB apparently he was given Myrbetriq and apparently was on a different medication but it caused too many side effects.  He continues on enalapril, metoprolol and Plavix.  He is taking Invokana and metformin.  He does have concerns over Invokana being very expensive.  He is also taking atorvastatin and having no difficulty with that.  He does check his blood sugars periodically.  Continues to work as a Architect. He has no other concerns or complaints.  He is not interested in the flu shot.  Immunizations and Health Maintenance Immunization History  Administered Date(s) Administered   COVID-19, mRNA, vaccine(Comirnaty)12 years and older 10/27/2021   Influenza Split 11/17/2010   PFIZER Comirnaty(Gray Top)Covid-19 Tri-Sucrose Vaccine 06/04/2020   PFIZER(Purple Top)SARS-COV-2 Vaccination 03/27/2019, 04/19/2019, 10/19/2019   Pneumococcal Conjugate-13 07/30/2014   Pneumococcal Polysaccharide-23 05/04/2007   Tdap 05/04/2007, 09/14/2019   Zoster Recombinat (Shingrix) 09/14/2019, 11/24/2019   Zoster, Live 07/30/2014   There are no preventive care reminders to display for this patient.   Last colonoscopy: Cologuard 06/25/2020 Last PSA: 11/22/2019 result 0.2 Dentist: more than 1 year ago Ophtho: within the last year Exercise: walking 1 hour daily    Other doctors caring for patient include: Denita Lung, MD PCP  Sherren Mocha, MD Cardiology    Advanced Directives: yes  Copy asked for  Depression screen:  See questionnaire below.        01/06/2022    1:55 PM 03/21/2021    3:19 PM 06/04/2020    8:37 AM 05/30/2019   10:37 AM 10/22/2017    8:38 AM  Depression screen PHQ 2/9  Decreased  Interest 0 0 0 0 0  Down, Depressed, Hopeless 0 0 0 0 0  PHQ - 2 Score 0 0 0 0 0    Fall Screen: See Questionaire below.      03/21/2021    3:19 PM 06/04/2020    8:37 AM 05/30/2019   10:37 AM  Fall Risk   Falls in the past year? 0 0 0  Number falls in past yr:  0   Injury with Fall?  0   Risk for fall due to : Medication side effect History of fall(s)   Follow up Falls evaluation completed;Education provided;Falls prevention discussed Falls prevention discussed     ADL screen:  See questionnaire below.  Functional Status Survey: Is the patient deaf or have difficulty hearing?: No Does the patient have difficulty seeing, even when wearing glasses/contacts?: No Does the patient have difficulty concentrating, remembering, or making decisions?: No Does the patient have difficulty walking or climbing stairs?: No Does the patient have difficulty dressing or bathing?: No Does the patient have difficulty doing errands alone such as visiting a doctor's office or shopping?: No   Review of Systems  Constitutional: -, -unexpected weight change, -anorexia, -fatigue Allergy: -sneezing, -itching, -congestion Dermatology: denies changing moles, rash, lumps ENT: -runny nose, -ear pain, -sore throat,  Cardiology:  -chest pain, -palpitations, -orthopnea, Respiratory: -cough, -shortness of breath, -dyspnea on exertion, -wheezing,  Gastroenterology: -abdominal pain, -nausea, -vomiting, -diarrhea, -constipation, -dysphagia Hematology: -bleeding or bruising problems Musculoskeletal: -arthralgias, -myalgias, -joint swelling, -back pain, - Ophthalmology: -vision changes,  Urology: -dysuria, -difficulty urinating,  -urinary  frequency, -urgency, incontinence Neurology: -, -numbness, , -memory loss, -falls, -dizziness    PHYSICAL EXAM:  BP (!) 100/56   Pulse 79   Resp 14   Ht 5' 7.25" (1.708 m)   Wt 192 lb 3.2 oz (87.2 kg)   SpO2 96% Comment: room air  BMI 29.88 kg/m   General Appearance:  Alert, cooperative, no distress, appears stated age Head: Normocephalic, without obvious abnormality, atraumatic Eyes: PERRL, conjunctiva/corneas clear, EOM's intact,  Ears: Normal TM's and external ear canals Nose: Nares normal, mucosa normal, no drainage or sinus   tenderness Throat: Lips, mucosa, and tongue normal; teeth and gums normal Neck: Supple, no lymphadenopathy, thyroid:no enlargement/tenderness/nodules; no carotid bruit or JVD Lungs: Clear to auscultation bilaterally without wheezes, rales or ronchi; respirations unlabored Heart: Regular rate and rhythm, S1 and S2 normal, no murmur, rub or gallop Abdomen: Soft, non-tender, nondistended, normoactive bowel sounds, no masses, no hepatosplenomegaly Skin: Skin color, texture, turgor normal, no rashes or lesions Lymph nodes: Cervical, supraclavicular, and axillary nodes normal Neurologic: CNII-XII intact, normal strength, sensation and gait; reflexes 2+ and symmetric throughout   Psych: Normal mood, affect, hygiene and grooming  ASSESSMENT/PLAN: Routine general medical examination at a health care facility - Plan: CBC with Differential/Platelet, Comprehensive metabolic panel, Lipid panel  Coronary artery disease involving native coronary artery of native heart without angina pectoris - Plan: CBC with Differential/Platelet, Comprehensive metabolic panel, Lipid panel  Hypertension associated with diabetes ([place]) - Plan: enalapril (VASOTEC) 2.5 MG tablet, metoprolol succinate (TOPROL-XL) 25 MG 24 hr tablet, CBC with Differential/Platelet, Comprehensive metabolic panel  Diabetic nephropathy with proteinuria (HCC)  Hyperlipidemia associated with type 2 diabetes mellitus ([place]) - Plan: atorvastatin (LIPITOR) 40 MG tablet, Lipid panel  Type 2 diabetes with complication (HCC) - Plan: metFORMIN (GLUCOPHAGE-XR) 750 MG 24 hr tablet, POCT UA - Microalbumin, POCT glycosylated hemoglobin (Hb A1C), Insulin Glargine-Lixisenatide (SOLIQUA) 100-33  UNT-MCG/ML SOPN  OAB (overactive bladder)  Obesity (BMI 30-39.9)  Renal stones  Tobacco abuse  ASHD (arteriosclerotic heart disease) - Plan: clopidogrel (PLAVIX) 75 MG tablet  Atherosclerosis of native coronary artery of native heart without angina pectoris - Plan: metoprolol succinate (TOPROL-XL) 25 MG 24 hr tablet, Lipid panel   Continue on medication regimen.  I will add Soliqua to the regimens since I can get that of the less expensive cost to him.  He is to keep track of his blood sugars at least twice per day and bring them in with him in about 2 weeks so we can review this with him. Recommended at least 30 minutes of aerobic activity at least 5 days/week;  reviewed; healthy diet and alcohol recommendations (less than or equal to 2 drinks/day) Immunization recommendations discussed.  Colonoscopy recommendations reviewed. Follow-up with urology concerning his OAB issues.  Medicare Attestation I have personally reviewed: The patient's medical and social history Their use of alcohol, tobacco or illicit drugs Their current medications and supplements The patient's functional ability including ADLs,fall risks, home safety risks, cognitive, and hearing and visual impairment Diet and physical activities Evidence for depression or mood disorders  The patient's weight, height, and BMI have been recorded in the chart.  I have made referrals, counseling, and provided education to the patient based on review of the above and I have provided the patient with a written personalized care plan for preventive services.     Jill Alexanders, MD   01/06/2022

## 2022-01-07 LAB — LIPID PANEL
Chol/HDL Ratio: 2.9 ratio (ref 0.0–5.0)
Cholesterol, Total: 155 mg/dL (ref 100–199)
HDL: 54 mg/dL (ref >39)
LDL Chol Calc (NIH): 81 mg/dL (ref 0–99)
Triglycerides: 111 mg/dL (ref 0–149)
VLDL Cholesterol Cal: 20 mg/dL (ref 5–40)

## 2022-01-07 LAB — CBC WITH DIFFERENTIAL/PLATELET
Basophils Absolute: 0.1 10*3/uL (ref 0.0–0.2)
Basos: 1 %
EOS (ABSOLUTE): 0.2 10*3/uL (ref 0.0–0.4)
Eos: 2 %
Hematocrit: 46.5 % (ref 37.5–51.0)
Hemoglobin: 15.7 g/dL (ref 13.0–17.7)
Immature Grans (Abs): 0 10*3/uL (ref 0.0–0.1)
Immature Granulocytes: 0 %
Lymphocytes Absolute: 2.1 10*3/uL (ref 0.7–3.1)
Lymphs: 29 %
MCH: 29.8 pg (ref 26.6–33.0)
MCHC: 33.8 g/dL (ref 31.5–35.7)
MCV: 88 fL (ref 79–97)
Monocytes Absolute: 0.6 10*3/uL (ref 0.1–0.9)
Monocytes: 8 %
Neutrophils Absolute: 4.6 10*3/uL (ref 1.4–7.0)
Neutrophils: 60 %
Platelets: 258 10*3/uL (ref 150–450)
RBC: 5.26 x10E6/uL (ref 4.14–5.80)
RDW: 13.1 % (ref 11.6–15.4)
WBC: 7.5 10*3/uL (ref 3.4–10.8)

## 2022-01-07 LAB — COMPREHENSIVE METABOLIC PANEL
ALT: 16 IU/L (ref 0–44)
AST: 14 IU/L (ref 0–40)
Albumin/Globulin Ratio: 2.2 (ref 1.2–2.2)
Albumin: 4.6 g/dL (ref 3.9–4.9)
Alkaline Phosphatase: 156 IU/L — ABNORMAL HIGH (ref 44–121)
BUN/Creatinine Ratio: 19 (ref 10–24)
BUN: 18 mg/dL (ref 8–27)
Bilirubin Total: 0.5 mg/dL (ref 0.0–1.2)
CO2: 23 mmol/L (ref 20–29)
Calcium: 9.5 mg/dL (ref 8.6–10.2)
Chloride: 98 mmol/L (ref 96–106)
Creatinine, Ser: 0.97 mg/dL (ref 0.76–1.27)
Globulin, Total: 2.1 g/dL (ref 1.5–4.5)
Glucose: 253 mg/dL — ABNORMAL HIGH (ref 70–99)
Potassium: 4.3 mmol/L (ref 3.5–5.2)
Sodium: 135 mmol/L (ref 134–144)
Total Protein: 6.7 g/dL (ref 6.0–8.5)
eGFR: 86 mL/min/{1.73_m2} (ref >59)

## 2022-01-09 ENCOUNTER — Ambulatory Visit: Payer: PPO | Admitting: Physician Assistant

## 2022-01-12 ENCOUNTER — Other Ambulatory Visit: Payer: Self-pay | Admitting: Family Medicine

## 2022-01-12 ENCOUNTER — Other Ambulatory Visit (HOSPITAL_COMMUNITY): Payer: Self-pay

## 2022-01-12 MED ORDER — RSVPREF3 VAC RECOMB ADJUVANTED 120 MCG/0.5ML IM SUSR
0.5000 mL | Freq: Once | INTRAMUSCULAR | 0 refills | Status: AC
  Filled 2022-01-12: qty 0.5, 1d supply, fill #0

## 2022-01-13 ENCOUNTER — Other Ambulatory Visit: Payer: Self-pay

## 2022-01-13 ENCOUNTER — Other Ambulatory Visit (HOSPITAL_COMMUNITY): Payer: Self-pay

## 2022-01-13 DIAGNOSIS — E118 Type 2 diabetes mellitus with unspecified complications: Secondary | ICD-10-CM

## 2022-01-13 MED ORDER — INSULIN PEN NEEDLE 32G X 4 MM MISC
3 refills | Status: DC
  Filled 2022-01-13: qty 100, 100d supply, fill #0
  Filled 2022-02-10: qty 100, 90d supply, fill #0
  Filled 2022-05-26: qty 100, 90d supply, fill #1
  Filled 2022-08-25: qty 100, 90d supply, fill #2
  Filled 2022-12-11 (×2): qty 100, 90d supply, fill #3

## 2022-01-16 ENCOUNTER — Other Ambulatory Visit (HOSPITAL_COMMUNITY): Payer: Self-pay

## 2022-01-16 ENCOUNTER — Ambulatory Visit (INDEPENDENT_AMBULATORY_CARE_PROVIDER_SITE_OTHER): Payer: PPO | Admitting: Physician Assistant

## 2022-01-16 VITALS — BP 101/63 | HR 76 | Wt 191.4 lb

## 2022-01-16 DIAGNOSIS — N3281 Overactive bladder: Secondary | ICD-10-CM

## 2022-01-16 LAB — BLADDER SCAN AMB NON-IMAGING: Scan Result: 14

## 2022-01-16 MED ORDER — MIRABEGRON ER 50 MG PO TB24: 50.0000 mg | ORAL_TABLET | Freq: Every day | ORAL | 0 refills | Status: DC

## 2022-01-16 NOTE — Progress Notes (Signed)
01/16/2022 4:18 PM   Jerome Rodriguez 1955/09/27 [id number]  CC: Chief Complaint  Patient presents with   Nocturia   HPI: Jerome Rodriguez is a 67 y.o. male with PMH prostatitis, nephrolithiasis, urgency, and urge incontinence who developed anticholinergic side effects on oxybutynin XL 10 mg daily who presents today for symptom recheck on Myrbetriq 25 mg.  Today he reports his symptoms are better than at baseline, however not as well-managed as they were on oxybutynin.  That said, his previously reported constipation and dry mouth have resolved and he is tolerating the medication much better.  PVR 14 mL.   PMH: Past Medical History:  Diagnosis Date   Abnormal chest CT    a. 07/2012: scattered bilat noncalcified pulm nodules ranging in size from a few mm to a max of 73m in LLL - rec f/u in 3-6 mos.   CAD (coronary artery disease)    a. NSTEMI 10/04 => LHC: mLAD 99%=> Taxus DES to mLAD and Taxus DES to dLAD;  b. ETT-Myoview 11/2011: normal, EF 73%, no ischemia; c. 07/2012 Cath/PCI: LM <10, LAD 90 ISRp/99 ISRd (3.0x38 Promus DES), LCX 264m30-40d, RCA 50-60p, 30d, EF 55-65%.   Diabetes mellitus    Dyslipidemia    ED (erectile dysfunction)    History of kidney stones    HTN (hypertension)    Kidney stone    [place]   Obesity    Smoker    CIGARS    Surgical History: Past Surgical History:  Procedure Laterality Date   APPENDECTOMY  1976   arthroscopic knee surgery Right    CORONARY STENT PLACEMENT     CYSTOSCOPY/URETEROSCOPY/HOLMIUM LASER/STENT PLACEMENT Left 08/04/2019   Procedure: CYSTOSCOPY/URETEROSCOPY/HOLMIUM LASER/STENT PLACEMENT;  Surgeon: SnBilley CoMD;  Location: [facility] ORS;  Service: Urology;  Laterality: Left;   LEFT HEART CATHETERIZATION WITH CORONARY ANGIOGRAM N/A 07/11/2012   Procedure: LEFT HEART CATHETERIZATION WITH CORONARY ANGIOGRAM;  Surgeon: Peter M JoMartiniqueMD;  [facility];  Service: Cardiovascular;  Laterality: N/A;    right shoulder surgery      Home Medications:  Allergies as of 01/16/2022       Reactions   Lactose Intolerance (gi)    Prasugrel Other (See Comments)   dizziness        Medication List        Accurate as of January 16, 2022  4:18 PM. If you have any questions, ask your nurse or doctor.          Arexvy 120 MCG/0.5ML injection Generic drug: RSV vaccine recomb adjuvanted Inject 0.5 mLs into the muscle once for 1 dose.   aspirin EC 81 MG tablet Take 1 tablet (81 mg total) by mouth daily.   atorvastatin 40 MG tablet Commonly known as: LIPITOR Take 1 tablet (40 mg total) by mouth daily.   clopidogrel 75 MG tablet Commonly known as: PLAVIX Take 1 tablet (75 mg total) by mouth daily.   enalapril 2.5 MG tablet Commonly known as: VASOTEC Take 1 tablet (2.5 mg total) by mouth daily.   glucose blood test strip Commonly known as: TRUEtest Test 1 each by Other route 2 (two) times daily. Use as instructed (This is for Ture test DX: E11.9)   Insulin Pen Needle 32G X 4 MM Misc Use daily as directed   Invokana 300 MG Tabs tablet Generic drug: canagliflozin Take 1 tablet (300 mg total) by mouth daily before breakfast.   Lancets Misc. Misc 1 each by Does  not apply route 2 (two) times daily. (This is for ture test DX: E11.9)   loratadine 10 MG tablet Commonly known as: CLARITIN Take 10 mg by mouth daily.   metFORMIN 750 MG 24 hr tablet Commonly known as: GLUCOPHAGE-XR Take 1 tablet (750 mg total) by mouth in the morning and at bedtime.   metoprolol succinate 25 MG 24 hr tablet Commonly known as: TOPROL-XL Take 1 tablet (25 mg total) by mouth daily.   mirabegron ER 50 MG Tb24 tablet Commonly known as: MYRBETRIQ Take 1 tablet (50 mg total) by mouth daily. What changed:  medication strength how much to take Changed by: Debroah Loop, PA-C   multivitamin tablet Take 1 tablet by mouth daily.   nitroGLYCERIN 0.4 MG SL tablet Commonly known as:  NITROSTAT Place 1 tablet (0.4 mg total) under the tongue every 5 (five) minutes x 3 doses as needed for chest pain.   Soliqua 100-33 UNT-MCG/ML Sopn Generic drug: Insulin Glargine-Lixisenatide Inject 15 Units into the skin daily.        Allergies:  Allergies  Allergen Reactions   Lactose Intolerance (Gi)    Prasugrel Other (See Comments)    dizziness    Family History: Family History  Problem Relation Age of Onset   Heart attack Mother        died in sleep   Diabetic kidney disease Mother    Diabetic kidney disease Father    Heart attack Father    Heart failure Father     Social History:   reports that he has been smoking cigars. He has been exposed to tobacco smoke. He has never used smokeless tobacco. He reports that he does not drink alcohol and does not use drugs.  Physical Exam: BP 101/63   Pulse 76   Wt 191 lb 6 oz (86.8 kg)   BMI 29.75 kg/m   Constitutional:  Alert and oriented, no acute distress, nontoxic appearing HEENT: [place], AT Cardiovascular: No clubbing, cyanosis, or edema Respiratory: Normal respiratory effort, no increased work of breathing Skin: No rashes, bruises or suspicious lesions Neurologic: Grossly intact, no focal deficits, moving all 4 extremities Psychiatric: Normal mood and affect  Laboratory Data: Results for orders placed or performed in visit on 01/16/22  Bladder Scan (Post Void Residual) in office  Result Value Ref Range   Scan Result 14 ml    Assessment & Plan:   1. OAB (overactive bladder) Inferior therapeutic response Myrbetriq 25 mg compared to oxybutynin, however his anticholinergic side effects have resolved.  Will increase to Myrbetriq 50 mg daily and pursue symptom recheck and PVR in 1 month.  Is in agreement with this plan. - Bladder Scan (Post Void Residual) in office - mirabegron ER (MYRBETRIQ) 50 MG TB24 tablet; Take 1 tablet (50 mg total) by mouth daily.  Dispense: 28 tablet; Refill: 0  Return in about 4 weeks  (around 02/13/2022) for Symptom recheck with PVR.  Debroah Loop, PA-C  [facility] [address], [place], [place] [address] [phone]

## 2022-01-21 ENCOUNTER — Other Ambulatory Visit (HOSPITAL_COMMUNITY): Payer: Self-pay

## 2022-01-21 ENCOUNTER — Ambulatory Visit (INDEPENDENT_AMBULATORY_CARE_PROVIDER_SITE_OTHER): Payer: PPO | Admitting: Family Medicine

## 2022-01-21 ENCOUNTER — Encounter: Payer: Self-pay | Admitting: Family Medicine

## 2022-01-21 VITALS — BP 110/60 | HR 76 | Resp 18 | Wt 196.4 lb

## 2022-01-21 DIAGNOSIS — E118 Type 2 diabetes mellitus with unspecified complications: Secondary | ICD-10-CM | POA: Diagnosis not present

## 2022-01-21 MED ORDER — FREESTYLE LIBRE 14 DAY SENSOR MISC
1.0000 | 1 refills | Status: DC
  Filled 2022-01-21: qty 6, 84d supply, fill #0

## 2022-01-21 NOTE — Progress Notes (Signed)
Subjective:    Patient ID: Jerome Rodriguez, male    DOB: [birth year], 67 y.o.   MRN: [id number]  Jerome Rodriguez is a 67 y.o. male who presents for follow-up of Type 2 diabetes mellitus.  Home blood sugar records: fasting range: 140-150 average Current symptoms/problems include none and have been stable. Daily foot checks: yes   Any foot concerns: no How often blood sugars checked: twice a day Exercise: Home exercise routine includes walking 1 hour daily. Diet: diabetic diet He has been checking his blood sugars more regularly and has been happy with the results that he has seen since he is on [place].  Today's to follow-up on him checking his blood sugars more regularly. The following portions of the patient's history were reviewed and updated as appropriate: allergies, current medications, past medical history, past social history and problem list.  ROS as in subjective above.     Objective:    Physical Exam Alert and in no distress otherwise not examined.  Blood pressure 110/60, pulse 76, resp. rate 18, weight 196 lb 6.4 oz (89.1 kg), SpO2 97 %.  Lab Review    Latest Ref Rng & Units 01/06/2022    3:18 PM 01/06/2022    3:06 PM 01/06/2022    2:16 PM 07/24/2021    1:57 PM 07/24/2021   12:03 PM  Diabetic Labs  HbA1c 4.0 - 5.6 %   9.2  7.8    Microalbumin mg/L <5.0    <5.0    Micro/Creat Ratio  <9.1    <5.7    Chol 100 - 199 mg/dL  155    133   HDL >39 mg/dL  54    43   Calc LDL 0 - 99 mg/dL  81    70   Triglycerides 0 - 149 mg/dL  111    110   Creatinine 0.76 - 1.27 mg/dL  0.97    1.01       01/21/2022   10:07 AM 01/16/2022    9:32 AM 01/06/2022    1:45 PM 12/09/2021   11:04 AM 11/21/2021   10:12 AM  BP/Weight  Systolic BP 903 009 233 007 622  Diastolic BP 60 63 56 60 60  Wt. (Lbs) 196.4 191.38 192.2 191 191.2  BMI 30.53 kg/m2 29.75 kg/m2 29.88 kg/m2 29.91 kg/m2 29.95 kg/m2      Latest Ref Rng & Units 08/04/2021   12:00 AM 07/24/2021   11:00 AM  Foot/eye exam  completion dates  Eye Exam No Retinopathy No Retinopathy       Foot Form Completion   Done     This result is from an external source.    Lavaughn  reports that he has been smoking cigars. He has been exposed to tobacco smoke. He has never used smokeless tobacco. He reports that he does not drink alcohol and does not use drugs.     Assessment & Plan:    Type 2 diabetes with complication ([place]) - Plan: Continuous Blood Gluc Sensor (FREESTYLE LIBRE 14 DAY SENSOR) MISC  Since he is now on insulin with Soliqua, I will switch him over to freestyle libre 3.  I explained that this will give [place] a much better feel for exactly how he is doing.  I again reinforced the need for him to make diet and exercise changes to be more consistent with this.  He was given instructions on how to use the device the initial device was placed on.  He is  to return here in 2 weeks and we will watch him put the other device on and look at his blood sugar readings.

## 2022-02-04 ENCOUNTER — Ambulatory Visit (INDEPENDENT_AMBULATORY_CARE_PROVIDER_SITE_OTHER): Payer: PPO | Admitting: Family Medicine

## 2022-02-04 ENCOUNTER — Other Ambulatory Visit (HOSPITAL_COMMUNITY): Payer: Self-pay

## 2022-02-04 ENCOUNTER — Encounter: Payer: Self-pay | Admitting: Family Medicine

## 2022-02-04 VITALS — BP 110/70 | HR 70 | Wt 194.4 lb

## 2022-02-04 DIAGNOSIS — E118 Type 2 diabetes mellitus with unspecified complications: Secondary | ICD-10-CM | POA: Diagnosis not present

## 2022-02-04 NOTE — Progress Notes (Signed)
   Subjective:    Patient ID: Jerome Rodriguez, male    DOB: [birth year], 67 y.o.   MRN: [id number]  HPI He is here for consult concerning using the freestyle libre.   Review of Systems     Objective:   Physical Exam Alert and in no distress otherwise not examined       Assessment & Plan:  Type 2 diabetes with complication ([place]) - Plan: Amb Referral to Nutrition and Diabetic Education I went over the information with him and its quite obvious that his lunches of the main culprit.  He seems to be eating fast foods a lot and has made some changes in terms of cutting back on [place] fries but still seeing spikes.  He has made some effort so I think getting him into see diabetes and nutrition would be appropriate.  I will monitor this and once he is been in to see them I will check him roughly 1 month after he has been on there.

## 2022-02-05 ENCOUNTER — Telehealth: Payer: Self-pay | Admitting: Family Medicine

## 2022-02-05 ENCOUNTER — Other Ambulatory Visit (HOSPITAL_COMMUNITY): Payer: Self-pay

## 2022-02-05 MED ORDER — FREESTYLE LIBRE 3 SENSOR MISC
1 refills | Status: DC
  Filled 2022-02-05: qty 6, 84d supply, fill #0
  Filled 2022-04-27: qty 6, 84d supply, fill #1

## 2022-02-05 NOTE — Telephone Encounter (Signed)
I have sent in [place] 3 sensors

## 2022-02-05 NOTE — Telephone Encounter (Signed)
Pt walked in and stated the 14 day free style Elenor [place] is not working good. It keeps locking him out and not continuously monitoring. He says he prefers a free style libre 3.

## 2022-02-10 ENCOUNTER — Other Ambulatory Visit (HOSPITAL_COMMUNITY): Payer: Self-pay

## 2022-02-19 ENCOUNTER — Other Ambulatory Visit (HOSPITAL_COMMUNITY): Payer: Self-pay

## 2022-02-19 ENCOUNTER — Telehealth: Payer: Self-pay | Admitting: Family Medicine

## 2022-02-19 NOTE — Telephone Encounter (Signed)
P.A. INVOKANA completed & approved over the phone t# [phone] til 12/24 PA# [id number], he "said" automatically included the tiering exception. Called pharmacy Invokana 361m #30 went thru but cost is still $100.  I tried Pt Assistance last year & he wasn't approved. Pt can't afford this do you have any other options for this patient?

## 2022-02-19 NOTE — Telephone Encounter (Signed)
Jerome Rodriguez is non-formulary this year. PA completed and approved; however, non-formulary drugs are not elgible for tier exception. Notified patient.   Pt states he currently has enough Invokana to get him through the next month at least and will consider options at that time. Wilder [place]ade would be $47/month on patient's insurance but he declines the switch at this time as he reports a "bad reaction that he cannot specifically recall." Plans to discuss at his appt on 04/09/22. Instructed patient to contact office/pharmacist sooner if anything changes. He understood.

## 2022-02-20 ENCOUNTER — Ambulatory Visit (INDEPENDENT_AMBULATORY_CARE_PROVIDER_SITE_OTHER): Payer: PPO | Admitting: Physician Assistant

## 2022-02-20 ENCOUNTER — Other Ambulatory Visit (HOSPITAL_COMMUNITY): Payer: Self-pay

## 2022-02-20 VITALS — BP 106/65 | HR 79 | Ht 67.0 in | Wt 189.2 lb

## 2022-02-20 DIAGNOSIS — N3281 Overactive bladder: Secondary | ICD-10-CM

## 2022-02-20 LAB — BLADDER SCAN AMB NON-IMAGING: Scan Result: 0

## 2022-02-20 MED ORDER — MIRABEGRON ER 50 MG PO TB24
50.0000 mg | ORAL_TABLET | Freq: Every day | ORAL | 3 refills | Status: DC
  Filled 2022-02-20: qty 90, 90d supply, fill #0
  Filled 2022-05-26: qty 90, 90d supply, fill #1
  Filled 2022-08-24: qty 90, 90d supply, fill #2
  Filled 2022-09-30 – 2022-10-01 (×2): qty 30, 30d supply, fill #2
  Filled 2022-10-27: qty 30, 30d supply, fill #3

## 2022-02-20 NOTE — Progress Notes (Signed)
02/20/2022 9:57 AM   Jerome Rodriguez November 02, 1955 [id number]  CC: Chief Complaint  Patient presents with   Over Active Bladder    HPI: Jerome Rodriguez is a 67 y.o. male with PMH prostatitis, nephrolithiasis, diabetes on canagliflozin, and OAB wet who presents today for recheck after increasing dose to Myrbetriq 50 mg daily.   Today he reports significant symptomatic improvement on the increased dose of Myrbetriq.  He reports daytime frequency x 3-4, nocturia x 1, and some persistent urge.  He notes that he still has occasional days where his symptoms are little bit worse, however still improved over baseline.  PVR 0 mL.  PMH: Past Medical History:  Diagnosis Date   Abnormal chest CT    a. 07/2012: scattered bilat noncalcified pulm nodules ranging in size from a few mm to a max of 37m in LLL - rec f/u in 3-6 mos.   CAD (coronary artery disease)    a. NSTEMI 10/04 => LHC: mLAD 99%=> Taxus DES to mLAD and Taxus DES to dLAD;  b. ETT-Myoview 11/2011: normal, EF 73%, no ischemia; c. 07/2012 Cath/PCI: LM <10, LAD 90 ISRp/99 ISRd (3.0x38 Promus DES), LCX 219m30-40d, RCA 50-60p, 30d, EF 55-65%.   Diabetes mellitus    Dyslipidemia    ED (erectile dysfunction)    History of kidney stones    HTN (hypertension)    Kidney stone    [place]   Obesity    Smoker    CIGARS    Surgical History: Past Surgical History:  Procedure Laterality Date   APPENDECTOMY  1976   arthroscopic knee surgery Right    CORONARY STENT PLACEMENT     CYSTOSCOPY/URETEROSCOPY/HOLMIUM LASER/STENT PLACEMENT Left 08/04/2019   Procedure: CYSTOSCOPY/URETEROSCOPY/HOLMIUM LASER/STENT PLACEMENT;  Surgeon: SnBilley CoMD;  Location: [facility] ORS;  Service: Urology;  Laterality: Left;   LEFT HEART CATHETERIZATION WITH CORONARY ANGIOGRAM N/A 07/11/2012   Procedure: LEFT HEART CATHETERIZATION WITH CORONARY ANGIOGRAM;  Surgeon: Peter M JoMartiniqueMD;  [facility];  Service: Cardiovascular;   Laterality: N/A;   right shoulder surgery      Home Medications:  Allergies as of 02/20/2022       Reactions   Lactose Intolerance (gi)    Prasugrel Other (See Comments)   dizziness        Medication List        Accurate as of February 20, 2022  9:57 AM. If you have any questions, ask your nurse or doctor.          STOP taking these medications    glucose blood test strip Commonly known as: TRUEtest Test   Lancets Misc. Misc       TAKE these medications    aspirin EC 81 MG tablet Take 1 tablet (81 mg total) by mouth daily.   atorvastatin 40 MG tablet Commonly known as: LIPITOR Take 1 tablet (40 mg total) by mouth daily.   clopidogrel 75 MG tablet Commonly known as: PLAVIX Take 1 tablet (75 mg total) by mouth daily.   enalapril 2.5 MG tablet Commonly known as: VASOTEC Take 1 tablet (2.5 mg total) by mouth daily.   FreeStyle Libre 3 Sensor Misc Place 1 sensor on the skin every 14 days. Use to check glucose continuously   Invokana 300 MG Tabs tablet Generic drug: canagliflozin Take 1 tablet (300 mg total) by mouth daily before breakfast.   loratadine 10 MG tablet Commonly known as: CLARITIN Take 10 mg by mouth daily.  metFORMIN 750 MG 24 hr tablet Commonly known as: GLUCOPHAGE-XR Take 1 tablet (750 mg total) by mouth in the morning and at bedtime.   metoprolol succinate 25 MG 24 hr tablet Commonly known as: TOPROL-XL Take 1 tablet (25 mg total) by mouth daily.   mirabegron ER 50 MG Tb24 tablet Commonly known as: MYRBETRIQ Take 1 tablet (50 mg total) by mouth daily.   multivitamin tablet Take 1 tablet by mouth daily.   nitroGLYCERIN 0.4 MG SL tablet Commonly known as: NITROSTAT Place 1 tablet (0.4 mg total) under the tongue every 5 (five) minutes x 3 doses as needed for chest pain.   Soliqua 100-33 UNT-MCG/ML Sopn Generic drug: Insulin Glargine-Lixisenatide Inject 15 Units into the skin daily.   TechLite Pen Needles 32G X 4 MM  Misc Generic drug: Insulin Pen Needle Use daily as directed        Allergies:  Allergies  Allergen Reactions   Lactose Intolerance (Gi)    Prasugrel Other (See Comments)    dizziness    Family History: Family History  Problem Relation Age of Onset   Heart attack Mother        died in sleep   Diabetic kidney disease Mother    Diabetic kidney disease Father    Heart attack Father    Heart failure Father     Social History:   reports that he has been smoking cigars. He has been exposed to tobacco smoke. He has never used smokeless tobacco. He reports that he does not drink alcohol and does not use drugs.  Physical Exam: BP 106/65   Pulse 79   Ht 5' 7"$  (1.702 m)   Wt 189 lb 4 oz (85.8 kg)   BMI 29.64 kg/m   Constitutional:  Alert and oriented, no acute distress, nontoxic appearing HEENT: [place], AT Cardiovascular: No clubbing, cyanosis, or edema Respiratory: Normal respiratory effort, no increased work of breathing Skin: No rashes, bruises or suspicious lesions Neurologic: Grossly intact, no focal deficits, moving all 4 extremities Psychiatric: Normal mood and affect  Laboratory Data: Results for orders placed or performed in visit on 02/20/22  Bladder Scan (Post Void Residual) in office  Result Value Ref Range   Scan Result 0 ml    Assessment & Plan:   1. OAB (overactive bladder) Symptoms improved on Myrbetriq 50 mg daily, will plan to continue this.  We discussed that symptoms may vary from day-to-day and this is normal.  We also discussed the role of his SGLT2 inhibitor and his OAB symptoms but I was very clear that he should not discontinue this medication.  Will plan to see him back next year for annual follow-up. - Bladder Scan (Post Void Residual) in office - mirabegron ER (MYRBETRIQ) 50 MG TB24 tablet; Take 1 tablet (50 mg total) by mouth daily.  Dispense: 90 tablet; Refill: 3   Return in about 1 year (around 02/21/2023) for Annual OAB f/u with  PVR.  Debroah Loop, PA-C  [facility] [address] [place],  [address] [phone]

## 2022-02-23 ENCOUNTER — Telehealth: Payer: Self-pay

## 2022-02-23 NOTE — Progress Notes (Unsigned)
Patient ID: ANTAEUS Rodriguez, male   DOB: [birth year], 67 y.o.   MRN: [id number]  Care Management & Coordination Services Pharmacy Team  Reason for Encounter: General adherence update   Contacted patient for general health update and medication adherence call.  {US HC Outreach:28874}   What concerns do you have about your medications?  The patient {denies/reports:25180} side effects with their medications.   How often do you forget or accidentally miss a dose? {missed doses:25554}  Do you use a pillbox? {yes/no:20286}  Are you having any problems getting your medications from your pharmacy? {yes/no:20286}  Has the cost of your medications been a concern? {yes/no:20286} If yes, what medication and is patient assistance available or has it been applied for?  Since last visit with PharmD, {no/thefollowing:25210} interventions have been made.   The patient {has/has not:25209} had an ED visit since last contact.   The patient {denies/reports:25180} problems with their health.   Patient {denies/reports:25180} concerns or questions for ***, PharmD at this time.   Counseled patient on: {GENERALCOUNSELING:28686}   Chart Updates:  Recent office visits:  02/04/22 Denita Lung, MD - Patient presented for Type 2 diabetes with complication. No medication changes.  01/21/22 Denita Lung, MD - Patient presented for Type 2 diabetes with complication. Patient switched to [facility] 3. No medication changes.  01/06/22  Denita Lung, MD - Patient presented for routine medical examination at a health care facility and other concerns. Prescribed Soliqua.  Recent consult visits:  02/20/22 Debroah Loop, PA-C  ( Urology) - Patient presented for OAB. No medication changes Bladder scan done.  01/16/22 Debroah Loop, PA-C  ( Urology) - Patient presented for OAB. Increased Mirabegron to 50 mg.  12/09/21 Debroah Loop, PA-C  ( Urology) - Patient presented for  OAB.Prescribed Mirabegron. Stopped Ditropan.  11/21/21 Liliane Shi, PA-C (Cardiology) - Patient presented for Coronary artery disease involving native coronary artery of native heart without angina pectoris and other concerns. No medication changes.  11/19/21 Reather Littler, MD - Patient presented to [facility] for Full thickness rotator cuff tear and other concerns. Imaging of shoulder done. No medication changes.  11/12/21 Billey Co, MD  (Urology) - Patient presented for Urinary Frequency and other concerns. Prescribed Oxybutynin Chloride.   Hospital visits:  None in previous 6 months  Medications: Outpatient Encounter Medications as of 02/23/2022  Medication Sig Note   aspirin 81 MG EC tablet Take 1 tablet (81 mg total) by mouth daily.    atorvastatin (LIPITOR) 40 MG tablet Take 1 tablet (40 mg total) by mouth daily.    canagliflozin (INVOKANA) 300 MG TABS tablet Take 1 tablet (300 mg total) by mouth daily before breakfast. 01/06/2022: Out of medication x 4 days   clopidogrel (PLAVIX) 75 MG tablet Take 1 tablet (75 mg total) by mouth daily.    Continuous Blood Gluc Sensor (FREESTYLE LIBRE 3 SENSOR) MISC Place 1 sensor on the skin every 14 days. Use to check glucose continuously    enalapril (VASOTEC) 2.5 MG tablet Take 1 tablet (2.5 mg total) by mouth daily.    Insulin Glargine-Lixisenatide (SOLIQUA) 100-33 UNT-MCG/ML SOPN Inject 15 Units into the skin daily.    Insulin Pen Needle 32G X 4 MM MISC Use daily as directed    loratadine (CLARITIN) 10 MG tablet Take 10 mg by mouth daily.    metFORMIN (GLUCOPHAGE-XR) 750 MG 24 hr tablet Take 1 tablet (750 mg total) by mouth in the morning and at bedtime.  metoprolol succinate (TOPROL-XL) 25 MG 24 hr tablet Take 1 tablet (25 mg total) by mouth daily.    mirabegron ER (MYRBETRIQ) 50 MG TB24 tablet Take 1 tablet (50 mg total) by mouth daily.    Multiple Vitamin (MULTIVITAMIN) tablet Take 1 tablet by mouth daily.    nitroGLYCERIN  (NITROSTAT) 0.4 MG SL tablet Place 1 tablet (0.4 mg total) under the tongue every 5 (five) minutes x 3 doses as needed for chest pain.    No facility-administered encounter medications on file as of 02/23/2022.    Recent vitals BP Readings from Last 3 Encounters:  02/20/22 106/65  02/04/22 110/70  01/21/22 110/60   Pulse Readings from Last 3 Encounters:  02/20/22 79  02/04/22 70  01/21/22 76   Wt Readings from Last 3 Encounters:  02/20/22 189 lb 4 oz (85.8 kg)  02/04/22 194 lb 6.4 oz (88.2 kg)  01/21/22 196 lb 6.4 oz (89.1 kg)   BMI Readings from Last 3 Encounters:  02/20/22 29.64 kg/m  02/04/22 30.22 kg/m  01/21/22 30.53 kg/m    Recent lab results    Component Value Date/Time   NA 135 01/06/2022 1506   K 4.3 01/06/2022 1506   CL 98 01/06/2022 1506   CO2 23 01/06/2022 1506   GLUCOSE 253 (H) 01/06/2022 1506   GLUCOSE 148 (H) 10/02/2015 0913   BUN 18 01/06/2022 1506   CREATININE 0.97 01/06/2022 1506   CREATININE 0.84 10/02/2015 0913   CALCIUM 9.5 01/06/2022 1506    Lab Results  Component Value Date   CREATININE 0.97 01/06/2022   EGFR 86 01/06/2022   GFRNONAA 69 05/30/2019   GFRAA 80 05/30/2019   Lab Results  Component Value Date/Time   HGBA1C 9.2 (A) 01/06/2022 02:16 PM   HGBA1C 7.8 (A) 07/24/2021 01:57 PM   HGBA1C 7.4 (H) 07/10/2012 09:09 AM   MICROALBUR <5.0 01/06/2022 03:18 PM   MICROALBUR <5.0 07/24/2021 01:57 PM    Lab Results  Component Value Date   CHOL 155 01/06/2022   HDL 54 01/06/2022   LDLCALC 81 01/06/2022   TRIG 111 01/06/2022   CHOLHDL 2.9 01/06/2022    Care Gaps: Annual wellness visit 01/06/22 Lab Results  Component Value Date   HGBA1C 9.2 (A) 01/06/2022     Star Rating Drugs:  Atorvastatin 40 mg - Last filled 11/24/21 90 [facility] at [facility]  Invokana 300 mg - Last filled 11/24/21 90 DS at [facility] Metformin 750 mg - Last filled 01/12/22 90 DS at [place] Pharmacist Assistant [phone]

## 2022-02-25 ENCOUNTER — Other Ambulatory Visit (HOSPITAL_COMMUNITY): Payer: Self-pay

## 2022-02-25 ENCOUNTER — Other Ambulatory Visit: Payer: Self-pay

## 2022-03-06 ENCOUNTER — Encounter: Payer: Self-pay | Admitting: Dietician

## 2022-03-06 ENCOUNTER — Encounter: Payer: PPO | Attending: Family Medicine | Admitting: Dietician

## 2022-03-06 VITALS — Wt 195.0 lb

## 2022-03-06 DIAGNOSIS — Z6834 Body mass index (BMI) 34.0-34.9, adult: Secondary | ICD-10-CM | POA: Diagnosis not present

## 2022-03-06 DIAGNOSIS — Z7985 Long-term (current) use of injectable non-insulin antidiabetic drugs: Secondary | ICD-10-CM | POA: Insufficient documentation

## 2022-03-06 DIAGNOSIS — Z7984 Long term (current) use of oral hypoglycemic drugs: Secondary | ICD-10-CM | POA: Insufficient documentation

## 2022-03-06 DIAGNOSIS — Z713 Dietary counseling and surveillance: Secondary | ICD-10-CM | POA: Diagnosis not present

## 2022-03-06 DIAGNOSIS — E118 Type 2 diabetes mellitus with unspecified complications: Secondary | ICD-10-CM | POA: Insufficient documentation

## 2022-03-06 DIAGNOSIS — E1165 Type 2 diabetes mellitus with hyperglycemia: Secondary | ICD-10-CM

## 2022-03-06 NOTE — Progress Notes (Signed)
Diabetes Self-Management Education  Visit Type: First/Initial  Appt. Start Time: 0945 Appt. End Time: 1050  03/06/2022  [name], identified by name and date of birth, is a 67 y.o. male with a diagnosis of Diabetes: Type 2.   ASSESSMENT  History includes: type 2 diabetes, HTN, MI.  Labs noted: 01/06/22 A1c 9.2% Medications include: metformin, insulin glargine (15 units), canagliflozin.  Supplements: slippery elm   Assessed pt's Freestyle Libre continuous glucose monitor. Time in range x30 days 91%. Walked through some app features with pt.   Pt states sometimes his blood glucose goes low at night. Pt reports this happens about 3-4 nights per week. Pt states he will drink some soda and then it does not come up so he eats candy and drinks more soda, then it spikes to 200.   Pt states he has been limiting his fried foods. He states he will eat fried chicken 1x/month and a small french fry 1-2x/month.    Pt walks 1-2x/day. Pt walks for at least an hour daily but occasionally will go on another walk after dinner for around 30 minutes.   Pt is a Social worker. Pt enjoys hanging out at his friends' car shop during the day.   Pt usually eats out for lunch or dinner and will eat food from home for the other meal. Common meals include some sort of protein (grilled chicken or kofta), hummus or ful mudammas, with lettuce, cucumber, tomato, and pita.   Weight 195 lb (88.5 kg). Body mass index is 30.54 kg/m.   Diabetes Self-Management Education - 03/06/22 0944       Visit Information   Visit Type First/Initial      Initial Visit   Diabetes Type Type 2    Date Diagnosed 2006    Are you currently following a meal plan? No    Are you taking your medications as prescribed? Yes      Health Coping   How would you rate your overall health? Good      Psychosocial Assessment   Patient Belief/Attitude about Diabetes Motivated to manage diabetes    What is the hardest part about  your diabetes right now, causing you the most concern, or is the most worrisome to you about your diabetes?   Making healty food and beverage choices    Self-care barriers None    Self-management support Doctor's office    Other persons present Patient    Patient Concerns Nutrition/Meal planning    Special Needs None    Preferred Learning Style No preference indicated    Learning Readiness Ready    How often do you need to have someone help you when you read instructions, pamphlets, or other written materials from your doctor or pharmacy? 1 - Never    What is the last grade level you completed in school? college      Pre-Education Assessment   Patient understands the diabetes disease and treatment process. Needs Instruction    Patient understands incorporating nutritional management into lifestyle. Needs Instruction    Patient undertands incorporating physical activity into lifestyle. Needs Instruction    Patient understands using medications safely. Needs Instruction    Patient understands monitoring blood glucose, interpreting and using results Needs Instruction    Patient understands prevention, detection, and treatment of acute complications. Needs Instruction    Patient understands prevention, detection, and treatment of chronic complications. Needs Instruction    Patient understands how to develop strategies to address psychosocial issues. Needs Instruction  Patient understands how to develop strategies to promote health/change behavior. Needs Instruction      Complications   Last HgB A1C per patient/outside source 9.2 %    How often do you check your blood sugar? > 4 times/day    Fasting Blood glucose range (mg/dL) 70-129    Number of hypoglycemic episodes per month 12    Have you had a dilated eye exam in the past 12 months? Yes    Have you had a dental exam in the past 12 months? No    Are you checking your feet? Yes    How many days per week are you checking your feet? 7       Dietary Intake   Breakfast skips    Snack (morning) none    Lunch 1pm: [employer] or [place] bread: kofta, hummus, salad, 1/2 pita bread.    Snack (afternoon) none    Dinner 6pm: salad with falafel sandwich on pita or grilled chicken OR ful mdames with 1/2 pita and vegetables    Snack (evening) none    Beverage(s) 32 oz coffee (sips throughout day), water 64 oz      Activity / Exercise   Activity / Exercise Type Light (walking / raking leaves)    How many days per week do you exercise? 7    How many minutes per day do you exercise? 60    Total minutes per week of exercise 420      Patient Education   Previous Diabetes Education Yes (please comment)   2006   Disease Pathophysiology Definition of diabetes, type 1 and 2, and the diagnosis of diabetes;Factors that contribute to the development of diabetes;Explored patient's options for treatment of their diabetes    Healthy Eating Role of diet in the treatment of diabetes and the relationship between the three main macronutrients and blood glucose level;Food label reading, portion sizes and measuring food.;Plate Method;Reviewed blood glucose goals for pre and post meals and how to evaluate the patients' food intake on their blood glucose level.;Meal timing in regards to the patients' current diabetes medication.;Information on hints to eating out and maintain blood glucose control.;Meal options for control of blood glucose level and chronic complications.    Being Active Role of exercise on diabetes management, blood pressure control and cardiac health.;Helped patient identify appropriate exercises in relation to his/her diabetes, diabetes complications and other health issue.    Medications Taught/reviewed insulin/injectables, injection, site rotation, insulin/injectables storage and needle disposal.;Reviewed medication adjustment guidelines for hyperglycemia and sick days.    Monitoring Identified appropriate SMBG and/or A1C  goals.;Taught/evaluated CGM (comment);Daily foot exams;Yearly dilated eye exam    Acute complications Discussed and identified patients' prevention, symptoms, and treatment of hyperglycemia.    Chronic complications Relationship between chronic complications and blood glucose control;Assessed and discussed foot care and prevention of foot problems;Lipid levels, blood glucose control and heart disease;Identified and discussed with patient  current chronic complications;Retinopathy and reason for yearly dilated eye exams;Reviewed with patient heart disease, higher risk of, and prevention;Nephropathy, what it is, prevention of, the use of ACE, ARB's and early detection of through urine microalbumia.;Dental care    Diabetes Stress and Support Identified and addressed patients feelings and concerns about diabetes;Worked with patient to identify barriers to care and solutions;Role of stress on diabetes    Lifestyle and Health Coping Lifestyle issues that need to be addressed for better diabetes care      Individualized Goals (developed by patient)   Nutrition General  guidelines for healthy choices and portions discussed    Physical Activity Exercise 5-7 days per week;60 minutes per day    Medications take my medication as prescribed    Monitoring  Consistenly use CGM    Problem Solving Eating Pattern    Reducing Risk examine blood glucose patterns;do foot checks daily;treat hypoglycemia with 15 grams of carbs if blood glucose less than '70mg'$ /dL    Health Coping Ask for help with psychological, social, or emotional issues      Post-Education Assessment   Patient understands the diabetes disease and treatment process. Comprehends key points    Patient understands incorporating nutritional management into lifestyle. Comprehends key points    Patient undertands incorporating physical activity into lifestyle. Demonstrates understanding / competency    Patient understands using medications safely. Demonstrates  understanding / competency    Patient understands monitoring blood glucose, interpreting and using results Comprehends key points    Patient understands prevention, detection, and treatment of acute complications. Comprehends key points    Patient understands prevention, detection, and treatment of chronic complications. Comprehends key points    Patient understands how to develop strategies to address psychosocial issues. Comprehends key points    Patient understands how to develop strategies to promote health/change behavior. Comprehends key points      Outcomes   Expected Outcomes Demonstrated interest in learning. Expect positive outcomes    Future DMSE 3-4 months    Program Status Not Completed             Individualized Plan for Diabetes Self-Management Training:   Learning Objective:  Patient will have a greater understanding of diabetes self-management. Patient education plan is to attend individual and/or group sessions per assessed needs and concerns.   Plan:   Patient Instructions  Aim for 150 minutes of physical activity weekly. Goal: continue your daily walking.  Goal: aim to make 1/2 of your plate vegetables at least 2x/day.  Goal: start eating breakfast and include at least a protein and complex carbohydrate.   Aim to eat within 1-2 hours of waking up and every 3-5 hours following. Avoid skipping meals.   Expected Outcomes:  Demonstrated interest in learning. Expect positive outcomes  Education material provided: ADA - How to Thrive: A Guide for Your Journey with Diabetes  If problems or questions, patient to contact team via:  Phone  Future DSME appointment: 3-4 months

## 2022-03-06 NOTE — Patient Instructions (Signed)
Aim for 150 minutes of physical activity weekly. Goal: continue your daily walking.  Goal: aim to make 1/2 of your plate vegetables at least 2x/day.  Goal: start eating breakfast and include at least a protein and complex carbohydrate.   Aim to eat within 1-2 hours of waking up and every 3-5 hours following. Avoid skipping meals.

## 2022-03-21 ENCOUNTER — Other Ambulatory Visit: Payer: Self-pay

## 2022-03-23 ENCOUNTER — Other Ambulatory Visit: Payer: Self-pay

## 2022-03-23 ENCOUNTER — Other Ambulatory Visit (HOSPITAL_COMMUNITY): Payer: Self-pay

## 2022-04-09 ENCOUNTER — Encounter: Payer: Self-pay | Admitting: Family Medicine

## 2022-04-09 ENCOUNTER — Ambulatory Visit (INDEPENDENT_AMBULATORY_CARE_PROVIDER_SITE_OTHER): Payer: PPO | Admitting: Family Medicine

## 2022-04-09 VITALS — BP 104/52 | HR 82 | Temp 98.6°F | Resp 16 | Wt 194.8 lb

## 2022-04-09 DIAGNOSIS — E785 Hyperlipidemia, unspecified: Secondary | ICD-10-CM

## 2022-04-09 DIAGNOSIS — E118 Type 2 diabetes mellitus with unspecified complications: Secondary | ICD-10-CM | POA: Diagnosis not present

## 2022-04-09 DIAGNOSIS — E1159 Type 2 diabetes mellitus with other circulatory complications: Secondary | ICD-10-CM | POA: Diagnosis not present

## 2022-04-09 DIAGNOSIS — I152 Hypertension secondary to endocrine disorders: Secondary | ICD-10-CM | POA: Diagnosis not present

## 2022-04-09 DIAGNOSIS — E1169 Type 2 diabetes mellitus with other specified complication: Secondary | ICD-10-CM

## 2022-04-09 DIAGNOSIS — E1121 Type 2 diabetes mellitus with diabetic nephropathy: Secondary | ICD-10-CM | POA: Diagnosis not present

## 2022-04-09 DIAGNOSIS — S99929A Unspecified injury of unspecified foot, initial encounter: Secondary | ICD-10-CM | POA: Diagnosis not present

## 2022-04-09 LAB — POCT GLYCOSYLATED HEMOGLOBIN (HGB A1C): Hemoglobin A1C: 6.7 % — AB (ref 4.0–5.6)

## 2022-04-09 NOTE — Progress Notes (Signed)
Subjective:    Patient ID: Jerome Rodriguez, male    DOB: 04/09/1955, 67 y.o.   MRN: [id number]  Jerome Rodriguez is a 67 y.o. male who presents for follow-up of Type 2 diabetes mellitus.  Home blood sugar records:  has a freestyle libre. Blood sugars averaging 140 Current symptoms/problems include none and have been stable. Daily foot checks: yes   Any foot concerns: yes on left foot How often blood sugars checked: has continuous glucose monitor Exercise: Home exercise routine includes walking. Diet: regular diet He is about to run out of Invokana and it would then cost him $90 a month which he says he cannot afford. Marland Kitchen  He also states that he is having difficulty with his left great toe.  He did try to trim the nail the other day. He is presently on Lipitor 40 mg and would like to have his lipid check. ROS as in subjective above.     Objective:    Physical Exam Alert and in no distress exam of the great toenail does show some recent trauma but no evidence of infection with swelling redness or edema..  The AGP report was evaluated and I reviewed it with him.  His numbers look quite good and his A1c indicator was 6.7 and his A1c in the office was the same.  Blood pressure (!) 104/52, pulse 82, temperature 98.6 F (37 C), temperature source Oral, resp. rate 16, weight 194 lb 12.8 oz (88.4 kg), SpO2 95 %.  Lab Review    Latest Ref Rng & Units 04/09/2022    1:42 PM 01/06/2022    3:18 PM 01/06/2022    3:06 PM 01/06/2022    2:16 PM 07/24/2021    1:57 PM  Diabetic Labs  HbA1c 4.0 - 5.6 % 6.7    9.2  7.8   Microalbumin mg/L  <5.0    <5.0   Micro/Creat Ratio   <9.1    <5.7   Chol 100 - 199 mg/dL   155     HDL >39 mg/dL   54     Calc LDL 0 - 99 mg/dL   81     Triglycerides 0 - 149 mg/dL   111     Creatinine 0.76 - 1.27 mg/dL   0.97         04/09/2022    1:26 PM 03/06/2022    9:48 AM 02/20/2022    9:18 AM 02/04/2022   10:17 AM 01/21/2022   10:07 AM  BP/Weight  Systolic BP 123456  A999333  A999333 A999333  Diastolic BP 52  65 70 60  Wt. (Lbs) 194.8 195 189.25 194.4 196.4  BMI 30.51 kg/m2 30.54 kg/m2 29.64 kg/m2 30.22 kg/m2 30.53 kg/m2      Latest Ref Rng & Units 08/04/2021   12:00 AM 07/24/2021   11:00 AM  Foot/eye exam completion dates  Eye Exam No Retinopathy No Retinopathy       Foot Form Completion   Done     This result is from an external source.    Jerome Rodriguez  reports that he has been smoking cigars. He has been exposed to tobacco smoke. He has never used smokeless tobacco. He reports that he does not drink alcohol and does not use drugs.     Assessment & Plan:    Hypertension associated with diabetes  Type 2 diabetes with complication - Plan: POCT glycosylated hemoglobin (Hb A1C)  Diabetic nephropathy with proteinuria  Hyperlipidemia associated with type 2 diabetes mellitus -  Plan: Lipid panel  Injury of great toenail  I congratulated him on the work that he is doing with his diet and exercise.  Discussed the fact that since he is going to be stopping the interval,, his blood sugars might start to go up and he will keep me informed concerning this. Also warned him about doing any trimming of his nails to be very very careful.  If the swelling gets worse he is to see his podiatrist.  He was comfortable with all this. Recheck 4 months

## 2022-04-09 NOTE — Patient Instructions (Signed)
When you run out of the [place] watch your blood sugars and if your blood sugars start to go up we will have to readjust your [facility]

## 2022-04-09 NOTE — Addendum Note (Signed)
Addended by: Denita Lung on: 04/09/2022 02:33 PM   Modules accepted: Level of Service

## 2022-04-10 LAB — LIPID PANEL
Chol/HDL Ratio: 2.3 ratio (ref 0.0–5.0)
Cholesterol, Total: 115 mg/dL (ref 100–199)
HDL: 49 mg/dL (ref >39)
LDL Chol Calc (NIH): 45 mg/dL (ref 0–99)
Triglycerides: 114 mg/dL (ref 0–149)
VLDL Cholesterol Cal: 21 mg/dL (ref 5–40)

## 2022-04-21 DIAGNOSIS — M792 Neuralgia and neuritis, unspecified: Secondary | ICD-10-CM | POA: Diagnosis not present

## 2022-04-21 DIAGNOSIS — B351 Tinea unguium: Secondary | ICD-10-CM | POA: Diagnosis not present

## 2022-04-21 DIAGNOSIS — E1151 Type 2 diabetes mellitus with diabetic peripheral angiopathy without gangrene: Secondary | ICD-10-CM | POA: Diagnosis not present

## 2022-04-27 ENCOUNTER — Other Ambulatory Visit: Payer: Self-pay

## 2022-04-28 ENCOUNTER — Telehealth: Payer: Self-pay

## 2022-04-28 NOTE — Progress Notes (Signed)
Patient ID: Jerome Rodriguez, male   DOB: [birth year], 67 y.o.   MRN: [id number]   Care Management & Coordination Services Pharmacy Team  Reason for Encounter: Diabetes  Contacted patient to discuss diabetes disease state. {US HC Outreach:28874}  Current antihyperglycemic regimen:  ***   Patient verbally confirms he is taking the above medications as directed. {yes/no:20286}  What diet changes have been made to improve diabetes control?  What recent interventions/DTPs have been made to improve glycemic control:  ***  Have there been any recent hospitalizations or ED visits since last visit with PharmD? {yes/no:20286}  Patient {reports/denies:24182} hypoglycemic symptoms, including {Hypoglycemic Symptoms:3049003}  Patient {reports/denies:24182} hyperglycemic symptoms, including {symptoms; hyperglycemia:17903}  How often are you checking your blood sugar? {BG Testing frequency:23922}  What are your blood sugars ranging?  Fasting: *** Before meals: *** After meals: *** Bedtime: ***  During the week, how often does your blood glucose drop below 70? {LowBGfrequency:24142}  Are you checking your feet daily/regularly? {yes/no:20286}  Adherence Review: Is the patient currently on a STATIN medication? {yes/no:20286} Is the patient currently on ACE/ARB medication? {yes/no:20286} Does the patient have >5 day gap between last estimated fill dates? {yes/no:20286}   Chart Updates:  Recent office visits:  04/09/22 Ronnald Nian, MD - Patient presented for Hypertension associated with diabetes and other concerns. No medication changes.   Recent consult visits:  03/06/22 El-Khouri, Philippa Sicks, RD (Nutrition) - Patient presented for type 2 diabetes mellitus with hyperglycemia without long term current use of insulin.  No medication changes.  Hospital visits:  None in previous 6 months  Medications: Outpatient Encounter Medications as of 04/28/2022  Medication Sig Note   aspirin  81 MG EC tablet Take 1 tablet (81 mg total) by mouth daily.    atorvastatin (LIPITOR) 40 MG tablet Take 1 tablet (40 mg total) by mouth daily.    canagliflozin (INVOKANA) 300 MG TABS tablet Take 1 tablet (300 mg total) by mouth daily before breakfast. 01/06/2022: Out of medication x 4 days   clopidogrel (PLAVIX) 75 MG tablet Take 1 tablet (75 mg total) by mouth daily.    Continuous Blood Gluc Sensor (FREESTYLE LIBRE 3 SENSOR) MISC Place 1 sensor on the skin every 14 days. Use to check glucose continuously    enalapril (VASOTEC) 2.5 MG tablet Take 1 tablet (2.5 mg total) by mouth daily.    Insulin Glargine-Lixisenatide (SOLIQUA) 100-33 UNT-MCG/ML SOPN Inject 15 Units into the skin daily.    Insulin Pen Needle 32G X 4 MM MISC Use daily as directed    loratadine (CLARITIN) 10 MG tablet Take 10 mg by mouth daily.    metFORMIN (GLUCOPHAGE-XR) 750 MG 24 hr tablet Take 1 tablet (750 mg total) by mouth in the morning and at bedtime.    metoprolol succinate (TOPROL-XL) 25 MG 24 hr tablet Take 1 tablet (25 mg total) by mouth daily.    mirabegron ER (MYRBETRIQ) 50 MG TB24 tablet Take 1 tablet (50 mg total) by mouth daily.    Multiple Vitamin (MULTIVITAMIN) tablet Take 1 tablet by mouth daily.    nitroGLYCERIN (NITROSTAT) 0.4 MG SL tablet Place 1 tablet (0.4 mg total) under the tongue every 5 (five) minutes x 3 doses as needed for chest pain.    No facility-administered encounter medications on file as of 04/28/2022.    Recent Relevant Labs: Lab Results  Component Value Date/Time   HGBA1C 6.7 (A) 04/09/2022 01:42 PM   HGBA1C 9.2 (A) 01/06/2022 02:16 PM   HGBA1C 7.4 (H)  07/10/2012 09:09 AM   MICROALBUR <5.0 01/06/2022 03:18 PM   MICROALBUR <5.0 07/24/2021 01:57 PM    Kidney Function Lab Results  Component Value Date/Time   CREATININE 0.97 01/06/2022 03:06 PM   CREATININE 1.01 07/24/2021 12:03 PM   CREATININE 0.84 10/02/2015 09:13 AM   CREATININE 0.97 07/30/2014 12:01 AM   GFRNONAA 69 05/30/2019  11:38 AM   GFRAA 80 05/30/2019 11:38 AM    Star Rating Drugs: *** Atorvastatin Lipitor 40 mg  - Last filled 02/25/22 90 DS at [facility]  Invokana 300 mg - Last filled 11/24/21 90 DS at [facility] Metformin 750 mg - Last filled 01/12/22 90 DS at [facility] Enalapril 2.5 mg - Last filled 03/13/22 90 DS at [facility] Gaps: AWV 01/06/22    Pamala Duffel CMA Clinical Pharmacist Assistant [phone]

## 2022-04-29 DIAGNOSIS — B351 Tinea unguium: Secondary | ICD-10-CM | POA: Diagnosis not present

## 2022-05-12 DIAGNOSIS — M792 Neuralgia and neuritis, unspecified: Secondary | ICD-10-CM | POA: Diagnosis not present

## 2022-05-12 DIAGNOSIS — I70203 Unspecified atherosclerosis of native arteries of extremities, bilateral legs: Secondary | ICD-10-CM | POA: Diagnosis not present

## 2022-05-12 DIAGNOSIS — B351 Tinea unguium: Secondary | ICD-10-CM | POA: Diagnosis not present

## 2022-05-12 DIAGNOSIS — E1151 Type 2 diabetes mellitus with diabetic peripheral angiopathy without gangrene: Secondary | ICD-10-CM | POA: Diagnosis not present

## 2022-05-26 NOTE — Progress Notes (Signed)
Care Management & Coordination Services Pharmacy Note  05/28/2022 Name:  Jerome Rodriguez MRN:  [id number] DOB:  Sep 04, 1955  Summary: BP at goal <130/80 A1C at goal <7 (officially out of Invokana), reporting sugars still within goal  Recommendations/Changes made from today's visit: -Counseled on importance of routine BP monitoring -Counseled to notify us at the office if sugars begin to rise now that he is out of Invokana  Follow up plan: DM call in Sept Pharmacist visit in Dec   Subjective: Jerome Rodriguez is an 67 y.o. year old male who is a primary patient of Ronnald Nian, MD.  The care coordination team was consulted for assistance with disease management and care coordination needs.    Engaged with patient by telephone for follow up visit.  Recent office visits: 04/09/22 Ronnald Nian, MD - Patient presented for Hypertension associated with diabetes and other concerns. No medication changes   02/04/22 Ronnald Nian, MD - Patient presented for Type 2 diabetes with complication. No medication changes.   01/21/22 Ronnald Nian, MD - Patient presented for Type 2 diabetes with complication. Patient switched to [facility] 3. No medication changes.   01/06/22  Ronnald Nian, MD - Patient presented for routine medical examination at a health care facility and other concerns. Prescribed Lawana Chambers  Recent consult visits: 03/06/22 El-Khouri, Philippa Sicks, RD (Nutrition) - Patient presented for type 2 diabetes mellitus with hyperglycemia without long term current use of insulin.  No medication changes   02/20/22 Carman Ching, PA-C  ( Urology) - Patient presented for OAB. No medication changes Bladder scan done.   01/16/22 Carman Ching, PA-C  ( Urology) - Patient presented for OAB. Increased Mirabegron to 50 mg.   12/09/21 Carman Ching, PA-C  ( Urology) - Patient presented for OAB.Prescribed Mirabegron. Stopped Ditropan.   11/21/21 Beatrice Lecher, PA-C (Cardiology) - Patient presented for Coronary artery disease involving native coronary artery of native heart without angina pectoris and other concerns. No medication changes.   11/19/21 Florentina Addison, MD - Patient presented to [facility] for Full thickness rotator cuff tear and other concerns. Imaging of shoulder done. No medication changes.   11/12/21 Sondra Come, MD  (Urology) - Patient presented for Urinary Frequency and other concerns. Prescribed Oxybutynin Chloride.   Hospital visits: None in previous 6 months   Objective:  Lab Results  Component Value Date   CREATININE 0.97 01/06/2022   BUN 18 01/06/2022   EGFR 86 01/06/2022   GFRNONAA 69 05/30/2019   GFRAA 80 05/30/2019   NA 135 01/06/2022   K 4.3 01/06/2022   CALCIUM 9.5 01/06/2022   CO2 23 01/06/2022   GLUCOSE 253 (H) 01/06/2022    Lab Results  Component Value Date/Time   HGBA1C 6.7 (A) 04/09/2022 01:42 PM   HGBA1C 9.2 (A) 01/06/2022 02:16 PM   HGBA1C 7.4 (H) 07/10/2012 09:09 AM   MICROALBUR <5.0 01/06/2022 03:18 PM   MICROALBUR <5.0 07/24/2021 01:57 PM    Last diabetic Eye exam:  Lab Results  Component Value Date/Time   HMDIABEYEEXA No Retinopathy 08/04/2021 12:00 AM    Last diabetic Foot exam: No results found for: "HMDIABFOOTEX"   Lab Results  Component Value Date   CHOL 115 04/09/2022   HDL 49 04/09/2022   LDLCALC 45 04/09/2022   TRIG 114 04/09/2022   CHOLHDL 2.3 04/09/2022       Latest Ref Rng & Units 01/06/2022    3:06 PM 07/24/2021   12:03  PM 04/02/2021   12:21 PM  Hepatic Function  Total Protein 6.0 - 8.5 g/dL 6.7  6.4  6.6   Albumin 3.9 - 4.9 g/dL 4.6  4.3  4.4   AST 0 - 40 IU/L 14  12  16    ALT 0 - 44 IU/L 16  11  13    Alk Phosphatase 44 - 121 IU/L 156  138  144   Total Bilirubin 0.0 - 1.2 mg/dL 0.5  0.6  0.6     Lab Results  Component Value Date/Time   TSH 7.079 (H) 07/12/2012 09:20 AM   TSH 10.120 (H) 07/10/2012 09:09 AM   FREET4 1.17 07/12/2012 09:20  AM       Latest Ref Rng & Units 01/06/2022    3:06 PM 04/02/2021   12:21 PM 06/04/2020    9:40 AM  CBC  WBC 3.4 - 10.8 x10E3/uL 7.5  11.3  8.6   Hemoglobin 13.0 - 17.7 g/dL 16.1  09.6  04.5   Hematocrit 37.5 - 51.0 % 46.5  50.1  48.0   Platelets 150 - 450 x10E3/uL 258  233  249     Lab Results  Component Value Date/Time   VD25OH 46 07/16/2011 10:58 AM    Clinical ASCVD: Yes  The ASCVD Risk score (Arnett DK, et al., 2019) failed to calculate for the following reasons:   The valid total cholesterol range is 130 to 320 mg/dL       4/0/9811    9:14 AM 01/06/2022    1:55 PM 03/21/2021    3:19 PM  Depression screen PHQ 2/9  Decreased Interest 0 0 0  Down, Depressed, Hopeless 0 0 0  PHQ - 2 Score 0 0 0     Social History   Tobacco Use  Smoking Status Light Smoker   Types: Cigars   Passive exposure: Current  Smokeless Tobacco Never  Tobacco Comments   smoke 1--d for 25 years; quit 6 months ago. is now smoking ciagrs occasionally    BP Readings from Last 3 Encounters:  04/09/22 (!) 104/52  02/20/22 106/65  02/04/22 110/70   Pulse Readings from Last 3 Encounters:  04/09/22 82  02/20/22 79  02/04/22 70   Wt Readings from Last 3 Encounters:  04/09/22 194 lb 12.8 oz (88.4 kg)  03/06/22 195 lb (88.5 kg)  02/20/22 189 lb 4 oz (85.8 kg)   BMI Readings from Last 3 Encounters:  04/09/22 30.51 kg/m  03/06/22 30.54 kg/m  02/20/22 29.64 kg/m    Allergies  Allergen Reactions   Lactose Intolerance (Gi)    Prasugrel Other (See Comments)    dizziness    Medications Reviewed Today     Reviewed by Benjiman Core, CMA (Certified Medical Assistant) on 04/09/22 at 1331  Med List Status: <None>   Medication Order Taking? Sig Documenting Provider Last Dose Status Informant  aspirin 81 MG EC tablet [id number] Yes Take 1 tablet (81 mg total) by mouth daily. Creig Hines, NP Taking Active Self  atorvastatin (LIPITOR) 40 MG tablet [id number] Yes Take 1 tablet (40  mg total) by mouth daily. Ronnald Nian, MD Taking Active   canagliflozin [facility]) 300 MG TABS tablet [id number] Yes Take 1 tablet (300 mg total) by mouth daily before breakfast. Ronnald Nian, MD Taking Active            Med Note [facility], Dorita Sciara   Tue Jan 06, 2022  1:54 PM) Out of medication x 4 days  clopidogrel (  PLAVIX) 75 MG tablet [id number] Yes Take 1 tablet (75 mg total) by mouth daily. Ronnald Nian, MD Taking Active   Continuous Blood Gluc Sensor (FREESTYLE LIBRE 3 SENSOR) [place] [id number] Yes Place 1 sensor on the skin every 14 days. Use to check glucose continuously Tysinger, Kermit Balo, PA-C Taking Active   enalapril (VASOTEC) 2.5 MG tablet [id number] Yes Take 1 tablet (2.5 mg total) by mouth daily. Ronnald Nian, MD Taking Active   Insulin Glargine-Lixisenatide [facility]) 100-33 UNT-MCG/ML SOPN [id number] Yes Inject 15 Units into the skin daily. Ronnald Nian, MD Taking Active   Insulin Pen Needle 32G X 4 MM MISC [id number] Yes Use daily as directed Ronnald Nian, MD Taking Active   loratadine (CLARITIN) 10 MG tablet [id number] Yes Take 10 mg by mouth daily. [provider] Taking Active Self  metFORMIN (GLUCOPHAGE-XR) 750 MG 24 hr tablet [id number] Yes Take 1 tablet (750 mg total) by mouth in the morning and at bedtime. Ronnald Nian, MD Taking Active   metoprolol succinate (TOPROL-XL) 25 MG 24 hr tablet [id number] Yes Take 1 tablet (25 mg total) by mouth daily. Ronnald Nian, MD Taking Active   mirabegron ER [facility]) 50 MG TB24 tablet [id number] Yes Take 1 tablet (50 mg total) by mouth daily. Carman Ching, PA-C Taking Active   Multiple Vitamin (MULTIVITAMIN) tablet [id number] Yes Take 1 tablet by mouth daily. [provider] Taking Active Self  nitroGLYCERIN (NITROSTAT) 0.4 MG SL tablet [id number] Yes Place 1 tablet (0.4 mg total) under the tongue every 5 (five) minutes x 3 doses as needed for chest pain. Tonny Bollman, MD Taking Active              SDOH:  (Social Determinants of Health) assessments and interventions performed: Yes SDOH Interventions    Flowsheet Row Care Coordination from 05/28/2022 in [facility]  SDOH Interventions   Food Insecurity Interventions Intervention Not Indicated  Housing Interventions Intervention Not Indicated       Medication Assistance: None needed at this time (Did not qualify for Invokana assistance)  Medication Access: Name and location of current pharmacy:  [facility] [facility] [address] [place] [address] Phone: [phone] Fax: [phone]  [facility] - [place], [place] - [address] [place] [address] Phone: [phone] Fax: [phone]  [facility] - [address] [place]), [place] - [address] [place] ([place]) [place] [address] Phone: [phone] Fax: [phone]  Within the past 30 days, how often has patient missed a dose of medication? None Is a pillbox or other method used to improve adherence? Yes  Factors that may affect medication adherence? no barriers identified Are meds synced by current pharmacy? No  Are meds delivered by current pharmacy? No  Does patient experience delays in picking up medications due to transportation concerns? No   Compliance/Adherence/Medication fill history: Care Gaps: None  Star-Rating Drugs: Atorvastatin Lipitor 40 mg  - Last filled 02/25/22 90 DS at [facility] 300 mg - Last filled 11/24/21 90 DS at [facility] ---has been stopped Metformin 750 mg - Last filled 01/12/22 90 DS at [facility] Enalapril 2.5 mg - Last filled 03/13/22 90 DS at [facility]   Assessment/Plan Hypertension (BP goal <130/80) -Controlled -Current treatment: Enalapril 2.5mg  1 qd Appropriate, Effective, Safe, Accessible Metoprolol XL 25mg  1 qd Appropriate, Effective, Safe, Accessible -Medications previously tried: None   -Current  home readings: checks at [facility] on a regular basis (almost every day) -Current dietary habits: mindful of salt intake -Current exercise habits: walks at least an hour/day -Denies hypotensive/hypertensive symptoms -Educated on BP goals and benefits of medications for prevention of heart attack, stroke and kidney damage; Daily salt intake goal < 2300 mg; Importance of home blood pressure monitoring; Proper BP monitoring technique; -Counseled to monitor BP at home weekly, document, and provide log at future appointments -Recommended to continue current medication  Diabetes (A1c goal <7%) -Controlled -Current medications: Invokana 300mg  1 qd --officially ran out of it on April 14th Metformin XR 750mg  1 BID Appropriate, Effective, Safe, Accessible [place] 15 units into skin daily in the morning Appropriate, Effective, Safe, Accessible -Medications previously tried: Theme park manager  -Current home glucose readings ---sensors fasting glucose: reports occasional lows in the 50s but they correct with sugary beverage post prandial glucose: while on the phone -218 but just ate ago -Denies hypoglycemic/hyperglycemic symptoms -Current meal patterns:  Not discussed in detail but mindful of his low-carb choices and water intake -Current exercise: see above -Educated on A1c and blood sugar goals; Complications of diabetes including kidney damage, retinal damage, and cardiovascular disease; Prevention and management of hypoglycemic episodes; Continuous glucose monitoring; -Counseled to check feet daily and get yearly eye exams -Recommended to continue current medication  Sherrill Raring Clinical Pharmacist [phone]

## 2022-05-27 ENCOUNTER — Telehealth: Payer: Self-pay

## 2022-05-27 ENCOUNTER — Other Ambulatory Visit (HOSPITAL_COMMUNITY): Payer: Self-pay

## 2022-05-27 NOTE — Progress Notes (Signed)
Patient ID: Jerome Rodriguez, male   DOB: 07/01/1955, 67 y.o.   MRN: [id number]   Care Management & Coordination Services Pharmacy Team  Reason for Encounter: Appointment Reminder  Contacted patient to confirm telephone appointment with Milas Kocher, PharmD on 05/28/22 at 2. Unsuccessful outreach. Left voicemail for patient to return call.    Star Rating Drugs:   Atorvastatin Lipitor 40 mg  - Last filled 02/25/22 90 DS at [facility] 300 mg - Last filled 11/24/21 90 DS at [facility] Metformin 750 mg - Last filled 04/27/22 90 DS at [facility] Enalapril 2.5 mg - Last filled 03/13/22 90 DS at [facility] Gaps: AWV 01/06/22   Pamala Duffel CMA Clinical Pharmacist Assistant [phone]

## 2022-05-28 ENCOUNTER — Ambulatory Visit: Payer: PPO

## 2022-05-28 ENCOUNTER — Other Ambulatory Visit (HOSPITAL_COMMUNITY): Payer: Self-pay

## 2022-06-21 ENCOUNTER — Other Ambulatory Visit: Payer: Self-pay | Admitting: Family Medicine

## 2022-06-21 DIAGNOSIS — I2510 Atherosclerotic heart disease of native coronary artery without angina pectoris: Secondary | ICD-10-CM

## 2022-06-21 DIAGNOSIS — E1159 Type 2 diabetes mellitus with other circulatory complications: Secondary | ICD-10-CM

## 2022-06-22 ENCOUNTER — Other Ambulatory Visit (HOSPITAL_COMMUNITY): Payer: Self-pay

## 2022-06-22 MED ORDER — METOPROLOL SUCCINATE ER 25 MG PO TB24
25.0000 mg | ORAL_TABLET | Freq: Every day | ORAL | 0 refills | Status: DC
  Filled 2022-06-22: qty 90, 90d supply, fill #0

## 2022-06-22 MED ORDER — ENALAPRIL MALEATE 2.5 MG PO TABS
2.5000 mg | ORAL_TABLET | Freq: Every day | ORAL | 0 refills | Status: DC
  Filled 2022-06-22: qty 90, 90d supply, fill #0

## 2022-07-07 ENCOUNTER — Other Ambulatory Visit (HOSPITAL_COMMUNITY): Payer: Self-pay

## 2022-07-07 ENCOUNTER — Other Ambulatory Visit: Payer: Self-pay | Admitting: Medical

## 2022-07-07 MED ORDER — FREESTYLE LIBRE 3 SENSOR MISC
1 refills | Status: DC
  Filled 2022-07-07: qty 6, 84d supply, fill #0
  Filled 2022-09-11: qty 6, 84d supply, fill #1

## 2022-07-17 ENCOUNTER — Encounter: Payer: Self-pay | Admitting: Dietician

## 2022-07-17 ENCOUNTER — Encounter: Payer: PPO | Attending: Family Medicine | Admitting: Dietician

## 2022-07-17 VITALS — Wt 198.0 lb

## 2022-07-17 DIAGNOSIS — Z794 Long term (current) use of insulin: Secondary | ICD-10-CM | POA: Insufficient documentation

## 2022-07-17 DIAGNOSIS — E119 Type 2 diabetes mellitus without complications: Secondary | ICD-10-CM | POA: Diagnosis not present

## 2022-07-17 NOTE — Progress Notes (Signed)
Diabetes Self-Management Education  Visit Type: Follow-up  Appt. Start Time: 0940 Appt. End Time: 1015  07/17/2022  Jerome Rodriguez, identified by name and date of birth, is a 67 y.o. male with a diagnosis of Diabetes: type 2 .   ASSESSMENT  History includes: type 2 diabetes, HTN, MI.  Labs noted: A1c 04/09/22: 6.7%, down from 01/06/22 A1c 9.2% Medications include: metformin, insulin glargine (15 units), canagliflozin.  Supplements: slippery elm Weight History: 07/17/22: 198 lbs  03/06/22: 195 lbs  Pt states he started eating in the morning when he takes his insulin. He states he has been having something simple for breakfast.   Pt states his blood sugar dropped to 50 last night so he drank juice, had chocoalte, and cookies and waited 5 min but it didn't come up yet so he states he had more, then later it spiked to over 200. Discussed proper hypoglycemia treatment.   Assessed CGM. Time in range x3 months 88%.   Pt brought his A1c down from 9.2% in January to 6.7% in April.  Pt states he still walks 1.5 hours daily. He states sometimes his blood sugar drops during his walk so he brings a milky way.   Assessment of Previous Goals:  Aim for 150 minutes of physical activity weekly. Goal: continue your daily walking. - goal met, continue.   Goal: aim to make 1/2 of your plate vegetables at least 2x/day. - goal in progress, continue.   Goal: start eating breakfast and include at least a protein and complex carbohydrate. - goal met, continue   Aim to eat within 1-2 hours of waking up and every 3-5 hours following. Avoid skipping meals. - goal in progress, continue.  Weight 198 lb (89.8 kg). Body mass index is 31.01 kg/m.   Diabetes Self-Management Education - 07/17/22 0935       Visit Information   Visit Type Follow-up      Health Coping   How would you rate your overall health? Good      Psychosocial Assessment   Patient Belief/Attitude about Diabetes Motivated to  manage diabetes    What is the hardest part about your diabetes right now, causing you the most concern, or is the most worrisome to you about your diabetes?   Making healty food and beverage choices    Self-care barriers None    Self-management support Doctor's office    Other persons present Patient    Patient Concerns Healthy Lifestyle    Special Needs None    Preferred Learning Style No preference indicated    Learning Readiness Ready    How often do you need to have someone help you when you read instructions, pamphlets, or other written materials from your doctor or pharmacy? 1 - Never      Pre-Education Assessment   Patient understands the diabetes disease and treatment process. Needs Review    Patient understands incorporating nutritional management into lifestyle. Needs Review    Patient undertands incorporating physical activity into lifestyle. Needs Review    Patient understands using medications safely. Needs Review    Patient understands monitoring blood glucose, interpreting and using results Needs Review    Patient understands prevention, detection, and treatment of acute complications. Needs Review    Patient understands prevention, detection, and treatment of chronic complications. Needs Review    Patient understands how to develop strategies to address psychosocial issues. Needs Review    Patient understands how to develop strategies to promote health/change behavior. Needs Review  Complications   Last HgB A1C per patient/outside source 6.7 %    How often do you check your blood sugar? > 4 times/day      Dietary Intake   Breakfast cornflakes OR leftovers    Snack (morning) none    Lunch steak and cheese sub with salad (no bread)    Snack (afternoon) cookie    Dinner egg with chicken sausage and onion and salad    Snack (evening) peanuts    Beverage(s) coffee, and water      Activity / Exercise   Activity / Exercise Type Light (walking / raking leaves)    How  many days per week do you exercise? 5    How many minutes per day do you exercise? 90    Total minutes per week of exercise 450      Patient Education   Previous Diabetes Education Yes (please comment)    Disease Pathophysiology Explored patient's options for treatment of their diabetes    Healthy Eating Role of diet in the treatment of diabetes and the relationship between the three main macronutrients and blood glucose level;Plate Method;Meal timing in regards to the patients' current diabetes medication.;Information on hints to eating out and maintain blood glucose control.;Meal options for control of blood glucose level and chronic complications.;Reviewed blood glucose goals for pre and post meals and how to evaluate the patients' food intake on their blood glucose level.    Being Active Role of exercise on diabetes management, blood pressure control and cardiac health.;Helped patient identify appropriate exercises in relation to his/her diabetes, diabetes complications and other health issue.    Medications Reviewed patients medication for diabetes, action, purpose, timing of dose and side effects.    Monitoring Taught/evaluated CGM (comment)    Acute complications Taught prevention, symptoms, and  treatment of hypoglycemia - the 15 rule.    Chronic complications Relationship between chronic complications and blood glucose control;Identified and discussed with patient  current chronic complications;Lipid levels, blood glucose control and heart disease    Diabetes Stress and Support Identified and addressed patients feelings and concerns about diabetes    Lifestyle and Health Coping Lifestyle issues that need to be addressed for better diabetes care      Individualized Goals (developed by patient)   Nutrition General guidelines for healthy choices and portions discussed    Physical Activity Exercise 5-7 days per week;60 minutes per day    Medications take my medication as prescribed     Monitoring  Consistenly use CGM    Problem Solving Eating Pattern    Reducing Risk examine blood glucose patterns;do foot checks daily;treat hypoglycemia with 15 grams of carbs if blood glucose less than 70mg /dL    Health Coping Ask for help with psychological, social, or emotional issues      Patient Self-Evaluation of Goals - Patient rates self as meeting previously set goals (% of time)   Nutrition 50 - 75 % (half of the time)    Physical Activity >75% (most of the time)    Medications >75% (most of the time)    Monitoring >75% (most of the time)    Problem Solving and behavior change strategies  >75% (most of the time)    Reducing Risk (treating acute and chronic complications) >75% (most of the time)    Health Coping >75% (most of the time)      Post-Education Assessment   Patient understands the diabetes disease and treatment process. Comprehends key points  Patient understands incorporating nutritional management into lifestyle. Comprehends key points    Patient undertands incorporating physical activity into lifestyle. Demonstrates understanding / competency    Patient understands using medications safely. Demonstrates understanding / competency    Patient understands monitoring blood glucose, interpreting and using results Demonstrates understanding / competency    Patient understands prevention, detection, and treatment of acute complications. Comprehends key points    Patient understands prevention, detection, and treatment of chronic complications. Comprehends key points    Patient understands how to develop strategies to address psychosocial issues. Comprehends key points    Patient understands how to develop strategies to promote health/change behavior. Comprehends key points      Outcomes   Expected Outcomes Demonstrated interest in learning. Expect positive outcomes    Future DMSE 3-4 months    Program Status Not Completed      Subsequent Visit   Since your last visit  have you continued or begun to take your medications as prescribed? Yes             Individualized Plan for Diabetes Self-Management Training:   Learning Objective:  Patient will have a greater understanding of diabetes self-management. Patient education plan is to attend individual and/or group sessions per assessed needs and concerns.   Plan:   Patient Instructions  Aim for 150 minutes of physical activity weekly. Goal: continue your daily walking.   Goal: aim to make 1/2 of your plate vegetables at least 2x/day.   Goal: start eating breakfast and include at least a protein and complex carbohydrate.    Aim to eat within 1-2 hours of waking up and every 3-5 hours following. Avoid skipping meals.   Expected Outcomes:  Demonstrated interest in learning. Expect positive outcomes  Education material provided: My Plate  If problems or questions, patient to contact team via:  Phone  Future DSME appointment: 3-4 months

## 2022-07-17 NOTE — Patient Instructions (Signed)
Aim for 150 minutes of physical activity weekly. Goal: continue your daily walking.  Goal: aim to make 1/2 of your plate vegetables at least 2x/day.  Goal: start eating breakfast and include at least a protein and complex carbohydrate.   Aim to eat within 1-2 hours of waking up and every 3-5 hours following. Avoid skipping meals.  

## 2022-07-20 ENCOUNTER — Other Ambulatory Visit (HOSPITAL_COMMUNITY): Payer: Self-pay

## 2022-08-04 ENCOUNTER — Other Ambulatory Visit (HOSPITAL_COMMUNITY): Payer: Self-pay

## 2022-08-04 ENCOUNTER — Other Ambulatory Visit: Payer: Self-pay | Admitting: Family Medicine

## 2022-08-04 DIAGNOSIS — E118 Type 2 diabetes mellitus with unspecified complications: Secondary | ICD-10-CM

## 2022-08-04 MED ORDER — METFORMIN HCL ER 750 MG PO TB24
750.0000 mg | ORAL_TABLET | Freq: Two times a day (BID) | ORAL | 0 refills | Status: DC
  Filled 2022-08-04: qty 180, 90d supply, fill #0

## 2022-08-10 DIAGNOSIS — E119 Type 2 diabetes mellitus without complications: Secondary | ICD-10-CM | POA: Diagnosis not present

## 2022-08-10 DIAGNOSIS — H40013 Open angle with borderline findings, low risk, bilateral: Secondary | ICD-10-CM | POA: Diagnosis not present

## 2022-08-13 ENCOUNTER — Ambulatory Visit: Payer: PPO | Admitting: Family Medicine

## 2022-08-24 ENCOUNTER — Other Ambulatory Visit (HOSPITAL_COMMUNITY): Payer: Self-pay

## 2022-08-25 ENCOUNTER — Ambulatory Visit (INDEPENDENT_AMBULATORY_CARE_PROVIDER_SITE_OTHER): Payer: PPO | Admitting: Family Medicine

## 2022-08-25 ENCOUNTER — Other Ambulatory Visit (HOSPITAL_COMMUNITY): Payer: Self-pay

## 2022-08-25 ENCOUNTER — Encounter: Payer: Self-pay | Admitting: Family Medicine

## 2022-08-25 VITALS — BP 100/52 | HR 71 | Wt 200.6 lb

## 2022-08-25 DIAGNOSIS — E1169 Type 2 diabetes mellitus with other specified complication: Secondary | ICD-10-CM

## 2022-08-25 DIAGNOSIS — I152 Hypertension secondary to endocrine disorders: Secondary | ICD-10-CM

## 2022-08-25 DIAGNOSIS — E785 Hyperlipidemia, unspecified: Secondary | ICD-10-CM | POA: Diagnosis not present

## 2022-08-25 DIAGNOSIS — E118 Type 2 diabetes mellitus with unspecified complications: Secondary | ICD-10-CM

## 2022-08-25 DIAGNOSIS — N3281 Overactive bladder: Secondary | ICD-10-CM | POA: Diagnosis not present

## 2022-08-25 DIAGNOSIS — E669 Obesity, unspecified: Secondary | ICD-10-CM

## 2022-08-25 DIAGNOSIS — E1159 Type 2 diabetes mellitus with other circulatory complications: Secondary | ICD-10-CM

## 2022-08-25 DIAGNOSIS — Z720 Tobacco use: Secondary | ICD-10-CM

## 2022-08-25 DIAGNOSIS — I2510 Atherosclerotic heart disease of native coronary artery without angina pectoris: Secondary | ICD-10-CM | POA: Diagnosis not present

## 2022-08-25 DIAGNOSIS — H401131 Primary open-angle glaucoma, bilateral, mild stage: Secondary | ICD-10-CM

## 2022-08-25 DIAGNOSIS — E1121 Type 2 diabetes mellitus with diabetic nephropathy: Secondary | ICD-10-CM | POA: Diagnosis not present

## 2022-08-25 LAB — POCT GLYCOSYLATED HEMOGLOBIN (HGB A1C): Hemoglobin A1C: 6.7 % — AB (ref 4.0–5.6)

## 2022-08-25 NOTE — Progress Notes (Signed)
Subjective:    Patient ID: Jerome Rodriguez, male    DOB: 09/20/1955, 67 y.o.   MRN: [id number]  Jerome Rodriguez is a 67 y.o. male who presents for follow-up of Type 2 diabetes mellitus.  Home blood sugar records:  has CMG Current symptoms/problems include paresthesia of the feet (left foot) and have been stable. Daily foot checks: yes   Any foot concerns: no How often blood sugars checked: Has CGM Exercise: Home exercise routine includes walking 1.5 hours daily. Diet: regular diet.  He has also been working with the dietitian and making changes based on what he is seeing with his CGM.  He is not sure whether the Myrbetriq is really helping with his bladder issues but has made some dietary changes which he thinks has helped.  He continues on metformin and is also taking Soliqua 15 units.  Continues on enalapril as well as metoprolol.  Lipitor is causing no difficulty.  The following portions of the patient's history were reviewed and updated as appropriate: allergies, current medications, past medical history, past social history and problem list.  ROS as in subjective above.     Objective:    Physical Exam Alert and in no distress otherwise not examined. The AGP report concerning his blood sugar readings was reviewed with him.  He seems to be doing a good job of adjusting based on what the blood sugars show.  He has definitely made an effort in cutting back on carbohydrates and is happy the results show that. Blood pressure (!) 100/52, pulse 71, weight 200 lb 9.6 oz (91 kg), SpO2 98%.  Lab Review    Latest Ref Rng & Units 04/09/2022    1:54 PM 04/09/2022    1:42 PM 01/06/2022    3:18 PM 01/06/2022    3:06 PM 01/06/2022    2:16 PM  Diabetic Labs  HbA1c 4.0 - 5.6 %  6.7    9.2   Microalbumin mg/L   <5.0     Micro/Creat Ratio    <9.1     Chol 100 - 199 mg/dL 782    956    HDL >21 mg/dL 49    54    Calc LDL 0 - 99 mg/dL 45    81    Triglycerides 0 - 149 mg/dL 308    657     Creatinine 0.76 - 1.27 mg/dL    8.46        9/62/9528   10:48 AM 07/17/2022   10:29 AM 04/09/2022    1:26 PM 03/06/2022    9:48 AM 02/20/2022    9:18 AM  BP/Weight  Systolic BP 100  413  106  Diastolic BP 52  52  65  Wt. (Lbs) 200.6 198 194.8 195 189.25  BMI 31.42 kg/m2 31.01 kg/m2 30.51 kg/m2 30.54 kg/m2 29.64 kg/m2      Latest Ref Rng & Units 08/04/2021   12:00 AM 07/24/2021   11:00 AM  Foot/eye exam completion dates  Eye Exam No Retinopathy No Retinopathy       Foot Form Completion   Done     This result is from an external source.    Kinta  reports that he has been smoking cigars. He has been exposed to tobacco smoke. He has never used smokeless tobacco. He reports that he does not drink alcohol and does not use drugs.     Assessment & Plan:    Coronary artery disease involving native coronary artery of native heart  without angina pectoris  Diabetic nephropathy with proteinuria (HCC)  Hyperlipidemia associated with type 2 diabetes mellitus (HCC)  Hypertension associated with diabetes (HCC)  OAB (overactive bladder)  Obesity (BMI 30-39.9)  Primary open angle glaucoma (POAG) of both eyes, mild stage  Tobacco abuse  Type 2 diabetes with complication (HCC) - Plan: POCT glycosylated hemoglobin (Hb A1C)  I congratulated him on the work that he is doing with his blood sugar.  Reinforced the need to place his monitor in the proper spot on his arm as he did have it in the wrong.

## 2022-09-08 ENCOUNTER — Telehealth: Payer: Self-pay | Admitting: Family Medicine

## 2022-09-08 NOTE — Telephone Encounter (Signed)
Pt called & states he has been itching around his Navel/ the areas where he injects his Soliqua for over a month, not itching anywhere else on his body.  Asked if there was a rash, he states little red dots.  Asked if had changed laundry soaps or any new products said no.  He has not tried anything for the itching.  He does not inject in thighs or anywhere else.  Please advise what you suggest.

## 2022-09-08 NOTE — Telephone Encounter (Signed)
Patient advised.

## 2022-09-11 ENCOUNTER — Other Ambulatory Visit (HOSPITAL_COMMUNITY): Payer: Self-pay

## 2022-09-14 ENCOUNTER — Other Ambulatory Visit (HOSPITAL_COMMUNITY): Payer: Self-pay

## 2022-09-14 ENCOUNTER — Other Ambulatory Visit: Payer: Self-pay

## 2022-09-14 ENCOUNTER — Other Ambulatory Visit: Payer: Self-pay | Admitting: Family Medicine

## 2022-09-14 DIAGNOSIS — I2510 Atherosclerotic heart disease of native coronary artery without angina pectoris: Secondary | ICD-10-CM

## 2022-09-14 DIAGNOSIS — E1159 Type 2 diabetes mellitus with other circulatory complications: Secondary | ICD-10-CM

## 2022-09-14 MED ORDER — METOPROLOL SUCCINATE ER 25 MG PO TB24
25.0000 mg | ORAL_TABLET | Freq: Every day | ORAL | 1 refills | Status: DC
  Filled 2022-09-14: qty 90, 90d supply, fill #0
  Filled 2022-12-20: qty 90, 90d supply, fill #1

## 2022-09-14 MED ORDER — ENALAPRIL MALEATE 2.5 MG PO TABS
2.5000 mg | ORAL_TABLET | Freq: Every day | ORAL | 1 refills | Status: DC
Start: 2022-09-14 — End: 2023-03-16
  Filled 2022-09-14: qty 90, 90d supply, fill #0
  Filled 2022-12-20: qty 20, 20d supply, fill #1
  Filled 2022-12-21: qty 70, 70d supply, fill #1

## 2022-09-20 NOTE — Telephone Encounter (Signed)
done

## 2022-10-01 ENCOUNTER — Telehealth: Payer: Self-pay | Admitting: Physician Assistant

## 2022-10-01 ENCOUNTER — Other Ambulatory Visit (HOSPITAL_COMMUNITY): Payer: Self-pay

## 2022-10-01 NOTE — Telephone Encounter (Signed)
Pt called to see if he could get a couple months samples of Myrbetriq 50 mg.  He is in the donut hole w/insurance and his meds were going up from $105 for 3 months, to 1 month supply for $110, He did get 1 month worth, but said he can't afford to get for November and December.

## 2022-10-02 NOTE — Telephone Encounter (Signed)
Patient advised.

## 2022-10-02 NOTE — Telephone Encounter (Signed)
Patient advised that we do not have samples of Myrbetriq anymore as it is generic now.  Patient states if Myrbetriq could be the cause of his itching-he is itching all over his belly, thighs, back. This developed about 2 to 3 months ago but he has been on the medication since December 2023. He did start taking Multivitamin about 2 to 3 months ago but nothing else new. No changes in soap or food. Wonders if it can be from [facility] at this point?  Not sure if it needs to wait for Sam to review. Will send to [facility] also

## 2022-10-15 ENCOUNTER — Encounter: Payer: Self-pay | Admitting: Family Medicine

## 2022-10-15 ENCOUNTER — Other Ambulatory Visit (HOSPITAL_COMMUNITY): Payer: Self-pay

## 2022-10-15 ENCOUNTER — Ambulatory Visit (INDEPENDENT_AMBULATORY_CARE_PROVIDER_SITE_OTHER): Payer: PPO | Admitting: Family Medicine

## 2022-10-15 VITALS — BP 102/58 | HR 70 | Temp 97.6°F | Wt 201.0 lb

## 2022-10-15 DIAGNOSIS — L739 Follicular disorder, unspecified: Secondary | ICD-10-CM | POA: Diagnosis not present

## 2022-10-15 DIAGNOSIS — Z23 Encounter for immunization: Secondary | ICD-10-CM

## 2022-10-15 MED ORDER — DOXYCYCLINE HYCLATE 100 MG PO TABS
100.0000 mg | ORAL_TABLET | Freq: Two times a day (BID) | ORAL | 0 refills | Status: AC
Start: 2022-10-15 — End: 2022-10-25
  Filled 2022-10-15: qty 20, 10d supply, fill #0

## 2022-10-15 NOTE — Progress Notes (Signed)
Subjective:    Patient ID: Jerome Rodriguez, male    DOB: [birth year], 67 y.o.   MRN: [id number]  HPI He is here for evaluation of a rash that he has on his abdomen that he has had since August.  He thinks it might be related to the [facility] but he uses the shots on his legs as well as his abdomen.   Review of Systems     Objective:    Physical Exam Exam of his abdomen does show scattered follicular erythematous lesions but none on his arms back or legs.       Assessment & Plan:   Problem List Items Addressed This Visit   None Visit Diagnoses     Folliculitis    -  Primary   Relevant Medications   doxycycline (VIBRA-TABS) 100 MG tablet   Need for COVID-19 vaccine       Relevant Orders   Pfizer Comirnaty Covid -19 Vaccine 26yrs and older     I explained that I thought this was folliculitis but unrelated to any of his medications.  He is to call me in 10 days to let me know how he is doing on the medication.  COVID shot also given.

## 2022-10-26 ENCOUNTER — Telehealth: Payer: Self-pay | Admitting: Family Medicine

## 2022-10-26 NOTE — Telephone Encounter (Signed)
Pt left message that you told him to let him know if the antibiotic didn't work & he said that it hasn't worked, he still having same issues, extreme itching, would like stronger antibiotic sent in

## 2022-10-27 ENCOUNTER — Other Ambulatory Visit: Payer: Self-pay | Admitting: Family Medicine

## 2022-10-27 ENCOUNTER — Other Ambulatory Visit: Payer: Self-pay

## 2022-10-27 ENCOUNTER — Other Ambulatory Visit (HOSPITAL_COMMUNITY): Payer: Self-pay

## 2022-10-27 DIAGNOSIS — E118 Type 2 diabetes mellitus with unspecified complications: Secondary | ICD-10-CM

## 2022-10-27 MED ORDER — SULFAMETHOXAZOLE-TRIMETHOPRIM 800-160 MG PO TABS
1.0000 | ORAL_TABLET | Freq: Two times a day (BID) | ORAL | 0 refills | Status: DC
Start: 2022-10-27 — End: 2022-11-10
  Filled 2022-10-27: qty 20, 10d supply, fill #0

## 2022-10-27 MED ORDER — MIRABEGRON ER 50 MG PO TB24
50.0000 mg | ORAL_TABLET | Freq: Every day | ORAL | 0 refills | Status: DC
Start: 2022-10-27 — End: 2022-10-27
  Filled 2022-10-27: qty 30, 30d supply, fill #0

## 2022-10-27 MED ORDER — METFORMIN HCL ER 750 MG PO TB24
750.0000 mg | ORAL_TABLET | Freq: Two times a day (BID) | ORAL | 0 refills | Status: DC
Start: 2022-10-27 — End: 2023-01-28
  Filled 2022-10-27: qty 180, 90d supply, fill #0

## 2022-10-27 NOTE — Telephone Encounter (Signed)
Called pt and informed

## 2022-10-27 NOTE — Telephone Encounter (Signed)
Called pt to inform of new medication, he wanted to know what could help with the itching, said its really bad.  Can pt take Benadryl?  Said his HTN has been controlled, but he wanted to make sure it was safe with his other meds?

## 2022-10-30 ENCOUNTER — Telehealth: Payer: Self-pay | Admitting: Family Medicine

## 2022-10-30 NOTE — Telephone Encounter (Signed)
Pt called back and wanted you to know the benadryl is helping

## 2022-11-10 ENCOUNTER — Encounter: Payer: Self-pay | Admitting: Family Medicine

## 2022-11-10 ENCOUNTER — Ambulatory Visit (INDEPENDENT_AMBULATORY_CARE_PROVIDER_SITE_OTHER): Payer: PPO | Admitting: Family Medicine

## 2022-11-10 VITALS — BP 110/58 | HR 68 | Ht 67.5 in | Wt 201.8 lb

## 2022-11-10 DIAGNOSIS — E118 Type 2 diabetes mellitus with unspecified complications: Secondary | ICD-10-CM

## 2022-11-10 DIAGNOSIS — K921 Melena: Secondary | ICD-10-CM

## 2022-11-10 LAB — HEMOCCULT GUIAC POC 1CARD (OFFICE)

## 2022-11-10 NOTE — Progress Notes (Signed)
   Subjective:    Patient ID: Jerome Rodriguez, male    DOB: 01/24/1955, 67 y.o.   MRN: [id number]  HPI He is here for evaluation of a rash that he thinks is in relation to his [place].  The rash occurs mainly on his abdomen but he also has a on his legs and is noted at another spots unrelated to where he was given the shots.  He has been using Claritin which does help that but also helps with his underlying allergies.  He also notes that over the last day he has noted some black stools.  He has had a previous Cologuard testing which was negative.  Has had no nausea, vomiting, abdominal pain. No use of Pepto-Bismol recently  Review of Systems     Objective:    Physical Exam Abdominal exam shows multiple healing follicular type lesions mainly on the central abdominal area. Stool was brown and guaiac negative      Assessment & Plan:  Type 2 diabetes with complication (HCC)  Black stools - Plan: POCT occult blood stool I explained that I am not really sure that his itching is coming from the shots since he is having pruritic areas unrelated to the shot.  He is to change where he gets the shot and go more laterally and monitor the situation and let me know. I then explained that although he had black stools, at this point I see no reason for intervention since he has had a negative colon cancer screening.  He will keep me informed concerning the black stools and any other symptoms he is having with that.  He comfortable with that.

## 2022-11-10 NOTE — Patient Instructions (Signed)
Call me in 1 month to let me know how changing the sites work

## 2022-11-11 ENCOUNTER — Ambulatory Visit: Payer: PPO | Admitting: Family Medicine

## 2022-11-20 ENCOUNTER — Encounter: Payer: Self-pay | Admitting: Cardiovascular Disease

## 2022-11-20 ENCOUNTER — Ambulatory Visit: Payer: PPO | Attending: Cardiovascular Disease | Admitting: Cardiovascular Disease

## 2022-11-20 ENCOUNTER — Other Ambulatory Visit (HOSPITAL_COMMUNITY): Payer: Self-pay

## 2022-11-20 ENCOUNTER — Telehealth: Payer: Self-pay | Admitting: Family Medicine

## 2022-11-20 ENCOUNTER — Encounter: Payer: PPO | Attending: Family Medicine | Admitting: Dietician

## 2022-11-20 ENCOUNTER — Encounter: Payer: Self-pay | Admitting: Dietician

## 2022-11-20 VITALS — BP 124/60 | HR 81 | Resp 16 | Ht 67.0 in | Wt 200.6 lb

## 2022-11-20 VITALS — Wt 201.0 lb

## 2022-11-20 DIAGNOSIS — I152 Hypertension secondary to endocrine disorders: Secondary | ICD-10-CM

## 2022-11-20 DIAGNOSIS — E1169 Type 2 diabetes mellitus with other specified complication: Secondary | ICD-10-CM | POA: Diagnosis not present

## 2022-11-20 DIAGNOSIS — E1159 Type 2 diabetes mellitus with other circulatory complications: Secondary | ICD-10-CM

## 2022-11-20 DIAGNOSIS — I25119 Atherosclerotic heart disease of native coronary artery with unspecified angina pectoris: Secondary | ICD-10-CM

## 2022-11-20 DIAGNOSIS — E119 Type 2 diabetes mellitus without complications: Secondary | ICD-10-CM | POA: Diagnosis not present

## 2022-11-20 DIAGNOSIS — E785 Hyperlipidemia, unspecified: Secondary | ICD-10-CM | POA: Diagnosis not present

## 2022-11-20 DIAGNOSIS — Z794 Long term (current) use of insulin: Secondary | ICD-10-CM | POA: Insufficient documentation

## 2022-11-20 DIAGNOSIS — Z720 Tobacco use: Secondary | ICD-10-CM

## 2022-11-20 NOTE — Assessment & Plan Note (Signed)
Cessation counseling done 

## 2022-11-20 NOTE — Patient Instructions (Signed)
 Testing/Procedures: Exercise Myoview stress test Your physician has requested that you have en exercise stress myoview. For further information please visit [email or web address] Please follow instruction sheet, as given.  Follow-Up: At [facility], you and your health needs are our priority.  As part of our continuing mission to provide you with exceptional heart care, we have created designated Provider Care Teams.  These Care Teams include your primary Cardiologist (physician) and Advanced Practice Providers (APPs -  Physician Assistants and Nurse Practitioners) who all work together to provide you with the care you need, when you need it.  Your next appointment:   1 year(s)  Provider:   Tonny Bollman, MD       Other Instructions See separate sheet for stress test instructions

## 2022-11-20 NOTE — Assessment & Plan Note (Signed)
Lipids are excellent with a cholesterol of 115, LDL 45, HDL 49, triglycerides 114.  Continue current management.

## 2022-11-20 NOTE — Assessment & Plan Note (Signed)
The patient's EKG is reviewed and shows no ischemic changes.  He is having some atypical symptoms in the upper back and he is concerned about his heart.  He has not had any stress testing done in the past 10 years.  I recommended an exercise Myoview stress test.  He will continue his current medical management which includes aspirin and clopidogrel in the setting of a first generation DES, and ACE inhibitor in the setting of diabetes, a beta-blocker, and high intensity statin drug.

## 2022-11-20 NOTE — Progress Notes (Signed)
Cardiology Office Note:    Date:  11/20/2022   ID:  Jerome Rodriguez, DOB [birth year], MRN [id number]  PCP:  Ronnald Nian, MD   [facility] Providers Cardiologist:  Tonny Bollman, MD Cardiology APP:  Kennon Rounds     Referring MD: Ronnald Nian, MD   Chief Complaint  Patient presents with   Hyperlipidemia associated with type 2 diabetes mellitus    Coronary artery disease involving native coronary artery of   ASHD (arteriosclerotic heart disease)   Follow-up    1 year    History of Present Illness:    Jerome Rodriguez is a 67 y.o. male with a hx of  Coronary artery disease  NSTEMI s/p Taxus DES to LAD in 2004 Myoview in 2013: low risk  S/p 3 x 38 mm Promus DES to LAD 2/2 ISR in 2014 Cath 07/11/12 w residual CAD: LM < 10, mid LCx 20, distal LCx 30-40, proximal RCA 50-60, 30 at crux Long term DAPT Diabetes mellitus  Hypertension  Hyperlipidemia  Tobacco abuse [place] in 6/21: no AAA   Patient is here alone today.  He is doing pretty well.  He denies any chest pain or pressure, shortness of breath, edema, or heart palpitations.  He continues to smoke about 2 cigars/day.  He is not quite ready to quit.  We talked about modalities such as a nicotine patch to help him quit.  He has had some pain in the left upper back and has been concerned that this could be heart related.  It is nonexertional and feels like an ache.  It is nonradiating.  He is also had some numbness in both arms at times.  Current Medications: Current Meds  Medication Sig   aspirin 81 MG EC tablet Take 1 tablet (81 mg total) by mouth daily.   atorvastatin (LIPITOR) 40 MG tablet Take 1 tablet (40 mg total) by mouth daily.   clopidogrel (PLAVIX) 75 MG tablet Take 1 tablet (75 mg total) by mouth daily.   Continuous Glucose Sensor (FREESTYLE LIBRE 3 SENSOR) MISC Place 1 sensor on the skin every 14 days. Use to check glucose continuously   enalapril (VASOTEC) 2.5 MG tablet Take 1 tablet  (2.5 mg total) by mouth daily.   Insulin Glargine-Lixisenatide (SOLIQUA) 100-33 UNT-MCG/ML SOPN Inject 15 Units into the skin daily.   Insulin Pen Needle 32G X 4 MM MISC Use daily as directed   loratadine (CLARITIN) 10 MG tablet Take 10 mg by mouth daily.   metFORMIN (GLUCOPHAGE-XR) 750 MG 24 hr tablet Take 1 tablet (750 mg total) by mouth in the morning and at bedtime.   metoprolol succinate (TOPROL-XL) 25 MG 24 hr tablet Take 1 tablet (25 mg total) by mouth daily.   Multiple Vitamin (MULTIVITAMIN) tablet Take 1 tablet by mouth daily.   nitroGLYCERIN (NITROSTAT) 0.4 MG SL tablet Place 1 tablet (0.4 mg total) under the tongue every 5 (five) minutes x 3 doses as needed for chest pain.     Allergies:   Lactose intolerance (gi) and Prasugrel   ROS:   Please see the history of present illness.    All other systems reviewed and are negative.  EKGs/Labs/Other Studies Reviewed:    The following studies were reviewed today:     EKG:   EKG Interpretation Date/Time:  Friday November 20 2022 14:25:11 EST Ventricular Rate:  83 PR Interval:  168 QRS Duration:  82 QT Interval:  366 QTC Calculation: 430 R Axis:  8  Text Interpretation: Normal sinus rhythm Normal ECG When compared with ECG of 12-Jul-2012 06:05, No significant change was found Confirmed by Tonny Bollman [id number]) on 11/20/2022 2:30:21 PM    Recent Labs: 01/06/2022: ALT 16; BUN 18; Creatinine, Ser 0.97; Hemoglobin 15.7; Platelets 258; Potassium 4.3; Sodium 135  Recent Lipid Panel    Component Value Date/Time   CHOL 115 04/09/2022 1354   TRIG 114 04/09/2022 1354   HDL 49 04/09/2022 1354   CHOLHDL 2.3 04/09/2022 1354   CHOLHDL 4.7 10/02/2015 0913   VLDL 41 (H) 10/02/2015 0913   LDLCALC 45 04/09/2022 1354     Risk Assessment/Calculations:                Physical Exam:    VS:  BP 124/60 (BP Location: Left Arm, Patient Position: Sitting, Cuff Size: Normal)   Pulse 81   Resp 16   Ht 5\' 7"  (1.702 m)   Wt 200 lb 9.6  oz (91 kg)   SpO2 96%   BMI 31.42 kg/m     Wt Readings from Last 3 Encounters:  11/20/22 200 lb 9.6 oz (91 kg)  11/20/22 201 lb (91.2 kg)  11/10/22 201 lb 12.8 oz (91.5 kg)     GEN:  Well nourished, well developed in no acute distress HEENT: Normal NECK: No JVD; No carotid bruits LYMPHATICS: No lymphadenopathy CARDIAC: RRR, no murmurs, rubs, gallops RESPIRATORY:  Clear to auscultation without rales, wheezing or rhonchi  ABDOMEN: Soft, non-tender, non-distended MUSCULOSKELETAL:  No edema; No deformity  SKIN: Warm and dry NEUROLOGIC:  Alert and oriented x 3 PSYCHIATRIC:  Normal affect   Assessment & Plan Coronary artery disease involving native coronary artery of native heart with angina pectoris (HCC) The patient's EKG is reviewed and shows no ischemic changes.  He is having some atypical symptoms in the upper back and he is concerned about his heart.  He has not had any stress testing done in the past 10 years.  I recommended an exercise Myoview stress test.  He will continue his current medical management which includes aspirin and clopidogrel in the setting of a first generation DES, and ACE inhibitor in the setting of diabetes, a beta-blocker, and high intensity statin drug. Hypertension associated with diabetes (HCC) Blood pressure is well-controlled on enalapril and metoprolol succinate Tobacco abuse Cessation counseling done. Hyperlipidemia associated with type 2 diabetes mellitus (HCC) Lipids are excellent with a cholesterol of 115, LDL 45, HDL 49, triglycerides 114.  Continue current management.       Informed Consent   Shared Decision Making/Informed Consent The risks [chest pain, shortness of breath, cardiac arrhythmias, dizziness, blood pressure fluctuations, myocardial infarction, stroke/transient ischemic attack, nausea, vomiting, allergic reaction, radiation exposure, metallic taste sensation and life-threatening complications (estimated to be 1 in 10,000)],  benefits (risk stratification, diagnosing coronary artery disease, treatment guidance) and alternatives of a nuclear stress test were discussed in detail with Jerome Rodriguez and he agrees to proceed.       Medication Adjustments/Labs and Tests Ordered: Current medicines are reviewed at length with the patient today.  Concerns regarding medicines are outlined above.  Orders Placed This Encounter  Procedures   MYOCARDIAL PERFUSION IMAGING   EKG 12-Lead   No orders of the defined types were placed in this encounter.   Patient Instructions  Testing/Procedures: Exercise Myoview stress test Your physician has requested that you have en exercise stress myoview. For further information please visit [email or web address] Please follow instruction sheet, as given.  Follow-Up: At  [facility], you and your health needs are our priority.  As part of our continuing mission to provide you with exceptional heart care, we have created designated Provider Care Teams.  These Care Teams include your primary Cardiologist (physician) and Advanced Practice Providers (APPs -  Physician Assistants and Nurse Practitioners) who all work together to provide you with the care you need, when you need it.  Your next appointment:   1 year(s)  Provider:   Tonny Bollman, MD      Other Instructions See separate sheet for stress test instructions   Signed, Tonny Bollman, MD  11/20/2022 4:46 PM    [facility]

## 2022-11-20 NOTE — Telephone Encounter (Signed)
Received a call from [facility] stating patient's referral is about to expire and he needs a new one. I sent a message to Renard Hamper asking if she or Dr. Susann Givens could enter a new one and I will send once entered.

## 2022-11-20 NOTE — Assessment & Plan Note (Signed)
Blood pressure is well-controlled on enalapril and metoprolol succinate

## 2022-11-20 NOTE — Progress Notes (Signed)
Diabetes Self-Management Education  Visit Type: Follow-up  Appt. Start Time: 1040 Appt. End Time: 1115  11/20/2022  Jerome Rodriguez, identified by name and date of birth, is a 67 y.o. male with a diagnosis of Diabetes: type 2  .   ASSESSMENT   History includes: type 2 diabetes, HTN, MI.  Labs noted: A1c 08/25/22 6.7% Medications include: metformin, insulin glargine (15 units), canagliflozin.  Supplements: slippery elm   A1c down from 9.2% on 01/06/22, to 6.7% on 08/25/22.   Pt states his time in range has gone down (88% x 30 days, upon previous visit time in range was 91% x 30 days). Assessed pt's Freestyle Libre continuous glucose monitor.   Pt states he has been trying to reduce carbohydrates, french fries, less sweets, etc. He states he has been getting salad or tabouli instead of fries.   Pt states he still sometimes drops low at night (64mg /dL), and reports he drank 4 oz orange/grape soda and then ate rice pudding and his blood glucose went to over 200mg /dL. (Reported same issue on previous visit and discussed rule of 15).   Pt has a puppy and 2 big dogs that he takes walking.   Continue all Previous Goals:   Goal: continue your daily walking.   Goal: aim to make 1/2 of your plate vegetables at least 2x/day.   Goal: start eating breakfast and include at least a protein and complex carbohydrate.   Weight 201 lb (91.2 kg). Body mass index is 31.02 kg/m.   Diabetes Self-Management Education - 11/20/22 1045       Visit Information   Visit Type Follow-up      Health Coping   How would you rate your overall health? Good      Psychosocial Assessment   Patient Belief/Attitude about Diabetes Motivated to manage diabetes    What is the hardest part about your diabetes right now, causing you the most concern, or is the most worrisome to you about your diabetes?   Making healty food and beverage choices    Self-care barriers None    Self-management support Doctor's  office    Other persons present Patient    Patient Concerns Nutrition/Meal planning;Healthy Lifestyle;Monitoring    Special Needs None    Preferred Learning Style No preference indicated    Learning Readiness Ready      Pre-Education Assessment   Patient understands the diabetes disease and treatment process. Needs Review    Patient understands incorporating nutritional management into lifestyle. Needs Review    Patient undertands incorporating physical activity into lifestyle. Needs Review    Patient understands using medications safely. Needs Review    Patient understands monitoring blood glucose, interpreting and using results Needs Review    Patient understands prevention, detection, and treatment of acute complications. Needs Review    Patient understands prevention, detection, and treatment of chronic complications. Needs Review    Patient understands how to develop strategies to address psychosocial issues. Needs Review    Patient understands how to develop strategies to promote health/change behavior. Needs Review      Complications   Last HgB A1C per patient/outside source 6.7 %    How often do you check your blood sugar? > 4 times/day    Fasting Blood glucose range (mg/dL) 540-981      Dietary Intake   Breakfast 1 boiled egg and 1 whole wheat toast    Lunch vegetable chicken and bean soup and lettuce    Dinner feta with tomato,  lettuce, olives, 2 whole wheat toast and apple    Beverage(s) 40-48 oz water, coffee      Activity / Exercise   Activity / Exercise Type Light (walking / raking leaves)    How many days per week do you exercise? 5    How many minutes per day do you exercise? 30    Total minutes per week of exercise 150      Patient Education   Previous Diabetes Education Yes (please comment)    Disease Pathophysiology Explored patient's options for treatment of their diabetes    Healthy Eating Role of diet in the treatment of diabetes and the relationship  between the three main macronutrients and blood glucose level;Reviewed blood glucose goals for pre and post meals and how to evaluate the patients' food intake on their blood glucose level.;Meal timing in regards to the patients' current diabetes medication.    Being Active Role of exercise on diabetes management, blood pressure control and cardiac health.    Monitoring Taught/evaluated CGM (comment)    Acute complications Taught prevention, symptoms, and  treatment of hypoglycemia - the 15 rule.;Discussed and identified patients' prevention, symptoms, and treatment of hyperglycemia.    Chronic complications Identified and discussed with patient  current chronic complications    Diabetes Stress and Support Identified and addressed patients feelings and concerns about diabetes    Lifestyle and Health Coping Lifestyle issues that need to be addressed for better diabetes care      Individualized Goals (developed by patient)   Nutrition General guidelines for healthy choices and portions discussed    Physical Activity Exercise 5-7 days per week;30 minutes per day    Medications take my medication as prescribed    Monitoring  Consistenly use CGM    Problem Solving Eating Pattern    Reducing Risk examine blood glucose patterns;do foot checks daily;treat hypoglycemia with 15 grams of carbs if blood glucose less than 70mg /dL    Health Coping Ask for help with psychological, social, or emotional issues      Patient Self-Evaluation of Goals - Patient rates self as meeting previously set goals (% of time)   Nutrition 50 - 75 % (half of the time)    Physical Activity >75% (most of the time)    Medications >75% (most of the time)    Monitoring >75% (most of the time)    Problem Solving and behavior change strategies  50 - 75 % (half of the time)    Reducing Risk (treating acute and chronic complications) 50 - 75 % (half of the time)    Health Coping 50 - 75 % (half of the time)      Post-Education  Assessment   Patient understands the diabetes disease and treatment process. Comprehends key points    Patient understands incorporating nutritional management into lifestyle. Demonstrates understanding / competency    Patient undertands incorporating physical activity into lifestyle. Demonstrates understanding / competency    Patient understands using medications safely. Demonstrates understanding / competency    Patient understands monitoring blood glucose, interpreting and using results Demonstrates understanding / competency    Patient understands prevention, detection, and treatment of acute complications. Comprehends key points    Patient understands prevention, detection, and treatment of chronic complications. Comprehends key points    Patient understands how to develop strategies to address psychosocial issues. Comprehends key points    Patient understands how to develop strategies to promote health/change behavior. Comprehends key points  Outcomes   Expected Outcomes Demonstrated interest in learning. Expect positive outcomes    Future DMSE 3-4 months    Program Status Not Completed      Subsequent Visit   Since your last visit have you continued or begun to take your medications as prescribed? Yes             Individualized Plan for Diabetes Self-Management Training:   Learning Objective:  Patient will have a greater understanding of diabetes self-management. Patient education plan is to attend individual and/or group sessions per assessed needs and concerns.   Plan:   There are no Patient Instructions on file for this visit.  Expected Outcomes:  Demonstrated interest in learning. Expect positive outcomes  Education material provided: No handout provided on this follow up  If problems or questions, patient to contact team via:  Phone  Future DSME appointment: 3-4 months

## 2022-11-24 ENCOUNTER — Telehealth: Payer: Self-pay | Admitting: Internal Medicine

## 2022-11-24 DIAGNOSIS — E118 Type 2 diabetes mellitus with unspecified complications: Secondary | ICD-10-CM

## 2022-11-24 NOTE — Telephone Encounter (Signed)
-----   Message from Sharlot Gowda sent at 11/24/2022  1:30 PM EST ----- ok ----- Message ----- From: Lawernce Pitts, CMA Sent: 11/23/2022   8:39 AM EST To: Ronnald Nian, MD  Ok to send? ----- Message ----- From: Lynne Logan D Sent: 11/20/2022  11:25 AM EST To: Lawernce Pitts, CMA  Hi Asha, [facility] called me and stated this patient needs a new referral. His current one will expire before his next appointment. Could you or Dr. Susann Givens put one in for him and I will send it to them? Thank you

## 2022-11-24 NOTE — Telephone Encounter (Signed)
I have placed referral

## 2022-12-02 ENCOUNTER — Telehealth: Payer: Self-pay | Admitting: Family Medicine

## 2022-12-02 DIAGNOSIS — L739 Follicular disorder, unspecified: Secondary | ICD-10-CM

## 2022-12-02 NOTE — Telephone Encounter (Signed)
Pt called and states the itching has gotten worse and has spread to the new location you advised for him to put the insulin. He also says it is moving up to his chin.

## 2022-12-04 ENCOUNTER — Other Ambulatory Visit (HOSPITAL_COMMUNITY): Payer: Self-pay

## 2022-12-04 ENCOUNTER — Other Ambulatory Visit: Payer: Self-pay | Admitting: Family Medicine

## 2022-12-07 ENCOUNTER — Other Ambulatory Visit (HOSPITAL_COMMUNITY): Payer: Self-pay

## 2022-12-07 MED ORDER — FREESTYLE LIBRE 3 SENSOR MISC
1 refills | Status: DC
  Filled 2022-12-07: qty 6, 84d supply, fill #0
  Filled 2023-02-12: qty 6, 84d supply, fill #1

## 2022-12-10 ENCOUNTER — Telehealth (HOSPITAL_COMMUNITY): Payer: Self-pay | Admitting: *Deleted

## 2022-12-10 NOTE — Telephone Encounter (Signed)
LDM on voice mail about test on 12/11/22 at 7:30. Patient was also reminded not to take his metoprolol.

## 2022-12-10 NOTE — Telephone Encounter (Signed)
Pt returning your call, Pt would like a call back

## 2022-12-11 ENCOUNTER — Ambulatory Visit (HOSPITAL_COMMUNITY): Payer: PPO | Attending: Cardiovascular Disease

## 2022-12-11 ENCOUNTER — Other Ambulatory Visit (HOSPITAL_COMMUNITY): Payer: Self-pay

## 2022-12-11 DIAGNOSIS — I25119 Atherosclerotic heart disease of native coronary artery with unspecified angina pectoris: Secondary | ICD-10-CM | POA: Insufficient documentation

## 2022-12-11 LAB — MYOCARDIAL PERFUSION IMAGING
Angina Index: 0
Duke Treadmill Score: 3
Estimated workload: 9.6
Exercise duration (min): 7 min
Exercise duration (sec): 41 s
LV dias vol: 64 mL (ref 62–150)
LV sys vol: 24 mL
MPHR: 152 {beats}/min
Nuc Stress EF: 63 %
Peak HR: 146 {beats}/min
Percent HR: 96 %
Rest HR: 77 {beats}/min
Rest Nuclear Isotope Dose: 10.4 mCi
SDS: 0
SRS: 0
SSS: 0
ST Depression (mm): 1 mm
Stress Nuclear Isotope Dose: 31 mCi
TID: 1.06

## 2022-12-11 MED ORDER — TECHNETIUM TC 99M TETROFOSMIN IV KIT
10.4000 | PACK | Freq: Once | INTRAVENOUS | Status: AC | PRN
  Administered 2022-12-11: 10.4 via INTRAVENOUS

## 2022-12-11 MED ORDER — TECHNETIUM TC 99M TETROFOSMIN IV KIT
31.0000 | PACK | Freq: Once | INTRAVENOUS | Status: AC | PRN
  Administered 2022-12-11: 31 via INTRAVENOUS

## 2022-12-14 ENCOUNTER — Encounter: Payer: Self-pay | Admitting: Family Medicine

## 2022-12-21 ENCOUNTER — Other Ambulatory Visit (HOSPITAL_COMMUNITY): Payer: Self-pay

## 2022-12-21 ENCOUNTER — Other Ambulatory Visit: Payer: Self-pay

## 2022-12-23 ENCOUNTER — Ambulatory Visit: Payer: PPO | Admitting: Family Medicine

## 2022-12-23 ENCOUNTER — Other Ambulatory Visit (HOSPITAL_COMMUNITY): Payer: Self-pay

## 2023-01-12 DIAGNOSIS — L2989 Other pruritus: Secondary | ICD-10-CM | POA: Diagnosis not present

## 2023-01-14 ENCOUNTER — Other Ambulatory Visit (HOSPITAL_COMMUNITY): Payer: Self-pay

## 2023-01-28 ENCOUNTER — Other Ambulatory Visit (HOSPITAL_COMMUNITY): Payer: Self-pay

## 2023-01-28 ENCOUNTER — Other Ambulatory Visit: Payer: Self-pay | Admitting: Family Medicine

## 2023-01-28 ENCOUNTER — Encounter: Payer: Self-pay | Admitting: Family Medicine

## 2023-01-28 ENCOUNTER — Other Ambulatory Visit: Payer: Self-pay

## 2023-01-28 ENCOUNTER — Ambulatory Visit: Payer: PPO | Admitting: Family Medicine

## 2023-01-28 VITALS — BP 112/66 | HR 75 | Wt 202.6 lb

## 2023-01-28 DIAGNOSIS — E118 Type 2 diabetes mellitus with unspecified complications: Secondary | ICD-10-CM

## 2023-01-28 DIAGNOSIS — E1169 Type 2 diabetes mellitus with other specified complication: Secondary | ICD-10-CM

## 2023-01-28 DIAGNOSIS — I152 Hypertension secondary to endocrine disorders: Secondary | ICD-10-CM | POA: Diagnosis not present

## 2023-01-28 DIAGNOSIS — E785 Hyperlipidemia, unspecified: Secondary | ICD-10-CM | POA: Diagnosis not present

## 2023-01-28 DIAGNOSIS — J069 Acute upper respiratory infection, unspecified: Secondary | ICD-10-CM

## 2023-01-28 DIAGNOSIS — I2510 Atherosclerotic heart disease of native coronary artery without angina pectoris: Secondary | ICD-10-CM

## 2023-01-28 DIAGNOSIS — E1159 Type 2 diabetes mellitus with other circulatory complications: Secondary | ICD-10-CM

## 2023-01-28 DIAGNOSIS — H9112 Presbycusis, left ear: Secondary | ICD-10-CM | POA: Diagnosis not present

## 2023-01-28 LAB — POCT GLYCOSYLATED HEMOGLOBIN (HGB A1C): Hemoglobin A1C: 6.2 % — AB (ref 4.0–5.6)

## 2023-01-28 LAB — POCT UA - MICROALBUMIN
Albumin/Creatinine Ratio, Urine, POC: 72.2
Creatinine, POC: <5 mg/dL
Microalbumin Ur, POC: <6.9 mg/L

## 2023-01-28 MED ORDER — METFORMIN HCL ER 750 MG PO TB24
750.0000 mg | ORAL_TABLET | Freq: Two times a day (BID) | ORAL | 1 refills | Status: DC
  Filled 2023-01-28: qty 180, 90d supply, fill #0
  Filled 2023-04-27: qty 180, 90d supply, fill #1

## 2023-01-28 MED ORDER — CLOPIDOGREL BISULFATE 75 MG PO TABS
75.0000 mg | ORAL_TABLET | Freq: Every day | ORAL | 3 refills | Status: AC
  Filled 2023-01-28: qty 90, 90d supply, fill #0
  Filled 2023-05-21: qty 90, 90d supply, fill #1
  Filled 2023-08-19: qty 90, 90d supply, fill #2
  Filled 2023-11-29: qty 90, 90d supply, fill #3

## 2023-01-28 NOTE — Progress Notes (Signed)
Subjective:    Patient ID: Jerome Rodriguez, male    DOB: [birth year], 68 y.o.   MRN: [id number]  Jerome Rodriguez is a 68 y.o. male who presents for follow-up of Type 2 diabetes mellitus.  Patient is checking home blood sugars.   Home blood sugar records: BGs range between 69 and 250+ How often is blood sugars being checked: CGM Current symptoms/problems include polyuria and have been unchanged. Daily foot checks: yes   Any foot concerns: none Last eye exam: October 2024 Exercise: Home exercise routine includes walking 1.5 hrs per day. He has a 3-day history of fatigue, slight sore throat CYMA slight cough but no fever or chills.  He continues on Soliqua at 15 units as well as metformin.  He has had several episodes of low blood sugar.  Further discussion with him indicates that he has skipped meals which the AGP report accurately shows.  He sometimes skips meals and then eats the wrong things that makes his blood sugars go up.  He does carry sugar around with him in case that happens.  Continues on Vasotec as well as Toprol.  He is also taking Plavix and continues on Lipitor.  He has had some difficulty with his hearing aid on the left.  He has seen cardiology recently and apparently had a normal evaluation.  He saw dermatology recently and does have a local reaction from the shots and has been told to use steroid cream for that. The following portions of the patient's history were reviewed and updated as appropriate: allergies, current medications, past medical history, past social history and problem list.  ROS as in subjective above.     Objective:    Physical Exam Alert and in no distress. Tympanic membranes and canals are normal. Pharyngeal area is normal. Neck is supple without adenopathy or thyromegaly. Cardiac exam shows a regular sinus rhythm without murmurs or gallops. Lungs are clear to auscultation.  Diabetic foot exam done and is normal Hemoglobin A1c is 6.2 Blood  pressure 112/66, pulse 75, weight 202 lb 9.6 oz (91.9 kg), SpO2 97%.  Lab Review    Latest Ref Rng & Units 08/25/2022   11:04 AM 04/09/2022    1:54 PM 04/09/2022    1:42 PM 01/06/2022    3:18 PM 01/06/2022    3:06 PM  Diabetic Labs  HbA1c 4.0 - 5.6 % 6.7   6.7     Microalbumin mg/L    <5.0    Micro/Creat Ratio     <9.1    Chol 100 - 199 mg/dL  086    578   HDL >46 mg/dL  49    54   Calc LDL 0 - 99 mg/dL  45    81   Triglycerides 0 - 149 mg/dL  962    952   Creatinine 0.76 - 1.27 mg/dL     8.41       03/28/4008   11:45 AM 12/11/2022    7:27 AM 11/20/2022    2:23 PM 11/20/2022   11:22 AM 11/10/2022   10:47 AM  BP/Weight  Systolic BP 112  272  110  Diastolic BP 66  60  58  Wt. (Lbs) 202.6 200 200.6 201 201.8  BMI 31.73 kg/m2 31.32 kg/m2 31.42 kg/m2 31.02 kg/m2 31.14 kg/m2      Latest Ref Rng & Units 01/28/2023   11:45 AM 08/10/2022   11:50 AM  Foot/eye exam completion dates  Eye Exam No Retinopathy  No Retinopathy  Foot Form Completion  Done      This result is from an external source.    Jerome Rodriguez  reports that he has been smoking cigars. He has been exposed to tobacco smoke. He has never used smokeless tobacco. He reports that he does not drink alcohol and does not use drugs.     Assessment & Plan:    Hyperlipidemia associated with type 2 diabetes mellitus (HCC) - Plan: Lipid panel  Hypertension associated with diabetes (HCC) - Plan: CBC with Differential/Platelet, Comprehensive metabolic panel  Type 2 diabetes with complication (HCC) - Plan: CBC with Differential/Platelet, Comprehensive metabolic panel, Lipid panel, POCT glycosylated hemoglobin (Hb A1C), POCT UA - Microalbumin  ASHD (arteriosclerotic heart disease) - Plan: clopidogrel (PLAVIX) 75 MG tablet  Acute URI  Presbycusis of left ear with unrestricted hearing of right ear  I reviewed the blood sugar results with him and it indicates that he has skipped meals and then what ate high calorie foods to help make up  for that.  Encouraged him to eat 4 meals per day rather than 2 and to plan his schedule around that.  He is also to check with audiologist to make sure that his hearing aid on the left is working properly.  He would otherwise continue on his present medication regimen and start back into exercising as his cold weather has cut back on his walking.

## 2023-01-28 NOTE — Patient Instructions (Signed)
make your meals in the 4 different time frames during the day and schedule that in your work schedule

## 2023-01-29 ENCOUNTER — Encounter: Payer: Self-pay | Admitting: Family Medicine

## 2023-01-29 LAB — CBC WITH DIFFERENTIAL/PLATELET
Basophils Absolute: 0.1 10*3/uL (ref 0.0–0.2)
Basos: 1 %
EOS (ABSOLUTE): 0.2 10*3/uL (ref 0.0–0.4)
Eos: 4 %
Hematocrit: 42.4 % (ref 37.5–51.0)
Hemoglobin: 14.4 g/dL (ref 13.0–17.7)
Immature Grans (Abs): 0 10*3/uL (ref 0.0–0.1)
Immature Granulocytes: 1 %
Lymphocytes Absolute: 1.9 10*3/uL (ref 0.7–3.1)
Lymphs: 30 %
MCH: 29.9 pg (ref 26.6–33.0)
MCHC: 34 g/dL (ref 31.5–35.7)
MCV: 88 fL (ref 79–97)
Monocytes Absolute: 0.6 10*3/uL (ref 0.1–0.9)
Monocytes: 9 %
Neutrophils Absolute: 3.6 10*3/uL (ref 1.4–7.0)
Neutrophils: 55 %
Platelets: 249 10*3/uL (ref 150–450)
RBC: 4.82 x10E6/uL (ref 4.14–5.80)
RDW: 12.6 % (ref 11.6–15.4)
WBC: 6.4 10*3/uL (ref 3.4–10.8)

## 2023-01-29 LAB — COMPREHENSIVE METABOLIC PANEL
ALT: 16 [IU]/L (ref 0–44)
AST: 21 [IU]/L (ref 0–40)
Albumin: 4.6 g/dL (ref 3.9–4.9)
Alkaline Phosphatase: 124 [IU]/L — ABNORMAL HIGH (ref 44–121)
BUN/Creatinine Ratio: 19 (ref 10–24)
BUN: 19 mg/dL (ref 8–27)
Bilirubin Total: 0.5 mg/dL (ref 0.0–1.2)
CO2: 26 mmol/L (ref 20–29)
Calcium: 9 mg/dL (ref 8.6–10.2)
Chloride: 100 mmol/L (ref 96–106)
Creatinine, Ser: 0.99 mg/dL (ref 0.76–1.27)
Globulin, Total: 2.2 g/dL (ref 1.5–4.5)
Glucose: 111 mg/dL — ABNORMAL HIGH (ref 70–99)
Potassium: 4.3 mmol/L (ref 3.5–5.2)
Sodium: 139 mmol/L (ref 134–144)
Total Protein: 6.8 g/dL (ref 6.0–8.5)
eGFR: 83 mL/min/{1.73_m2} (ref >59)

## 2023-01-29 LAB — LIPID PANEL
Chol/HDL Ratio: 3 {ratio} (ref 0.0–5.0)
Cholesterol, Total: 142 mg/dL (ref 100–199)
HDL: 47 mg/dL (ref >39)
LDL Chol Calc (NIH): 75 mg/dL (ref 0–99)
Triglycerides: 109 mg/dL (ref 0–149)
VLDL Cholesterol Cal: 20 mg/dL (ref 5–40)

## 2023-02-06 NOTE — Telephone Encounter (Signed)
 done

## 2023-02-12 ENCOUNTER — Encounter: Payer: Self-pay | Admitting: Family Medicine

## 2023-02-12 ENCOUNTER — Encounter: Payer: Self-pay | Admitting: Dietician

## 2023-02-12 ENCOUNTER — Encounter: Payer: PPO | Attending: Family Medicine | Admitting: Dietician

## 2023-02-12 ENCOUNTER — Other Ambulatory Visit (HOSPITAL_COMMUNITY): Payer: Self-pay

## 2023-02-12 VITALS — Wt 203.0 lb

## 2023-02-12 DIAGNOSIS — Z794 Long term (current) use of insulin: Secondary | ICD-10-CM | POA: Insufficient documentation

## 2023-02-12 DIAGNOSIS — E119 Type 2 diabetes mellitus without complications: Secondary | ICD-10-CM | POA: Diagnosis not present

## 2023-02-12 NOTE — Progress Notes (Signed)
 Diabetes Self-Management Education  Visit Type: Follow-up  Appt. Start Time: 1015 Appt. End Time: 1045  02/12/2023  [name], identified by name and date of birth, is a 68 y.o. male with a diagnosis of Diabetes: type 2  .   ASSESSMENT  History includes: type 2 diabetes, HTN, MI.  Labs noted: A1c 01/28/23 A1c 6.2% Medications include: metformin , insulin  glargine (15 units) Supplements: slippery elm  Wt Readings from Last 3 Encounters:  02/12/23 203 lb (92.1 kg)  01/28/23 202 lb 9.6 oz (91.9 kg)  12/11/22 200 lb (90.7 kg)   A1c down from 9.2% on 01/06/22, to 6.7% on 08/25/22, to 6.2% on 01/28/23. Pt reports he continues to use his freestyle libre CGM. Pt reports he has not been going low at night as frequently, but has been experiencing occasional high/very high episodes. Pt is able to identify directly that this occurs after times that he skips meals and then over eats. Assessed CGM and time in range x30 days has gone down to 82%, from 88% at previous visit. Assessed pt's Freestyle Libre continuous glucose monitor. Current blood glucose is 129mg /dL.    Pt states he hasn't been walking as much due to the cold weather but is planning on starting back at his daily walking. Pt states he usually carries a few pieces of candy with him when he walks in case of hypoglycemia.   Pt states he has been itching where he injects his insulin .   Pt reports he has been drinking at least 3 water bottles daily and aiming to eat 4 times evenly throughout the day.   Continue all Previous Goals:    Goal: continue your daily walking.   Goal: aim to make 1/2 of your plate vegetables at least 2x/day.   Goal: start eating breakfast and include at least a protein and complex carbohydrate.   Weight 203 lb (92.1 kg). Body mass index is 31.79 kg/m.   Diabetes Self-Management Education - 02/12/23 1015       Visit Information   Visit Type Follow-up      Health Coping   How would you rate  your overall health? Good      Psychosocial Assessment   Patient Belief/Attitude about Diabetes Motivated to manage diabetes    What is the hardest part about your diabetes right now, causing you the most concern, or is the most worrisome to you about your diabetes?   Making healty food and beverage choices    Self-care barriers None    Self-management support Doctor's office    Other persons present Patient    Patient Concerns Nutrition/Meal planning    Special Needs None    Preferred Learning Style No preference indicated    Learning Readiness Ready      Pre-Education Assessment   Patient understands the diabetes disease and treatment process. Needs Review    Patient understands incorporating nutritional management into lifestyle. Needs Review    Patient undertands incorporating physical activity into lifestyle. Needs Review    Patient understands using medications safely. Needs Review    Patient understands monitoring blood glucose, interpreting and using results Needs Review    Patient understands prevention, detection, and treatment of acute complications. Needs Review    Patient understands prevention, detection, and treatment of chronic complications. Needs Review    Patient understands how to develop strategies to address psychosocial issues. Needs Review    Patient understands how to develop strategies to promote health/change behavior. Needs Review  Complications   Last HgB A1C per patient/outside source 6.2 %    How often do you check your blood sugar? > 4 times/day    Fasting Blood glucose range (mg/dL) 29-870    Postprandial Blood glucose range (mg/dL) 869-820    Have you had a dilated eye exam in the past 12 months? Yes    Have you had a dental exam in the past 12 months? Yes    Are you checking your feet? Yes      Dietary Intake   Breakfast corn on the cob OR 2 peanut butter toast    Snack (morning) none    Lunch salad with vienna sausage and egg and pita     Snack (afternoon) 3:30pm: pita with ground beef and veggies    Dinner veggies salad sub and corn    Beverage(s) 48 oz water, occassional wine or soda      Activity / Exercise   Activity / Exercise Type Light (walking / raking leaves)    How many days per week do you exercise? 4    How many minutes per day do you exercise? 30    Total minutes per week of exercise 120      Patient Education   Previous Diabetes Education Yes (please comment)    Disease Pathophysiology Factors that contribute to the development of diabetes;Explored patient's options for treatment of their diabetes    Healthy Eating Role of diet in the treatment of diabetes and the relationship between the three main macronutrients and blood glucose level;Plate Method;Meal options for control of blood glucose level and chronic complications.;Information on hints to eating out and maintain blood glucose control.;Meal timing in regards to the patients' current diabetes medication.    Being Active Identified with patient nutritional and/or medication changes necessary with exercise.    Medications Reviewed patients medication for diabetes, action, purpose, timing of dose and side effects.    Monitoring Identified appropriate SMBG and/or A1C goals.;Daily foot exams;Yearly dilated eye exam    Acute complications Discussed and identified patients' prevention, symptoms, and treatment of hyperglycemia.;Taught prevention, symptoms, and  treatment of hypoglycemia - the 15 rule.    Chronic complications Relationship between chronic complications and blood glucose control;Identified and discussed with patient  current chronic complications    Diabetes Stress and Support Identified and addressed patients feelings and concerns about diabetes;Role of stress on diabetes;Worked with patient to identify barriers to care and solutions    Lifestyle and Health Coping Lifestyle issues that need to be addressed for better diabetes care      Individualized  Goals (developed by patient)   Nutrition General guidelines for healthy choices and portions discussed    Physical Activity Exercise 5-7 days per week;30 minutes per day    Medications take my medication as prescribed    Monitoring  Test my blood glucose as discussed    Problem Solving Eating Pattern    Reducing Risk examine blood glucose patterns;do foot checks daily;treat hypoglycemia with 15 grams of carbs if blood glucose less than 70mg /dL    Health Coping Ask for help with psychological, social, or emotional issues      Patient Self-Evaluation of Goals - Patient rates self as meeting previously set goals (% of time)   Nutrition >75% (most of the time)    Physical Activity 50 - 75 % (half of the time)    Medications >75% (most of the time)    Monitoring >75% (most of the time)  Problem Solving and behavior change strategies  50 - 75 % (half of the time)    Reducing Risk (treating acute and chronic complications) >75% (most of the time)    Health Coping >75% (most of the time)      Post-Education Assessment   Patient understands the diabetes disease and treatment process. Comprehends key points    Patient understands incorporating nutritional management into lifestyle. Comprehends key points    Patient undertands incorporating physical activity into lifestyle. Comprehends key points    Patient understands using medications safely. Comphrehends key points    Patient understands monitoring blood glucose, interpreting and using results Comprehends key points    Patient understands prevention, detection, and treatment of acute complications. Comprehends key points    Patient understands prevention, detection, and treatment of chronic complications. Comprehends key points    Patient understands how to develop strategies to address psychosocial issues. Comprehends key points    Patient understands how to develop strategies to promote health/change behavior. Comprehends key points       Outcomes   Expected Outcomes Demonstrated interest in learning. Expect positive outcomes    Future DMSE 3-4 months    Program Status Not Completed      Subsequent Visit   Since your last visit have you continued or begun to take your medications as prescribed? Yes             Individualized Plan for Diabetes Self-Management Training:   Learning Objective:  Patient will have a greater understanding of diabetes self-management. Patient education plan is to attend individual and/or group sessions per assessed needs and concerns.   Plan:   There are no Patient Instructions on file for this visit.  Expected Outcomes:  Demonstrated interest in learning. Expect positive outcomes  Education material provided: no handouts on this follow up  If problems or questions, patient to contact team via:  Phone  Future DSME appointment: 3-4 months

## 2023-02-18 ENCOUNTER — Ambulatory Visit
Admission: RE | Admit: 2023-02-18 | Discharge: 2023-02-18 | Disposition: A | Discharge status: Home / Self Care | Payer: PPO | Source: Ambulatory Visit | Attending: Physician Assistant

## 2023-02-18 ENCOUNTER — Ambulatory Visit: Payer: PPO | Admitting: Physician Assistant

## 2023-02-18 ENCOUNTER — Encounter: Payer: Self-pay | Admitting: Physician Assistant

## 2023-02-18 ENCOUNTER — Ambulatory Visit
Admission: RE | Admit: 2023-02-18 | Discharge: 2023-02-18 | Disposition: A | Discharge status: Home / Self Care | Payer: PPO | Attending: Physician Assistant | Admitting: Physician Assistant

## 2023-02-18 VITALS — BP 108/68 | HR 76 | Ht 67.0 in | Wt 202.0 lb

## 2023-02-18 DIAGNOSIS — N200 Calculus of kidney: Secondary | ICD-10-CM

## 2023-02-18 DIAGNOSIS — N3281 Overactive bladder: Secondary | ICD-10-CM

## 2023-02-18 DIAGNOSIS — Z125 Encounter for screening for malignant neoplasm of prostate: Secondary | ICD-10-CM

## 2023-02-18 DIAGNOSIS — R933 Abnormal findings on diagnostic imaging of other parts of digestive tract: Secondary | ICD-10-CM | POA: Diagnosis not present

## 2023-02-18 LAB — BLADDER SCAN AMB NON-IMAGING

## 2023-02-18 NOTE — Progress Notes (Signed)
02/18/2023 10:39 AM   Jerome Rodriguez [birth year] [id number]  CC: Chief Complaint  Patient presents with   Over Active Bladder   HPI: Jerome Rodriguez is a 68 y.o. male with PMH prostatitis, nephrolithiasis, diabetes, and OAB wet previously on Myrbetriq 50 mg who presents today for annual follow-up.   Today he reports he stopped Myrbetriq about 4 months ago and has not noticed any change in his urinary symptoms.  He describes daytime frequency every 2-1/2 to 3 hours and nocturia x 1-2.  Overall he is minimally bothered by his symptoms and wishes to stay off pharmacotherapy.  He is curious about what stone symptoms he should be on the look out for in the future.  Per chart review, his last PSA dated 11/22/2019 was normal at 0.2.  PVR 3 mL.  PMH: Past Medical History:  Diagnosis Date   Abnormal chest CT    a. 07/2012: scattered bilat noncalcified pulm nodules ranging in size from a few mm to a max of 8mm in LLL - rec f/u in 3-6 mos.   CAD (coronary artery disease)    a. NSTEMI 10/04 => LHC: mLAD 99%=> Taxus DES to mLAD and Taxus DES to dLAD;  b. ETT-Myoview 11/2011: normal, EF 73%, no ischemia; c. 07/2012 Cath/PCI: LM <10, LAD 90 ISRp/99 ISRd (3.0x38 Promus DES), LCX 5m, 30-40d, RCA 50-60p, 30d, EF 55-65%.   Diabetes mellitus    Dyslipidemia    ED (erectile dysfunction)    History of kidney stones    HTN (hypertension)    Kidney stone    Myocardial infarction (HCC)    Obesity    Smoker    CIGARS    Surgical History: Past Surgical History:  Procedure Laterality Date   APPENDECTOMY  1976   arthroscopic knee surgery Right    CORONARY STENT PLACEMENT     CYSTOSCOPY/URETEROSCOPY/HOLMIUM LASER/STENT PLACEMENT Left 08/04/2019   Procedure: CYSTOSCOPY/URETEROSCOPY/HOLMIUM LASER/STENT PLACEMENT;  Surgeon: Sondra Come, MD;  Location: [facility] ORS;  Service: Urology;  Laterality: Left;   LEFT HEART CATHETERIZATION WITH CORONARY ANGIOGRAM N/A 07/11/2012   Procedure: LEFT  HEART CATHETERIZATION WITH CORONARY ANGIOGRAM;  Surgeon: Peter M [place]az[place], MD;  Location: [facility] CATH LAB;  Service: Cardiovascular;  Laterality: N/A;   right shoulder surgery      Home Medications:  Allergies as of 02/18/2023       Reactions   Lactose Intolerance (gi)    Prasugrel Other (See Comments)   dizziness        Medication List        Accurate as of February 18, 2023 10:39 AM. If you have any questions, ask your nurse or doctor.          aspirin EC 81 MG tablet Take 1 tablet (81 mg total) by mouth daily.   atorvastatin 40 MG tablet Commonly known as: LIPITOR Take 1 tablet (40 mg total) by mouth daily.   clopidogrel 75 MG tablet Commonly known as: PLAVIX Take 1 tablet (75 mg total) by mouth daily.   enalapril 2.5 MG tablet Commonly known as: VASOTEC Take 1 tablet (2.5 mg total) by mouth daily.   FreeStyle Libre 3 Sensor Misc Place 1 sensor on the skin every 14 days. Use to check glucose continuously   Insupen Pen Needles 32G X 4 MM Misc Generic drug: Insulin Pen Needle Use daily as directed   loratadine 10 MG tablet Commonly known as: CLARITIN Take 20 mg by mouth daily.   metFORMIN 750 MG 24 hr  tablet Commonly known as: GLUCOPHAGE-XR Take 1 tablet (750 mg total) by mouth in the morning and at bedtime.   metoprolol succinate 25 MG 24 hr tablet Commonly known as: TOPROL-XL Take 1 tablet (25 mg total) by mouth daily.   multivitamin tablet Take 1 tablet by mouth daily.   nitroGLYCERIN 0.4 MG SL tablet Commonly known as: NITROSTAT Place 1 tablet (0.4 mg total) under the tongue every 5 (five) minutes x 3 doses as needed for chest pain.   Soliqua 100-33 UNT-MCG/ML Sopn Generic drug: Insulin Glargine-Lixisenatide Inject 15 Units into the skin daily.        Allergies:  Allergies  Allergen Reactions   Lactose Intolerance (Gi)    Prasugrel Other (See Comments)    dizziness    Family History: Family History  Problem Relation Age of Onset    Heart attack Mother        died in sleep   Diabetic kidney disease Mother    Diabetic kidney disease Father    Heart attack Father    Heart failure Father     Social History:   reports that he has been smoking cigars. He has been exposed to tobacco smoke. He has never used smokeless tobacco. He reports that he does not drink alcohol and does not use drugs.  Physical Exam: BP 108/68   Pulse 76   Ht 5\' 7"  (1.702 m)   Wt 202 lb (91.6 kg)   BMI 31.64 kg/m   Constitutional:  Alert and oriented, no acute distress, nontoxic appearing HEENT: [place], AT Cardiovascular: No clubbing, cyanosis, or edema Respiratory: Normal respiratory effort, no increased work of breathing Skin: No rashes, bruises or suspicious lesions Neurologic: Grossly intact, no focal deficits, moving all 4 extremities Psychiatric: Normal mood and affect  Laboratory Data: Results for orders placed or performed in visit on 02/18/23  BLADDER SCAN AMB NON-IMAGING   Collection Time: 02/18/23 10:14 AM  Result Value Ref Range   Scan Result 3ml    Assessment & Plan:   1. OAB (overactive bladder) (Primary) Minimally symptomatic off pharmacotherapy, will defer resuming it at this time and continue to monitor.  He is emptying appropriately. - BLADDER SCAN AMB NON-IMAGING  2. Nephrolithiasis Will obtain screening KUB today and call him with his results. - DG Abd 1 View; Future  3. Screening PSA (prostate specific antigen) Last PSA was over 3 years ago, will repeat today.  If it remains very low, okay to continue this every other year. - PSA Total (Reflex To Free)   Return for Will call with results.  Carman Ching, PA-C  [facility] [place] [address] [place], [place] [address] [phone]

## 2023-02-19 LAB — PSA TOTAL (REFLEX TO FREE): Prostate Specific Ag, Serum: 0.2 ng/mL (ref 0.0–4.0)

## 2023-02-24 ENCOUNTER — Ambulatory Visit: Payer: PPO | Admitting: Family Medicine

## 2023-02-28 ENCOUNTER — Other Ambulatory Visit: Payer: Self-pay | Admitting: Family Medicine

## 2023-02-28 DIAGNOSIS — E1169 Type 2 diabetes mellitus with other specified complication: Secondary | ICD-10-CM

## 2023-03-01 ENCOUNTER — Other Ambulatory Visit (HOSPITAL_COMMUNITY): Payer: Self-pay

## 2023-03-01 MED ORDER — ATORVASTATIN CALCIUM 40 MG PO TABS
40.0000 mg | ORAL_TABLET | Freq: Every day | ORAL | 0 refills | Status: DC
  Filled 2023-03-01: qty 90, 90d supply, fill #0

## 2023-03-02 ENCOUNTER — Encounter: Payer: Self-pay | Admitting: Internal Medicine

## 2023-03-16 ENCOUNTER — Other Ambulatory Visit (HOSPITAL_COMMUNITY): Payer: Self-pay

## 2023-03-16 ENCOUNTER — Other Ambulatory Visit: Payer: Self-pay | Admitting: Family Medicine

## 2023-03-16 DIAGNOSIS — E1159 Type 2 diabetes mellitus with other circulatory complications: Secondary | ICD-10-CM

## 2023-03-16 DIAGNOSIS — I2510 Atherosclerotic heart disease of native coronary artery without angina pectoris: Secondary | ICD-10-CM

## 2023-03-16 MED ORDER — ENALAPRIL MALEATE 2.5 MG PO TABS
2.5000 mg | ORAL_TABLET | Freq: Every day | ORAL | 0 refills | Status: DC
  Filled 2023-03-16: qty 90, 90d supply, fill #0

## 2023-03-16 MED ORDER — METOPROLOL SUCCINATE ER 25 MG PO TB24
25.0000 mg | ORAL_TABLET | Freq: Every day | ORAL | 0 refills | Status: DC
  Filled 2023-03-16: qty 90, 90d supply, fill #0

## 2023-03-17 ENCOUNTER — Other Ambulatory Visit: Payer: Self-pay | Admitting: Family Medicine

## 2023-03-17 ENCOUNTER — Other Ambulatory Visit (HOSPITAL_COMMUNITY): Payer: Self-pay

## 2023-03-17 DIAGNOSIS — E118 Type 2 diabetes mellitus with unspecified complications: Secondary | ICD-10-CM

## 2023-03-17 MED ORDER — SOLIQUA 100-33 UNT-MCG/ML ~~LOC~~ SOPN
15.0000 [IU] | PEN_INJECTOR | Freq: Every day | SUBCUTANEOUS | 5 refills | Status: AC
  Filled 2023-03-17: qty 12, 80d supply, fill #0
  Filled 2023-06-03: qty 12, 80d supply, fill #1
  Filled 2023-08-27: qty 12, 80d supply, fill #2
  Filled 2023-11-15: qty 12, 80d supply, fill #3
  Filled 2024-02-01: qty 12, 80d supply, fill #4

## 2023-03-17 MED ORDER — INSUPEN PEN NEEDLES 32G X 4 MM MISC
Freq: Every day | 3 refills | Status: DC
  Filled 2023-03-17: qty 100, 90d supply, fill #0
  Filled 2023-06-14: qty 100, 90d supply, fill #1
  Filled 2023-09-16: qty 100, 90d supply, fill #2

## 2023-03-19 ENCOUNTER — Other Ambulatory Visit (HOSPITAL_COMMUNITY): Payer: Self-pay

## 2023-04-19 ENCOUNTER — Other Ambulatory Visit: Payer: Self-pay | Admitting: Family Medicine

## 2023-04-19 ENCOUNTER — Other Ambulatory Visit (HOSPITAL_COMMUNITY): Payer: Self-pay

## 2023-04-19 MED ORDER — FREESTYLE LIBRE 3 SENSOR MISC
1 refills | Status: AC
  Filled 2023-04-19: qty 6, 84d supply, fill #0

## 2023-04-27 ENCOUNTER — Other Ambulatory Visit (HOSPITAL_COMMUNITY): Payer: Self-pay

## 2023-05-05 ENCOUNTER — Other Ambulatory Visit (HOSPITAL_COMMUNITY): Payer: Self-pay

## 2023-05-05 ENCOUNTER — Other Ambulatory Visit: Payer: Self-pay

## 2023-05-05 DIAGNOSIS — M75122 Complete rotator cuff tear or rupture of left shoulder, not specified as traumatic: Secondary | ICD-10-CM | POA: Diagnosis not present

## 2023-05-05 DIAGNOSIS — M19012 Primary osteoarthritis, left shoulder: Secondary | ICD-10-CM | POA: Diagnosis not present

## 2023-05-05 DIAGNOSIS — G8929 Other chronic pain: Secondary | ICD-10-CM | POA: Diagnosis not present

## 2023-05-05 DIAGNOSIS — S83232D Complex tear of medial meniscus, current injury, left knee, subsequent encounter: Secondary | ICD-10-CM | POA: Diagnosis not present

## 2023-05-05 DIAGNOSIS — M65321 Trigger finger, right index finger: Secondary | ICD-10-CM | POA: Diagnosis not present

## 2023-05-05 MED ORDER — DICLOFENAC SODIUM 1 % EX GEL
2.0000 g | Freq: Four times a day (QID) | CUTANEOUS | 0 refills | Status: DC
  Filled 2023-05-05: qty 500, 30d supply, fill #0

## 2023-05-10 ENCOUNTER — Other Ambulatory Visit (HOSPITAL_COMMUNITY): Payer: Self-pay

## 2023-05-14 DIAGNOSIS — M65321 Trigger finger, right index finger: Secondary | ICD-10-CM | POA: Diagnosis not present

## 2023-05-14 DIAGNOSIS — M65322 Trigger finger, left index finger: Secondary | ICD-10-CM | POA: Diagnosis not present

## 2023-05-14 DIAGNOSIS — M79644 Pain in right finger(s): Secondary | ICD-10-CM | POA: Diagnosis not present

## 2023-05-14 DIAGNOSIS — M79645 Pain in left finger(s): Secondary | ICD-10-CM | POA: Diagnosis not present

## 2023-05-24 ENCOUNTER — Other Ambulatory Visit (HOSPITAL_COMMUNITY): Payer: Self-pay

## 2023-05-27 ENCOUNTER — Ambulatory Visit (INDEPENDENT_AMBULATORY_CARE_PROVIDER_SITE_OTHER): Payer: PPO | Admitting: Family Medicine

## 2023-05-27 ENCOUNTER — Encounter: Payer: Self-pay | Admitting: Family Medicine

## 2023-05-27 VITALS — BP 90/60 | HR 71 | Wt 203.4 lb

## 2023-05-27 DIAGNOSIS — I152 Hypertension secondary to endocrine disorders: Secondary | ICD-10-CM

## 2023-05-27 DIAGNOSIS — E118 Type 2 diabetes mellitus with unspecified complications: Secondary | ICD-10-CM | POA: Diagnosis not present

## 2023-05-27 DIAGNOSIS — R7989 Other specified abnormal findings of blood chemistry: Secondary | ICD-10-CM

## 2023-05-27 DIAGNOSIS — R151 Fecal smearing: Secondary | ICD-10-CM | POA: Diagnosis not present

## 2023-05-27 DIAGNOSIS — E1159 Type 2 diabetes mellitus with other circulatory complications: Secondary | ICD-10-CM | POA: Diagnosis not present

## 2023-05-27 DIAGNOSIS — E1169 Type 2 diabetes mellitus with other specified complication: Secondary | ICD-10-CM

## 2023-05-27 DIAGNOSIS — E785 Hyperlipidemia, unspecified: Secondary | ICD-10-CM | POA: Diagnosis not present

## 2023-05-27 DIAGNOSIS — E1121 Type 2 diabetes mellitus with diabetic nephropathy: Secondary | ICD-10-CM | POA: Diagnosis not present

## 2023-05-27 DIAGNOSIS — E669 Obesity, unspecified: Secondary | ICD-10-CM | POA: Diagnosis not present

## 2023-05-27 LAB — POCT GLYCOSYLATED HEMOGLOBIN (HGB A1C): Hemoglobin A1C: 6.8 % — AB (ref 4.0–5.6)

## 2023-05-27 NOTE — Progress Notes (Signed)
 Subjective:    Patient ID: Jerome Rodriguez, male    DOB: 11-19-1955, 68 y.o.   MRN: [id number]  Jerome Rodriguez is a 68 y.o. male who presents for follow-up of Type 2 diabetes mellitus.  Home blood sugar records: sometime is gets high other it drops really low Current symptoms/problems include none and have been stable. Daily foot checks: yes   Any foot concerns: no How often blood sugars checked: freestyle libre 3  Exercise: walks daily  Diet: general He continues on Soliqua  as well as metformin  and having no difficulty with it.  He is looking at his blood sugar readings periodically.  Continues on Vasotec , metoprolol  also taking atorvastatin .  Review of his record indicates an elevated TSH.  His physical activity is minimal.  He does complain of a couple lesions that he would like me to look at on his scalp.  He also states that he is not having difficulty with swallowing and has had this for well over a year.  He states that his bowel habits are usually quite normal but usually after a BM, within an hour after that he will notice soiling of his underwear.  No rectal discomfort, blood or pus in his stool, diarrhea.  His last Cologuard was in 2022. The following portions of the patient's history were reviewed and updated as appropriate: allergies, current medications, past medical history, past social history and problem list.  ROS as in subjective above.     Objective:     Physical Exam Alert and in no distress he had 2 lesions on the scalp, both of which were benign in appearance.  Hemoglobin A1c is 6.8. I did review the CGM with him.  There are several occasions that his lunches were much higher than they needed to be.  I reviewed that data with him and strongly suggested that he make some changes to specifically for the lunches.   Lab Review    Latest Ref Rng & Units 01/28/2023    1:32 PM 01/28/2023    1:01 PM 08/25/2022   11:04 AM 04/09/2022    1:54 PM 04/09/2022    1:42 PM   Diabetic Labs  HbA1c 4.0 - 5.6 % 6.2   6.7   6.7   Microalbumin mg/L <6.9       Micro/Creat Ratio  72.2       Chol 100 - 199 mg/dL  409   811    HDL >91 mg/dL  47   49    Calc LDL 0 - 99 mg/dL  75   45    Triglycerides 0 - 149 mg/dL  478   295    Creatinine 0.76 - 1.27 mg/dL  6.21          03/12/6576   11:25 AM 02/18/2023   10:12 AM 02/12/2023   10:46 AM 01/28/2023   11:45 AM 12/11/2022    7:27 AM  BP/Weight  Systolic BP 90 108  112   Diastolic BP 60 68  66   Wt. (Lbs) 203.4 202 203 202.6 200  BMI 31.86 kg/m2 31.64 kg/m2 31.79 kg/m2 31.73 kg/m2 31.32 kg/m2      Latest Ref Rng & Units 01/28/2023   11:45 AM 08/10/2022   11:50 AM  Foot/eye exam completion dates  Eye Exam No Retinopathy  No Retinopathy      Foot Form Completion  Done      This result is from an external source.    [place]  reports that  he has been smoking cigars. He has been exposed to tobacco smoke. He has never used smokeless tobacco. He reports that he does not drink alcohol and does not use drugs.     Assessment & Plan:    Type 2 diabetes with complication (HCC) - Plan: POCT glycosylated hemoglobin (Hb A1C)  Obesity (BMI 30-39.9)  Hyperlipidemia associated with type 2 diabetes mellitus (HCC)  Hypertension associated with diabetes (HCC)  Diabetic nephropathy with proteinuria (HCC)  Elevated TSH - Plan: TSH  Soiling - Plan: Ambulatory referral to Gastroenterology Since this time for colon cancer screening and he is having soiling, I will refer him to GI for further evaluation of that.  Otherwise he will continue on his present medication regimen.  Reassured him that I do not see any problems with the scalp lesions.

## 2023-05-28 ENCOUNTER — Ambulatory Visit: Payer: Self-pay | Admitting: Family Medicine

## 2023-05-28 ENCOUNTER — Encounter: Payer: Self-pay | Admitting: Dietician

## 2023-05-28 ENCOUNTER — Encounter: Payer: PPO | Attending: Family Medicine | Admitting: Dietician

## 2023-05-28 VITALS — Wt 204.0 lb

## 2023-05-28 DIAGNOSIS — E119 Type 2 diabetes mellitus without complications: Secondary | ICD-10-CM | POA: Insufficient documentation

## 2023-05-28 DIAGNOSIS — Z794 Long term (current) use of insulin: Secondary | ICD-10-CM | POA: Insufficient documentation

## 2023-05-28 LAB — TSH: TSH: 4.94 u[IU]/mL — ABNORMAL HIGH (ref 0.450–4.500)

## 2023-05-28 NOTE — Patient Instructions (Signed)
 New Goals:   Goal: Have a bedtime snack containing carb + protein to prevent hypoglycemia during the night (peanut butter on crackers, cheese on crackers).  Goal: reduce rice serving at lunch.    Continue all Previous Goals:    Goal: continue your daily walking.   Goal: aim to make 1/2 of your plate vegetables at least 2x/day.   Goal: start eating breakfast and include at least a protein and complex carbohydrate.

## 2023-05-28 NOTE — Progress Notes (Signed)
 Diabetes Self-Management Education  Visit Type: Follow-up  Appt. Start Time: 1010 Appt. End Time: 1050  05/28/2023  [name], identified by name and date of birth, is a 68 y.o. male with a diagnosis of Diabetes: type 2 .   ASSESSMENT  History includes: type 2 diabetes, HTN, MI.  Labs noted: A1c 05/27/23 6.8% Medications include: metformin , insulin  glargine (15 units) Supplements: MVI   Time in Range x 30 days: Time in range (70-180 mg/dL): 82 % (Goal >44%) Time High (181-250 mg/dL) 16 % (Goal < 01%) Time Very High (>250 mg/dl) 2 % (Goal < 5%) Time Low (54-69 mg/dL): 0 % (Goal is <0%) Time Very Low (<54) 0%  (Goal <1%)  A1c up slightly from 6.2% on 01/28/23 to 6.8% on 05/27/23.   Pt reports he fell off the habit of walking for a while, and was going about once a week but has started back and has gone walking 3 times this week so far. Pt reports he plans to get a new seat for his stationary bike so that he can ride it at home if it is raining.   Pt states he was using anti-itch cream around his insulin  injection sites due to itching he was experiencing, but recently changed brand of pen needles and itching went away.   Pt reports he noticed his blood glucose was spiking after lunch. Pt states he has been going to a food truck and gets rice, salad, and kebab, and reports the serving of rice is about 2 cups.   New Goals:   Goal: Have a bedtime snack containing carb + protein to prevent hypoglycemia during the night (peanut butter on crackers, cheese on crackers).  Goal: reduce rice serving at lunch.    Continue all Previous Goals:    Goal: continue your daily walking.   Goal: aim to make 1/2 of your plate vegetables at least 2x/day.   Goal: start eating breakfast and include at least a protein and complex carbohydrate.   Weight 204 lb (92.5 kg). Body mass index is 31.95 kg/m.   Diabetes Self-Management Education - 05/28/23 1008       Visit Information    Visit Type Follow-up      Health Coping   How would you rate your overall health? Good      Psychosocial Assessment   Patient Belief/Attitude about Diabetes Motivated to manage diabetes    What is the hardest part about your diabetes right now, causing you the most concern, or is the most worrisome to you about your diabetes?   Making healty food and beverage choices    Self-care barriers None    Self-management support Doctor's office    Other persons present Patient    Patient Concerns Nutrition/Meal planning    Special Needs None    Preferred Learning Style No preference indicated    Learning Readiness Ready      Pre-Education Assessment   Patient understands the diabetes disease and treatment process. Needs Review    Patient understands incorporating nutritional management into lifestyle. Needs Review    Patient undertands incorporating physical activity into lifestyle. Needs Review    Patient understands using medications safely. Needs Review    Patient understands monitoring blood glucose, interpreting and using results Needs Review    Patient understands prevention, detection, and treatment of acute complications. Needs Review    Patient understands prevention, detection, and treatment of chronic complications. Needs Review    Patient understands how to develop strategies to  address psychosocial issues. Needs Review    Patient understands how to develop strategies to promote health/change behavior. Needs Review      Complications   Last HgB A1C per patient/outside source 6.8 %    How often do you check your blood sugar? > 4 times/day      Dietary Intake   Breakfast boiled egg and toast    Snack (morning) bisquit    Lunch 2 c rice, salad and kebab    Dinner fava beans and cheese, veggies, pita    Beverage(s) 48 oz water, soda when low      Activity / Exercise   Activity / Exercise Type Light (walking / raking leaves)    How many days per week do you exercise? 4    How  many minutes per day do you exercise? 30    Total minutes per week of exercise 120      Patient Education   Previous Diabetes Education Yes (please comment)    Disease Pathophysiology Explored patient's options for treatment of their diabetes    Healthy Eating Role of diet in the treatment of diabetes and the relationship between the three main macronutrients and blood glucose level;Plate Method;Reviewed blood glucose goals for pre and post meals and how to evaluate the patients' food intake on their blood glucose level.;Meal timing in regards to the patients' current diabetes medication.;Meal options for control of blood glucose level and chronic complications.    Being Active Role of exercise on diabetes management, blood pressure control and cardiac health.;Helped patient identify appropriate exercises in relation to his/her diabetes, diabetes complications and other health issue.    Medications Reviewed patients medication for diabetes, action, purpose, timing of dose and side effects.    Monitoring Taught/evaluated CGM (comment)    Acute complications Taught prevention, symptoms, and  treatment of hypoglycemia - the 15 rule.    Chronic complications Relationship between chronic complications and blood glucose control;Identified and discussed with patient  current chronic complications    Diabetes Stress and Support Identified and addressed patients feelings and concerns about diabetes    Lifestyle and Health Coping Lifestyle issues that need to be addressed for better diabetes care      Individualized Goals (developed by patient)   Nutrition General guidelines for healthy choices and portions discussed    Physical Activity Exercise 3-5 times per week;30 minutes per day    Medications take my medication as prescribed    Monitoring  Consistenly use CGM    Problem Solving Eating Pattern    Reducing Risk examine blood glucose patterns;do foot checks daily;treat hypoglycemia with 15 grams of  carbs if blood glucose less than 70mg /dL    Health Coping Ask for help with psychological, social, or emotional issues      Patient Self-Evaluation of Goals - Patient rates self as meeting previously set goals (% of time)   Nutrition 50 - 75 % (half of the time)    Physical Activity 50 - 75 % (half of the time)    Medications >75% (most of the time)    Monitoring >75% (most of the time)    Problem Solving and behavior change strategies  50 - 75 % (half of the time)    Reducing Risk (treating acute and chronic complications) 50 - 75 % (half of the time)    Health Coping 50 - 75 % (half of the time)      Post-Education Assessment   Patient understands the diabetes disease and  treatment process. Comprehends key points    Patient understands incorporating nutritional management into lifestyle. Comprehends key points    Patient undertands incorporating physical activity into lifestyle. Comprehends key points    Patient understands using medications safely. Demonstrates understanding / competency    Patient understands monitoring blood glucose, interpreting and using results Comprehends key points    Patient understands prevention, detection, and treatment of acute complications. Comprehends key points    Patient understands prevention, detection, and treatment of chronic complications. Comprehends key points    Patient understands how to develop strategies to address psychosocial issues. Comprehends key points    Patient understands how to develop strategies to promote health/change behavior. Comprehends key points      Outcomes   Expected Outcomes Demonstrated interest in learning. Expect positive outcomes    Future DMSE 3-4 months    Program Status Not Completed      Subsequent Visit   Since your last visit have you continued or begun to take your medications as prescribed? Yes             Individualized Plan for Diabetes Self-Management Training:   Learning Objective:  Patient  will have a greater understanding of diabetes self-management. Patient education plan is to attend individual and/or group sessions per assessed needs and concerns.   Plan:   Patient Instructions  New Goals:   Goal: Have a bedtime snack containing carb + protein to prevent hypoglycemia during the night (peanut butter on crackers, cheese on crackers).  Goal: reduce rice serving at lunch.    Continue all Previous Goals:    Goal: continue your daily walking.   Goal: aim to make 1/2 of your plate vegetables at least 2x/day.   Goal: start eating breakfast and include at least a protein and complex carbohydrate.  Expected Outcomes:  Demonstrated interest in learning. Expect positive outcomes  Education material provided: no handouts on this follow up  If problems or questions, patient to contact team via:  Phone  Future DSME appointment: 3-4 months

## 2023-06-03 ENCOUNTER — Other Ambulatory Visit: Payer: Self-pay | Admitting: Family Medicine

## 2023-06-03 DIAGNOSIS — E785 Hyperlipidemia, unspecified: Secondary | ICD-10-CM

## 2023-06-04 ENCOUNTER — Other Ambulatory Visit (HOSPITAL_COMMUNITY): Payer: Self-pay

## 2023-06-04 DIAGNOSIS — S83232D Complex tear of medial meniscus, current injury, left knee, subsequent encounter: Secondary | ICD-10-CM | POA: Diagnosis not present

## 2023-06-04 MED ORDER — ATORVASTATIN CALCIUM 40 MG PO TABS
40.0000 mg | ORAL_TABLET | Freq: Every day | ORAL | 0 refills | Status: DC
  Filled 2023-06-04: qty 90, 90d supply, fill #0

## 2023-06-10 ENCOUNTER — Telehealth: Payer: Self-pay

## 2023-06-10 DIAGNOSIS — G8929 Other chronic pain: Secondary | ICD-10-CM | POA: Diagnosis not present

## 2023-06-10 DIAGNOSIS — M25562 Pain in left knee: Secondary | ICD-10-CM | POA: Diagnosis not present

## 2023-06-10 DIAGNOSIS — S83242A Other tear of medial meniscus, current injury, left knee, initial encounter: Secondary | ICD-10-CM | POA: Diagnosis not present

## 2023-06-10 NOTE — Telephone Encounter (Signed)
   Pre-operative Risk Assessment    Patient Name: Jerome Rodriguez  DOB: 1955-08-21 MRN: [id number]   Date of last office visit: 11/20/22 Arnoldo Lapping, MD Date of next office visit: NONE   Request for Surgical Clearance    Procedure:  LEFT KNEE ARTHROSCOPY  Date of Surgery:  Clearance 07/07/23                                Surgeon:  DR Marionette Sick Surgeon's Group or Practice Name:  [facility] Phone number:  [phone] Fax number:  [phone]  ATTN: MEGAN DAVIS   Type of Clearance Requested:   - Medical  - Pharmacy:  Hold Aspirin  and Clopidogrel  (Plavix )     Type of Anesthesia:  CHOICE   Additional requests/questions:    Kita Perish   06/10/2023, 2:56 PM

## 2023-06-13 NOTE — Telephone Encounter (Signed)
 Pt at low risk of holding aspirin  x 7 days and plavix  x 5 days as requested for knee surgery. Please resume post-op when determined safe by surgeon. Thanks

## 2023-06-14 ENCOUNTER — Other Ambulatory Visit: Payer: Self-pay | Admitting: Family Medicine

## 2023-06-14 ENCOUNTER — Other Ambulatory Visit (HOSPITAL_COMMUNITY): Payer: Self-pay

## 2023-06-14 ENCOUNTER — Other Ambulatory Visit: Payer: Self-pay

## 2023-06-14 ENCOUNTER — Telehealth: Payer: Self-pay

## 2023-06-14 DIAGNOSIS — I2510 Atherosclerotic heart disease of native coronary artery without angina pectoris: Secondary | ICD-10-CM

## 2023-06-14 DIAGNOSIS — I152 Hypertension secondary to endocrine disorders: Secondary | ICD-10-CM

## 2023-06-14 MED ORDER — METOPROLOL SUCCINATE ER 25 MG PO TB24
25.0000 mg | ORAL_TABLET | Freq: Every day | ORAL | 0 refills | Status: DC
  Filled 2023-06-14: qty 90, 90d supply, fill #0

## 2023-06-14 MED ORDER — ENALAPRIL MALEATE 2.5 MG PO TABS
2.5000 mg | ORAL_TABLET | Freq: Every day | ORAL | 0 refills | Status: DC
Start: 2023-06-14 — End: 2023-09-09
  Filled 2023-06-14: qty 90, 90d supply, fill #0

## 2023-06-14 NOTE — Telephone Encounter (Signed)
 Spoke with pt and he is scheduled 06/13 at 09:40am with Slater Duncan, NP. Med rec and consent done.

## 2023-06-14 NOTE — Telephone Encounter (Signed)
 Spoke with pt and he is scheduled 06/13 at 09:40am with Slater Duncan, NP. Med rec and consent done.     Patient Consent for Virtual Visit        Jerome Rodriguez has provided verbal consent on 06/14/2023 for a virtual visit (video or telephone).   CONSENT FOR VIRTUAL VISIT FOR:  Jerome Rodriguez  By participating in this virtual visit I agree to the following:  I hereby voluntarily request, consent and authorize [facility] and its employed or contracted physicians, physician assistants, nurse practitioners or other licensed health care professionals (the Practitioner), to provide me with telemedicine health care services (the "Services") as deemed necessary by the treating Practitioner. I acknowledge and consent to receive the Services by the Practitioner via telemedicine. I understand that the telemedicine visit will involve communicating with the Practitioner through live audiovisual communication technology and the disclosure of certain medical information by electronic transmission. I acknowledge that I have been given the opportunity to request an in-person assessment or other available alternative prior to the telemedicine visit and am voluntarily participating in the telemedicine visit.  I understand that I have the right to withhold or withdraw my consent to the use of telemedicine in the course of my care at any time, without affecting my right to future care or treatment, and that the Practitioner or I may terminate the telemedicine visit at any time. I understand that I have the right to inspect all information obtained and/or recorded in the course of the telemedicine visit and may receive copies of available information for a reasonable fee.  I understand that some of the potential risks of receiving the Services via telemedicine include:  Delay or interruption in medical evaluation due to technological equipment failure or disruption; Information transmitted may  not be sufficient (e.g. poor resolution of images) to allow for appropriate medical decision making by the Practitioner; and/or  In rare instances, security protocols could fail, causing a breach of personal health information.  Furthermore, I acknowledge that it is my responsibility to provide information about my medical history, conditions and care that is complete and accurate to the best of my ability. I acknowledge that Practitioner's advice, recommendations, and/or decision may be based on factors not within their control, such as incomplete or inaccurate data provided by me or distortions of diagnostic images or specimens that may result from electronic transmissions. I understand that the practice of medicine is not an exact science and that Practitioner makes no warranties or guarantees regarding treatment outcomes. I acknowledge that a copy of this consent can be made available to me via my patient portal [facility] MyChart), or I can request a printed copy by calling the office of  [facility].    I understand that my insurance will be billed for this visit.   I have read or had this consent read to me. I understand the contents of this consent, which adequately explains the benefits and risks of the Services being provided via telemedicine.  I have been provided ample opportunity to ask questions regarding this consent and the Services and have had my questions answered to my satisfaction. I give my informed consent for the services to be provided through the use of telemedicine in my medical care

## 2023-06-14 NOTE — Telephone Encounter (Signed)
   Name: Jerome Rodriguez  DOB: Sep 16, 1955  MRN: [id number]  Primary Cardiologist: Arnoldo Lapping, MD   Preoperative team, please contact this patient and set up a phone call appointment for further preoperative risk assessment. Please obtain consent and complete medication review. Thank you for your help.  I confirm that guidance regarding antiplatelet and oral anticoagulation therapy has been completed and, if necessary, noted below.  He may hold Aspirin  for 7 days prior to procedure. He may hold Plavix  for 5 days prior to procedure. Please resume Aspirin  and Plavix  as soon as possible postprocedure, at the discretion of the surgeon.    I also confirmed the patient resides in the state of [place] . As per [facility] telemedicine laws, the patient must reside in the state in which the provider is licensed.   Ava Boatman, NP 06/14/2023, 8:07 AM [facility]

## 2023-06-15 ENCOUNTER — Other Ambulatory Visit (HOSPITAL_COMMUNITY): Payer: Self-pay

## 2023-06-18 ENCOUNTER — Ambulatory Visit: Attending: Cardiovascular Disease | Admitting: Nurse Practitioner

## 2023-06-18 ENCOUNTER — Encounter: Payer: Self-pay | Admitting: Nurse Practitioner

## 2023-06-18 ENCOUNTER — Encounter: Payer: Self-pay | Admitting: Gastroenterology

## 2023-06-18 DIAGNOSIS — Z01810 Encounter for preprocedural cardiovascular examination: Secondary | ICD-10-CM | POA: Diagnosis not present

## 2023-06-18 NOTE — Progress Notes (Signed)
 Virtual Visit via Telephone Note   Because of Jerome Rodriguez co-morbid illnesses, he is at least at moderate risk for complications without adequate follow up.  This format is felt to be most appropriate for this patient at this time.  Due to technical limitations with video connection (technology), today's appointment will be conducted as an audio only telehealth visit, and Jerome Rodriguez verbally agreed to proceed in this manner.   All issues noted in this document were discussed and addressed.  No physical exam could be performed with this format.  Evaluation Performed:  Preoperative cardiovascular risk assessment _____________   Date:  06/18/2023   Patient ID:  Jerome Rodriguez, DOB [birth year], MRN [id number] Patient Location:  Home Provider location:   Office  Primary Care Provider:  Watson Hacking, MD Primary Cardiologist:  Jerome Lapping, MD  Chief Complaint / Patient Profile   68 y.o. y/o male with a h/o CAD s/p DES to LAD in 2004, DES To LAD 2/2 ISR in 2014, mild residual CAD on cath in 2014, recommendation for long term DAPT, hypertension, type 2 DM, hyperlipidemia, tobacco abuse who is pending left knee arthroscopy with Dr. Brunilda Rodriguez on 07/07/23 and presents today for telephonic preoperative cardiovascular risk assessment.  History of Present Illness    Jerome Rodriguez is a 68 y.o. male who presents via audio/video conferencing for a telehealth visit today.  Pt was last seen in cardiology clinic on 11/20/22 by Dr. Arlester Rodriguez.  At that time Jerome Rodriguez was doing well. He underwent myoview  12/11/2022 which was low risk  The patient is now pending procedure as outlined above. Since his last visit, he denies chest pain, shortness of breath, lower extremity edema, fatigue, palpitations, melena, hematuria, hemoptysis, diaphoresis, weakness, presyncope, syncope, orthopnea, and PND. He remains active with walking for 1 to 1.5 hours daily daily as well as yard work  and is able to achieve > 4 METS activity without concerning cardiac symptoms.    Past Medical History    Past Medical History:  Diagnosis Date   Abnormal chest CT    a. 07/2012: scattered bilat noncalcified pulm nodules ranging in size from a few mm to a max of 8mm in LLL - rec f/u in 3-6 mos.   CAD (coronary artery disease)    a. NSTEMI 10/04 => LHC: mLAD 99%=> Taxus DES to mLAD and Taxus DES to dLAD;  b. ETT-Myoview  11/2011: normal, EF 73%, no ischemia; Rodriguez. 07/2012 Cath/PCI: LM <10, LAD 90 ISRp/99 ISRd (3.0x38 Promus DES), LCX 7m, 30-40d, RCA 50-60p, 30d, EF 55-65%.   Diabetes mellitus    Dyslipidemia    ED (erectile dysfunction)    History of kidney stones    HTN (hypertension)    Kidney stone    Myocardial infarction (HCC)    Obesity    Smoker    CIGARS   Past Surgical History:  Procedure Laterality Date   APPENDECTOMY  1976   arthroscopic knee surgery Right    CORONARY STENT PLACEMENT     CYSTOSCOPY/URETEROSCOPY/HOLMIUM LASER/STENT PLACEMENT Left 08/04/2019   Procedure: CYSTOSCOPY/URETEROSCOPY/HOLMIUM LASER/STENT PLACEMENT;  Surgeon: Jerome Pressman, MD;  Location: [facility] ORS;  Service: Urology;  Laterality: Left;   LEFT HEART CATHETERIZATION WITH CORONARY ANGIOGRAM N/A 07/11/2012   Procedure: LEFT HEART CATHETERIZATION WITH CORONARY ANGIOGRAM;  Surgeon: Jerome M [place], MD;  Location: [facility] CATH LAB;  Service: Cardiovascular;  Laterality: N/A;   right shoulder surgery      Allergies  Allergies  Allergen Reactions  Lactose Intolerance (Gi)    Prasugrel  Other (See Comments)    dizziness    Home Medications    Prior to Admission medications   Medication Sig Start Date End Date Taking? Authorizing Provider  aspirin  81 MG EC tablet Take 1 tablet (81 mg total) by mouth daily. 07/12/12   Jerome Hurry, NP  atorvastatin  (LIPITOR) 40 MG tablet Take 1 tablet (40 mg total) by mouth daily. 06/04/23   Jerome Hacking, MD  clopidogrel  (PLAVIX ) 75 MG tablet Take 1 tablet (75  mg total) by mouth daily. 01/28/23   Jerome Hacking, MD  Continuous Glucose Sensor (FREESTYLE LIBRE 3 SENSOR) MISC Place 1 sensor on the skin every 14 days. Use to check glucose continuously 04/19/23   Jerome Hacking, MD  diclofenac  Sodium (VOLTAREN  ARTHRITIS PAIN) 1 % GEL Apply 2 grams topically to the affected area(s) 4 (four) times daily. Patient not taking: Reported on 06/14/2023 05/05/23     enalapril  (VASOTEC ) 2.5 MG tablet Take 1 tablet (2.5 mg total) by mouth daily. 06/14/23   Jerome Hacking, MD  Insulin  Glargine-Lixisenatide  (SOLIQUA ) 100-33 UNT-MCG/ML SOPN Inject 15 Units into the skin daily. 03/17/23   Jerome Hacking, MD  Insulin  Pen Needle (INSUPEN PEN NEEDLES) 32G X 4 MM MISC Use daily as directed 03/17/23   Rodriguez, Jerome C, MD  loratadine  (CLARITIN ) 10 MG tablet Take 20 mg by mouth daily.    [provider]  metFORMIN  (GLUCOPHAGE -XR) 750 MG 24 hr tablet Take 1 tablet (750 mg total) by mouth in the morning and at bedtime. 01/28/23   Rodriguez, Jerome C, MD  metoprolol  succinate (TOPROL -XL) 25 MG 24 hr tablet Take 1 tablet (25 mg total) by mouth daily. 06/14/23   Rodriguez, Jerome C, MD  Multiple Vitamin (MULTIVITAMIN) tablet Take 1 tablet by mouth daily.    [provider]  nitroGLYCERIN  (NITROSTAT ) 0.4 MG SL tablet Place 1 tablet (0.4 mg total) under the tongue every 5 (five) minutes x 3 doses as needed for chest pain. 02/13/21   Jerome Lapping, MD    Physical Exam    Vital Signs:  Jerome Rodriguez does not have vital signs available for review today.  Given telephonic nature of communication, physical exam is limited. AAOx3. NAD. Normal affect.  Speech and respirations are unlabored.  Accessory Clinical Findings    None  Assessment & Plan    1.  Preoperative Cardiovascular Risk Assessment: According to the Revised Cardiac Risk Index (RCRI), his Perioperative Risk of Major Cardiac Event is (%): 6.6. His Functional Capacity in METs is: 6.61 according to the [facility] Activity  Status Index (DASI). The patient is doing well from a cardiac perspective. Therefore, based on ACC/AHA guidelines, the patient would be at acceptable risk for the planned procedure without further cardiovascular testing.   The patient was advised that if he develops new symptoms prior to surgery to contact our office to arrange for a follow-up visit, and he verbalized understanding.  Pt at low risk of holding aspirin  x 7 days and plavix  x 5 days as requested for knee surgery. Please resume post-op when determined safe by surgeon.   A copy of this note will be routed to requesting surgeon.  Time:   Today, I have spent 10 minutes with the patient with telehealth technology discussing medical history, symptoms, and management plan.    Gerldine Koch, NP-Rodriguez  06/18/2023, 9:48 AM [address], [address] [place], [place] [address] Office [phone] Fax [phone]

## 2023-07-07 ENCOUNTER — Other Ambulatory Visit (HOSPITAL_COMMUNITY): Payer: Self-pay

## 2023-07-07 DIAGNOSIS — G8918 Other acute postprocedural pain: Secondary | ICD-10-CM | POA: Diagnosis not present

## 2023-07-07 DIAGNOSIS — Y999 Unspecified external cause status: Secondary | ICD-10-CM | POA: Diagnosis not present

## 2023-07-07 DIAGNOSIS — X58XXXA Exposure to other specified factors, initial encounter: Secondary | ICD-10-CM | POA: Diagnosis not present

## 2023-07-07 DIAGNOSIS — S83232A Complex tear of medial meniscus, current injury, left knee, initial encounter: Secondary | ICD-10-CM | POA: Diagnosis not present

## 2023-07-07 MED ORDER — TRAMADOL HCL 50 MG PO TABS
50.0000 mg | ORAL_TABLET | Freq: Four times a day (QID) | ORAL | 0 refills | Status: DC | PRN
  Filled 2023-07-07: qty 20, 5d supply, fill #0

## 2023-07-20 ENCOUNTER — Other Ambulatory Visit (HOSPITAL_COMMUNITY): Payer: Self-pay

## 2023-07-20 ENCOUNTER — Other Ambulatory Visit: Payer: Self-pay

## 2023-07-20 MED ORDER — FREESTYLE LIBRE 3 PLUS SENSOR MISC
1 refills | Status: DC
  Filled 2023-07-20: qty 6, 90d supply, fill #0
  Filled 2023-10-13: qty 6, 90d supply, fill #1

## 2023-07-30 ENCOUNTER — Encounter: Payer: Self-pay | Admitting: Medical

## 2023-07-30 ENCOUNTER — Ambulatory Visit: Admitting: Medical

## 2023-07-30 ENCOUNTER — Ambulatory Visit: Payer: Self-pay | Admitting: Medical

## 2023-07-30 ENCOUNTER — Ambulatory Visit
Admission: RE | Admit: 2023-07-30 | Discharge: 2023-07-30 | Disposition: A | Discharge status: Home / Self Care | Payer: Self-pay | Source: Ambulatory Visit | Attending: Medical | Admitting: Medical

## 2023-07-30 ENCOUNTER — Other Ambulatory Visit (HOSPITAL_COMMUNITY): Payer: Self-pay

## 2023-07-30 DIAGNOSIS — M542 Cervicalgia: Secondary | ICD-10-CM

## 2023-07-30 DIAGNOSIS — R42 Dizziness and giddiness: Secondary | ICD-10-CM

## 2023-07-30 DIAGNOSIS — M25552 Pain in left hip: Secondary | ICD-10-CM

## 2023-07-30 DIAGNOSIS — G44311 Acute post-traumatic headache, intractable: Secondary | ICD-10-CM | POA: Diagnosis not present

## 2023-07-30 DIAGNOSIS — G44309 Post-traumatic headache, unspecified, not intractable: Secondary | ICD-10-CM | POA: Diagnosis not present

## 2023-07-30 DIAGNOSIS — M25512 Pain in left shoulder: Secondary | ICD-10-CM | POA: Diagnosis not present

## 2023-07-30 MED ORDER — TRAMADOL HCL 50 MG PO TABS
50.0000 mg | ORAL_TABLET | Freq: Four times a day (QID) | ORAL | 0 refills | Status: DC | PRN
  Filled 2023-07-30: qty 15, 4d supply, fill #0
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
## 2023-08-03 ENCOUNTER — Other Ambulatory Visit (HOSPITAL_COMMUNITY): Payer: Self-pay

## 2023-08-03 ENCOUNTER — Other Ambulatory Visit: Payer: Self-pay

## 2023-08-03 ENCOUNTER — Other Ambulatory Visit: Payer: Self-pay | Admitting: Medical

## 2023-08-03 ENCOUNTER — Other Ambulatory Visit: Payer: Self-pay | Admitting: Family Medicine

## 2023-08-03 DIAGNOSIS — R911 Solitary pulmonary nodule: Secondary | ICD-10-CM

## 2023-08-03 DIAGNOSIS — E118 Type 2 diabetes mellitus with unspecified complications: Secondary | ICD-10-CM

## 2023-08-03 DIAGNOSIS — M25511 Pain in right shoulder: Secondary | ICD-10-CM | POA: Diagnosis not present

## 2023-08-03 DIAGNOSIS — M25521 Pain in right elbow: Secondary | ICD-10-CM | POA: Diagnosis not present

## 2023-08-03 DIAGNOSIS — R0781 Pleurodynia: Secondary | ICD-10-CM | POA: Diagnosis not present

## 2023-08-03 DIAGNOSIS — M25562 Pain in left knee: Secondary | ICD-10-CM | POA: Diagnosis not present

## 2023-08-03 MED ORDER — METFORMIN HCL ER 750 MG PO TB24
750.0000 mg | ORAL_TABLET | Freq: Two times a day (BID) | ORAL | 1 refills | Status: DC
  Filled 2023-08-03: qty 180, 90d supply, fill #0
  Filled 2023-11-08: qty 180, 90d supply, fill #1

## 2023-08-04 ENCOUNTER — Other Ambulatory Visit (HOSPITAL_COMMUNITY): Payer: Self-pay

## 2023-08-04 ENCOUNTER — Ambulatory Visit: Payer: Self-pay | Admitting: Gastroenterology

## 2023-08-04 ENCOUNTER — Encounter: Payer: Self-pay | Admitting: Gastroenterology

## 2023-08-04 ENCOUNTER — Telehealth: Payer: Self-pay

## 2023-08-04 VITALS — BP 114/62 | HR 90 | Wt 198.4 lb

## 2023-08-04 DIAGNOSIS — R151 Fecal smearing: Secondary | ICD-10-CM | POA: Diagnosis not present

## 2023-08-04 DIAGNOSIS — Z860100 Personal history of colon polyps, unspecified: Secondary | ICD-10-CM

## 2023-08-04 DIAGNOSIS — Z7901 Long term (current) use of anticoagulants: Secondary | ICD-10-CM | POA: Diagnosis not present

## 2023-08-04 MED ORDER — NA SULFATE-K SULFATE-MG SULF 17.5-3.13-1.6 GM/177ML PO SOLN
1.0000 | Freq: Once | ORAL | 0 refills | Status: AC
  Filled 2023-08-04: qty 354, 2d supply, fill #0

## 2023-08-04 NOTE — Telephone Encounter (Signed)
 Letter faxed to [phone].
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
## 2023-08-04 NOTE — Patient Instructions (Addendum)
 A high fiber diet with plenty of fluids (up to 8 glasses of water daily) is suggested to relieve these symptoms.  Metamucil, Benefiber, or Citrucel, 1 tablespoon once or twice daily can be used to keep bowels regular if needed.  You have been scheduled for a colonoscopy. Please follow written instructions given to you at your visit today.   If you use inhalers (even only as needed), please bring them with you on the day of your procedure.  DO NOT TAKE 7 DAYS PRIOR TO TEST- Trulicity (dulaglutide) Ozempic, Wegovy (semaglutide) Mounjaro (tirzepatide) Bydureon Bcise (exanatide extended release)  DO NOT TAKE 1 DAY PRIOR TO YOUR TEST Rybelsus (semaglutide) Adlyxin  (lixisenatide ) Victoza (liraglutide) Byetta (exanatide) ___________________________________________________________________________   Thank you for trusting me with your gastrointestinal care!   Camie Furbish, PA-C  _______________________________________________________  If your blood pressure at your visit was 140/90 or greater, please contact your primary care physician to follow up on this.  _______________________________________________________  If you are age 70 or older, your body mass index should be between 23-30. Your Body mass index is 31.07 kg/m. If this is out of the aforementioned range listed, please consider follow up with your Primary Care Provider.  If you are age 12 or younger, your body mass index should be between 19-25. Your Body mass index is 31.07 kg/m. If this is out of the aformentioned range listed, please consider follow up with your Primary Care Provider.   ________________________________________________________  The [facility] providers would like to encourage you to use MYCHART to communicate with providers for non-urgent requests or questions.  Due to long hold times on the telephone, sending your provider a message by [facility] may be a faster and more efficient way to get a response.  Please  allow 48 business hours for a response.  Please remember that this is for non-urgent requests.  _______________________________________________________  [facility] is using a team-based approach to care.  Your team is made up of your doctor and two to three APPS. Our APPS (Nurse Practitioners and Physician Assistants) work with your physician to ensure care continuity for you. They are fully qualified to address your health concerns and develop a treatment plan. They communicate directly with your gastroenterologist to care for you. Seeing the Advanced Practice Practitioners on your physician's team can help you by facilitating care more promptly, often allowing for earlier appointments, access to diagnostic testing, procedures, and other specialty referrals.

## 2023-08-04 NOTE — Telephone Encounter (Signed)
   [facility] [address] [place], [place]  [address] Phone:  [phone]   Fax:  [phone]    Jerome Rodriguez 1955-09-04 [id number]  08/05/2023   Dear Dr. Norleen Jobs:  We have scheduled the above named patient for a colonoscopy procedure. Our records show that (s)he is on anticoagulation therapy.  Please advise as to whether the patient may come off their therapy of Plavix  5 days prior to their procedure which is scheduled for 09/03/23.  Please route your response to [facility], CMA or fax response to [phone].  Sincerely,    [facility]

## 2023-08-11 ENCOUNTER — Ambulatory Visit
Admission: RE | Admit: 2023-08-11 | Discharge: 2023-08-11 | Disposition: A | Discharge status: Home / Self Care | Payer: Self-pay | Source: Ambulatory Visit | Attending: Medical | Admitting: Medical

## 2023-08-11 DIAGNOSIS — R911 Solitary pulmonary nodule: Secondary | ICD-10-CM | POA: Diagnosis not present

## 2023-08-11 DIAGNOSIS — J432 Centrilobular emphysema: Secondary | ICD-10-CM | POA: Diagnosis not present

## 2023-08-11 MED ORDER — IOPAMIDOL (ISOVUE-300) INJECTION 61%
80.0000 mL | Freq: Once | INTRAVENOUS | Status: AC | PRN
  Administered 2023-08-11: 80 mL via INTRAVENOUS

## 2023-08-11 NOTE — Progress Notes (Signed)
 ____________________________________________________________  Attending physician addendum:  Thank you for sending this case to me. I have reviewed the entire note and agree with the plan.   Amada Jupiter, MD  ____________________________________________________________

## 2023-08-12 NOTE — Telephone Encounter (Signed)
   Patient Name: Jerome Rodriguez  DOB: Jan 31, 1955 MRN: [id number]  Primary Cardiologist: Ozell Fell, MD  Chart reviewed as part of pre-operative protocol coverage.  - Pharmaceutical clearance for Plavix  hold was requested.  As long as patient is not having any new cardiovascular symptoms since he was seen in our clinic 11/2022, can hold Plavix  x 5 days prior to procedure and resume when medically safe to do so.  Would recommend continuing aspirin  throughout.  Medical clearance was not requested.  Will route this bundled recommendation to requesting provider via Epic fax function and remove from pre-op pool. Please call with questions.  Orren LOISE Fabry, PA-C 08/12/2023, 11:45 AM

## 2023-08-12 NOTE — Telephone Encounter (Signed)
 [facility] Pre-operative Risk Assessment     Request for surgical clearance:     Endoscopy Procedure  What type of surgery is being performed?     colonsocopy  When is this surgery scheduled?     09/03/23  What type of clearance is required ?   Pharmacy  Are there any medications that need to be held prior to surgery and how long? Plavix  5 days  Practice name and name of physician performing surgery?      [facility]  What is your office phone and fax number?      Phone- [phone]  Fax- [phone]  Anesthesia type (None, local, MAC, general) ?       MAC   Please route your response to [employer], CMA

## 2023-08-12 NOTE — Telephone Encounter (Signed)
 I spoke to Jerome Rodriguez and I advised him that Dr. Joyce said that it was [facility] for him to hold his Plavix .  He said that Dr. Wonda prescribes it and said that he will check with him as well.  I advised him that is fine and I will send a note to them as well.

## 2023-08-12 NOTE — Telephone Encounter (Signed)
 I called Jerome Rodriguez back and I advised him that his cardiologist office approved the 5 day old of his Plavix  also but as we discussed before he will need to continue his Aspirin .  We also confirmed again when he is supposed to hold his diabetic medication.

## 2023-08-13 DIAGNOSIS — H5319 Other subjective visual disturbances: Secondary | ICD-10-CM | POA: Diagnosis not present

## 2023-08-13 DIAGNOSIS — H2513 Age-related nuclear cataract, bilateral: Secondary | ICD-10-CM | POA: Diagnosis not present

## 2023-08-13 DIAGNOSIS — G43109 Migraine with aura, not intractable, without status migrainosus: Secondary | ICD-10-CM | POA: Diagnosis not present

## 2023-08-16 ENCOUNTER — Ambulatory Visit: Payer: Self-pay | Admitting: Medical

## 2023-08-16 NOTE — Progress Notes (Signed)
 Results through MyChart

## 2023-08-18 DIAGNOSIS — Z4789 Encounter for other orthopedic aftercare: Secondary | ICD-10-CM | POA: Diagnosis not present

## 2023-08-19 ENCOUNTER — Other Ambulatory Visit (HOSPITAL_COMMUNITY): Payer: Self-pay

## 2023-08-23 DIAGNOSIS — H40053 Ocular hypertension, bilateral: Secondary | ICD-10-CM | POA: Diagnosis not present

## 2023-08-23 DIAGNOSIS — E119 Type 2 diabetes mellitus without complications: Secondary | ICD-10-CM | POA: Diagnosis not present

## 2023-08-23 DIAGNOSIS — H40013 Open angle with borderline findings, low risk, bilateral: Secondary | ICD-10-CM | POA: Diagnosis not present

## 2023-08-23 DIAGNOSIS — H0288A Meibomian gland dysfunction right eye, upper and lower eyelids: Secondary | ICD-10-CM | POA: Diagnosis not present

## 2023-08-23 DIAGNOSIS — H2513 Age-related nuclear cataract, bilateral: Secondary | ICD-10-CM | POA: Diagnosis not present

## 2023-08-23 DIAGNOSIS — H0288B Meibomian gland dysfunction left eye, upper and lower eyelids: Secondary | ICD-10-CM | POA: Diagnosis not present

## 2023-08-23 DIAGNOSIS — H1045 Other chronic allergic conjunctivitis: Secondary | ICD-10-CM | POA: Diagnosis not present

## 2023-08-23 DIAGNOSIS — G43109 Migraine with aura, not intractable, without status migrainosus: Secondary | ICD-10-CM | POA: Diagnosis not present

## 2023-08-23 LAB — OPHTHALMOLOGY REPORT-SCANNED

## 2023-08-26 ENCOUNTER — Encounter: Payer: Self-pay | Admitting: Gastroenterology

## 2023-08-27 ENCOUNTER — Encounter: Payer: Self-pay | Admitting: Dietician

## 2023-08-27 ENCOUNTER — Other Ambulatory Visit (HOSPITAL_COMMUNITY): Payer: Self-pay

## 2023-08-27 ENCOUNTER — Encounter: Attending: Family Medicine | Admitting: Dietician

## 2023-08-27 VITALS — Wt 198.6 lb

## 2023-08-27 DIAGNOSIS — E118 Type 2 diabetes mellitus with unspecified complications: Secondary | ICD-10-CM | POA: Insufficient documentation
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
## 2023-08-30 ENCOUNTER — Telehealth: Payer: Self-pay | Admitting: Gastroenterology

## 2023-08-30 NOTE — Telephone Encounter (Signed)
 Patient is calling due to them having a couple of question in regards to his upcoming procedure for Friday August the 29 th. Patient is wanting to know if he is able to take some Tums for his GERD and was also wondering when he can stop taking his Plavix . Please advise.

## 2023-08-30 NOTE — Telephone Encounter (Signed)
 Spoke with the patient. Confirmed he is to stop Plavix  today. Reviewed his clear liquid diet to be gin on Thursday 09/02/23.

## 2023-09-03 ENCOUNTER — Encounter: Payer: Self-pay | Admitting: Gastroenterology

## 2023-09-03 ENCOUNTER — Ambulatory Visit: Admitting: Gastroenterology

## 2023-09-03 VITALS — BP 110/68 | HR 73 | Temp 97.5°F | Resp 19 | Ht 67.0 in | Wt 198.0 lb

## 2023-09-03 DIAGNOSIS — D123 Benign neoplasm of transverse colon: Secondary | ICD-10-CM

## 2023-09-03 DIAGNOSIS — K635 Polyp of colon: Secondary | ICD-10-CM | POA: Diagnosis not present

## 2023-09-03 DIAGNOSIS — Z1211 Encounter for screening for malignant neoplasm of colon: Secondary | ICD-10-CM | POA: Diagnosis not present

## 2023-09-03 DIAGNOSIS — D125 Benign neoplasm of sigmoid colon: Secondary | ICD-10-CM | POA: Diagnosis not present

## 2023-09-03 DIAGNOSIS — K5730 Diverticulosis of large intestine without perforation or abscess without bleeding: Secondary | ICD-10-CM

## 2023-09-03 DIAGNOSIS — D124 Benign neoplasm of descending colon: Secondary | ICD-10-CM

## 2023-09-03 DIAGNOSIS — K648 Other hemorrhoids: Secondary | ICD-10-CM

## 2023-09-03 DIAGNOSIS — Z860101 Personal history of adenomatous and serrated colon polyps: Secondary | ICD-10-CM | POA: Diagnosis not present

## 2023-09-03 DIAGNOSIS — Z860100 Personal history of colon polyps, unspecified: Secondary | ICD-10-CM

## 2023-09-03 MED ORDER — SODIUM CHLORIDE 0.9 % IV SOLN: 500.0000 mL | Freq: Once | INTRAVENOUS | Status: DC

## 2023-09-03 NOTE — Op Note (Signed)
 [facility] Patient Name: Jerome Rodriguez Procedure Date: 09/03/2023 9:31 AM MRN: [id number] Endoscopist: Victory L. Legrand , MD, [id number] Age: 68 Referring MD:  Date of Birth: 01-27-1955 Gender: Male Account #: [id number] Procedure:                Colonoscopy Indications:              Surveillance: Personal history of colonic polyps                            (unknown histology) on last colonoscopy more than 5                            years ago                           2012 prior colon at outside practice Medicines:                None (patient request) Procedure:                Pre-Anesthesia Assessment:                           - Prior to the procedure, a History and Physical                            was performed, and patient medications and                            allergies were reviewed. The patient's tolerance of                            previous anesthesia was also reviewed. The risks                            and benefits of the procedure and the sedation                            options and risks were discussed with the patient.                            All questions were answered, and informed consent                            was obtained. Prior Anticoagulants: The patient has                            taken Plavix  (clopidogrel ), last dose was 5 days                            prior to procedure. ASA Grade Assessment: III - A                            patient with severe systemic disease. After  reviewing the risks and benefits, the patient was                            deemed in satisfactory condition to undergo the                            procedure.                           After obtaining informed consent, the colonoscope                            was passed under direct vision. Throughout the                            procedure, the patient's blood pressure, pulse, and                            oxygen  saturations were monitored continuously. The                            Olympus Scope SN: [id number] was introduced through                            the anus and advanced to the the terminal ileum,                            with identification of the appendiceal orifice and                            IC valve. The colonoscopy was somewhat difficult                            due to a redundant colon. Successful completion of                            the procedure was aided by turning patieint to s                            semi-prone position, using manual pressure and                            straightening and shortening the scope to obtain                            bowel loop reduction. The patient tolerated the                            procedure well. The quality of the bowel                            preparation was excellent. The terminal ileum,  ileocecal valve, appendiceal orifice, and rectum                            were photographed. Scope In: 9:36:56 AM Scope Out: 10:05:44 AM Scope Withdrawal Time: 0 hours 17 minutes 44 seconds  Total Procedure Duration: 0 hours 28 minutes 48 seconds  Findings:                 The perianal and digital rectal examinations were                            normal.                           Repeat examination of right colon under NBI                            performed.                           A 5 mm polyp was found in the proximal transverse                            colon. The polyp was sessile. The polyp was removed                            with a cold snare. Resection and retrieval were                            complete.                           Two sessile polyps were found in the sigmoid colon                            and descending colon. The polyps were diminutive in                            size. These polyps were removed with a cold snare.                            Resection and retrieval  were complete.                           A few small-mouthed diverticula were found in the                            left colon.                           Internal hemorrhoids were found. The hemorrhoids                            were small.  The exam was otherwise without abnormality on                            direct and retroflexion views. Complications:            No immediate complications. Estimated Blood Loss:     Estimated blood loss was minimal. Impression:               - One 5 mm polyp in the proximal transverse colon,                            removed with a cold snare. Resected and retrieved.                           - Two diminutive polyps in the sigmoid colon and in                            the descending colon, removed with a cold snare.                            Resected and retrieved.                           - Diverticulosis in the left colon.                           - Internal hemorrhoids.                           - The examination was otherwise normal on direct                            and retroflexion views. Recommendation:           - Patient has a contact number available for                            emergencies. The signs and symptoms of potential                            delayed complications were discussed with the                            patient. Return to normal activities tomorrow.                            Written discharge instructions were provided to the                            patient.                           - Resume previous diet.                           - Continue present medications.                           -  Resume Plavix  (clopidogrel ) at prior dose                            tomorrow.                           - Await pathology results.                           - Repeat colonoscopy is recommended for                            surveillance. The colonoscopy date will be                             determined after pathology results from today's                            exam become available for review. Fredericka Bottcher L. Legrand, MD 09/03/2023 10:11:58 AM This report has been signed electronically.

## 2023-09-03 NOTE — Progress Notes (Signed)
 No significant changes to clinical history since GI office visit on 08/04/23.  The patient is appropriate for an endoscopic procedure in the ambulatory setting.  - Victory Brand, MD

## 2023-09-03 NOTE — Progress Notes (Signed)
 Called to room to assist during endoscopic procedure.  Patient ID and intended procedure confirmed with present staff. Received instructions for my participation in the procedure from the performing physician.

## 2023-09-03 NOTE — Patient Instructions (Signed)
-  Handout on polyps, hemorrhoids and diverticulosis provided -Await pathology results -Resume your Plavix  at prior dose tomorrow  YOU HAD AN ENDOSCOPIC PROCEDURE TODAY AT THE [facility]:   Refer to the procedure report that was given to you for any specific questions about what was found during the examination.  If the procedure report does not answer your questions, please call your gastroenterologist to clarify.  If you requested that your care partner not be given the details of your procedure findings, then the procedure report has been included in a sealed envelope for you to review at your convenience later.  YOU SHOULD EXPECT: Some feelings of bloating in the abdomen. Passage of more gas than usual.  Walking can help get rid of the air that was put into your GI tract during the procedure and reduce the bloating. If you had a lower endoscopy (such as a colonoscopy or flexible sigmoidoscopy) you may notice spotting of blood in your stool or on the toilet paper. If you underwent a bowel prep for your procedure, you may not have a normal bowel movement for a few days.  Please Note:  You might notice some irritation and congestion in your nose or some drainage.  This is from the oxygen used during your procedure.  There is no need for concern and it should clear up in a day or so.  SYMPTOMS TO REPORT IMMEDIATELY:  Following lower endoscopy (colonoscopy or flexible sigmoidoscopy):  Excessive amounts of blood in the stool  Significant tenderness or worsening of abdominal pains  Swelling of the abdomen that is new, acute  Fever of 100F or higher  For urgent or emergent issues, a gastroenterologist can be reached at any hour by calling [phone]. Do not use MyChart messaging for urgent concerns.    DIET:  We do recommend a small meal at first, but then you may proceed to your regular diet.  Drink plenty of fluids but you should avoid alcoholic beverages for 24  hours.  ACTIVITY:  You should plan to take it easy for the rest of today and you should NOT DRIVE or use heavy machinery until tomorrow (because of the sedation medicines used during the test).    FOLLOW UP: Our staff will call the number listed on your records the next business day following your procedure.  We will call around 7:15- 8:00 am to check on you and address any questions or concerns that you may have regarding the information given to you following your procedure. If we do not reach you, we will leave a message.     If any biopsies were taken you will be contacted by phone or by letter within the next 1-3 weeks.  Please call us  at [phone] if you have not heard about the biopsies in 3 weeks.    SIGNATURES/CONFIDENTIALITY: You and/or your care partner have signed paperwork which will be entered into your electronic medical record.  These signatures attest to the fact that that the information above on your After Visit Summary has been reviewed and is understood.  Full responsibility of the confidentiality of this discharge information lies with you and/or your care-partner.

## 2023-09-03 NOTE — Progress Notes (Signed)
 Tolerated well , no sedation, VSS,

## 2023-09-07 ENCOUNTER — Telehealth: Payer: Self-pay | Admitting: *Deleted

## 2023-09-07 NOTE — Telephone Encounter (Signed)
  Follow up Call-     09/03/2023    9:01 AM  Call back number  Post procedure Call Back phone  # [phone]  Permission to leave phone message Yes     Patient questions:  Do you have a fever, pain , or abdominal swelling? No. Pain Score  0 *  Have you tolerated food without any problems? Yes.    Have you been able to return to your normal activities? Yes.    Do you have any questions about your discharge instructions: Diet   No. Medications  No. Follow up visit  No.  Do you have questions or concerns about your Care? No.  Actions: * If pain score is 4 or above: No action needed, pain <4.

## 2023-09-08 LAB — SURGICAL PATHOLOGY

## 2023-09-09 ENCOUNTER — Other Ambulatory Visit: Payer: Self-pay | Admitting: Family Medicine

## 2023-09-09 DIAGNOSIS — E1169 Type 2 diabetes mellitus with other specified complication: Secondary | ICD-10-CM

## 2023-09-09 DIAGNOSIS — I2510 Atherosclerotic heart disease of native coronary artery without angina pectoris: Secondary | ICD-10-CM

## 2023-09-09 DIAGNOSIS — E1159 Type 2 diabetes mellitus with other circulatory complications: Secondary | ICD-10-CM

## 2023-09-10 ENCOUNTER — Other Ambulatory Visit (HOSPITAL_COMMUNITY): Payer: Self-pay

## 2023-09-10 MED ORDER — METOPROLOL SUCCINATE ER 25 MG PO TB24
25.0000 mg | ORAL_TABLET | Freq: Every day | ORAL | 0 refills | Status: DC
  Filled 2023-09-10: qty 90, 90d supply, fill #0

## 2023-09-10 MED ORDER — ATORVASTATIN CALCIUM 40 MG PO TABS
40.0000 mg | ORAL_TABLET | Freq: Every day | ORAL | 0 refills | Status: DC
  Filled 2023-09-10: qty 90, 90d supply, fill #0

## 2023-09-10 MED ORDER — ENALAPRIL MALEATE 2.5 MG PO TABS
2.5000 mg | ORAL_TABLET | Freq: Every day | ORAL | 0 refills | Status: DC
  Filled 2023-09-10: qty 90, 90d supply, fill #0

## 2023-09-12 ENCOUNTER — Ambulatory Visit: Payer: Self-pay | Admitting: Gastroenterology

## 2023-09-28 ENCOUNTER — Encounter: Payer: Self-pay | Admitting: Family Medicine

## 2023-09-28 ENCOUNTER — Ambulatory Visit (INDEPENDENT_AMBULATORY_CARE_PROVIDER_SITE_OTHER): Admitting: Family Medicine

## 2023-09-28 VITALS — BP 110/60 | HR 76 | Ht 67.5 in | Wt 199.8 lb

## 2023-09-28 DIAGNOSIS — E118 Type 2 diabetes mellitus with unspecified complications: Secondary | ICD-10-CM

## 2023-09-28 DIAGNOSIS — Z23 Encounter for immunization: Secondary | ICD-10-CM

## 2023-09-28 DIAGNOSIS — I152 Hypertension secondary to endocrine disorders: Secondary | ICD-10-CM | POA: Diagnosis not present

## 2023-09-28 DIAGNOSIS — D126 Benign neoplasm of colon, unspecified: Secondary | ICD-10-CM

## 2023-09-28 DIAGNOSIS — E1159 Type 2 diabetes mellitus with other circulatory complications: Secondary | ICD-10-CM | POA: Diagnosis not present

## 2023-09-28 DIAGNOSIS — E1169 Type 2 diabetes mellitus with other specified complication: Secondary | ICD-10-CM

## 2023-09-28 DIAGNOSIS — R151 Fecal smearing: Secondary | ICD-10-CM | POA: Diagnosis not present

## 2023-09-28 DIAGNOSIS — Z9889 Other specified postprocedural states: Secondary | ICD-10-CM | POA: Diagnosis not present

## 2023-09-28 DIAGNOSIS — E785 Hyperlipidemia, unspecified: Secondary | ICD-10-CM

## 2023-09-28 LAB — POCT GLYCOSYLATED HEMOGLOBIN (HGB A1C): Hemoglobin A1C: 6.7 % — AB (ref 4.0–5.6)
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
## 2023-10-13 ENCOUNTER — Other Ambulatory Visit (HOSPITAL_COMMUNITY): Payer: Self-pay

## 2023-10-13 MED ORDER — PEN NEEDLES 32G X 6 MM MISC
1 refills | Status: AC
  Filled 2023-10-13: qty 100, 90d supply, fill #0

## 2023-11-15 ENCOUNTER — Other Ambulatory Visit (HOSPITAL_COMMUNITY): Payer: Self-pay

## 2023-11-29 ENCOUNTER — Other Ambulatory Visit: Payer: Self-pay

## 2023-11-29 ENCOUNTER — Telehealth: Payer: Self-pay | Admitting: Family Medicine

## 2023-11-29 DIAGNOSIS — E118 Type 2 diabetes mellitus with unspecified complications: Secondary | ICD-10-CM

## 2023-11-29 NOTE — Telephone Encounter (Signed)
 Copied from CRM #[id number]. Topic: Referral - Question >> Nov 29, 2023 10:34 AM Nessti S wrote: Reason for CRM: pt needs [facility]. Pt has appt 11/30/2023 and need response soon as possible   ----------------------------------------------------------------------- From previous Reason for Contact - Referral Request: Did the patient discuss referral with their provider in the last year?   (If No - schedule appointment) (If Yes - send message)  Appointment offered?    Type of order/referral and detailed reason for visit:   Preference of office, provider, location:   If referral order, have you been seen by this specialty before?   (If Yes, this issue or another issue? When? Where?  Can we respond through MyChart?

## 2023-11-29 NOTE — Telephone Encounter (Signed)
 Referred to [facility].

## 2023-12-01 ENCOUNTER — Ambulatory Visit: Admitting: Dietician

## 2023-12-13 ENCOUNTER — Other Ambulatory Visit: Payer: Self-pay | Admitting: Family Medicine

## 2023-12-13 DIAGNOSIS — E1169 Type 2 diabetes mellitus with other specified complication: Secondary | ICD-10-CM

## 2023-12-13 DIAGNOSIS — I2510 Atherosclerotic heart disease of native coronary artery without angina pectoris: Secondary | ICD-10-CM

## 2023-12-13 DIAGNOSIS — I152 Hypertension secondary to endocrine disorders: Secondary | ICD-10-CM

## 2023-12-14 ENCOUNTER — Other Ambulatory Visit (HOSPITAL_COMMUNITY): Payer: Self-pay

## 2023-12-14 ENCOUNTER — Other Ambulatory Visit: Payer: Self-pay | Admitting: Family Medicine

## 2023-12-14 MED ORDER — METOPROLOL SUCCINATE ER 25 MG PO TB24
25.0000 mg | ORAL_TABLET | Freq: Every day | ORAL | 0 refills | Status: AC
  Filled 2023-12-14: qty 90, 90d supply, fill #0

## 2023-12-14 MED ORDER — ENALAPRIL MALEATE 2.5 MG PO TABS
2.5000 mg | ORAL_TABLET | Freq: Every day | ORAL | 0 refills | Status: AC
  Filled 2023-12-14: qty 90, 90d supply, fill #0

## 2023-12-14 MED ORDER — ATORVASTATIN CALCIUM 40 MG PO TABS
40.0000 mg | ORAL_TABLET | Freq: Every day | ORAL | 0 refills | Status: AC
  Filled 2023-12-14 (×2): qty 90, 90d supply, fill #0

## 2023-12-14 MED ORDER — FREESTYLE LIBRE 3 PLUS SENSOR MISC
1 refills | Status: AC
  Filled 2023-12-14 – 2023-12-20 (×3): qty 6, 90d supply, fill #0

## 2023-12-16 ENCOUNTER — Other Ambulatory Visit (HOSPITAL_COMMUNITY): Payer: Self-pay

## 2023-12-17 ENCOUNTER — Encounter: Payer: Self-pay | Admitting: Dietician

## 2023-12-17 ENCOUNTER — Encounter: Attending: Family Medicine | Admitting: Dietician

## 2023-12-17 VITALS — Wt 207.0 lb

## 2023-12-17 DIAGNOSIS — E118 Type 2 diabetes mellitus with unspecified complications: Secondary | ICD-10-CM | POA: Insufficient documentation

## 2023-12-17 NOTE — Progress Notes (Signed)
 Diabetes Self-Management Education  Visit Type: Follow-up  Appt. Start Time: 1018 Appt. End Time: 1055  12/17/2023  [name], identified by name and date of birth, is a 68 y.o. male with a diagnosis of Diabetes: type 2 .   ASSESSMENT  History includes: type 2 diabetes, HTN, MI.  Labs noted: A1c 09/28/23 6.7% Medications include: metformin , insulin  glargine (15 units) Supplements: MVI   Time in Range x 30 days: Time in range (70-180 mg/dL): 87 % (Goal >29%) Time High (181-250 mg/dL) 12 % (Goal < 74%) Time Very High (>250 mg/dl) 1 % (Goal < 5%) Time Low (54-69 mg/dL): 0 % (Goal is <5%) Time Very Low (<54) 0%  (Goal <1%)  Current blood glucose 128mg /dL.     Pt reports his colonoscopy went well.   Pt states his knee is doing better since surgery and he has been doing his walking for one hour if the weather is nice, but if it is too cold he will do his stationary bike for 45 minutes.   Pt reports he has been eating 3 meals per day, typically only snacking if blood glucose goes low. Pt reports he has been being more mindful at lunch to eat less rice and more vegetables.   Assessment and Continuation of all Previous Goals:    Goal: Have a bedtime snack containing carb + protein to prevent hypoglycemia during the night (peanut butter on crackers, cheese on crackers). - goal in progress, continue!   Goal: reduce rice serving at lunch. - goal met, continue!   Goal: continue your daily walking. - good job! Goal met! Continue with walking or stationary bike.    Goal: aim to make 1/2 of your plate vegetables at least 2x/day. - goal in progress, continue.    Goal: start eating breakfast and include at least a protein and complex carbohydrate. - good job!  Weight 207 lb (93.9 kg). Body mass index is 31.94 kg/m.   Diabetes Self-Management Education - 12/17/23 1017       Visit Information   Visit Type Follow-up      Health Coping   How would you rate your overall  health? Good      Psychosocial Assessment   Patient Belief/Attitude about Diabetes Motivated to manage diabetes    What is the hardest part about your diabetes right now, causing you the most concern, or is the most worrisome to you about your diabetes?   Making healty food and beverage choices    Self-care barriers None    Self-management support Doctor's office    Other persons present Patient    Patient Concerns Nutrition/Meal planning    Special Needs None    Preferred Learning Style No preference indicated    Learning Readiness Ready      Pre-Education Assessment   Patient understands the diabetes disease and treatment process. Needs Review    Patient understands incorporating nutritional management into lifestyle. Needs Review    Patient undertands incorporating physical activity into lifestyle. Needs Review    Patient understands using medications safely. Needs Review    Patient understands monitoring blood glucose, interpreting and using results Needs Review    Patient understands prevention, detection, and treatment of acute complications. Needs Review    Patient understands prevention, detection, and treatment of chronic complications. Needs Review    Patient understands how to develop strategies to address psychosocial issues. Needs Review    Patient understands how to develop strategies to promote health/change behavior. Needs Review  Complications   Last HgB A1C per patient/outside source 6.7 %    How often do you check your blood sugar? > 4 times/day    Fasting Blood glucose range (mg/dL) 29-870    Postprandial Blood glucose range (mg/dL) 869-820      Dietary Intake   Breakfast none    Snack (morning) none    Lunch grilled fish, salad, rice    Snack (afternoon) none    Dinner bojangles strips, beans, green beans    Snack (evening) butter cookies    Beverage(s) water, coffee      Activity / Exercise   Activity / Exercise Type Light (walking / raking leaves)     How many days per week do you exercise? 6    How many minutes per day do you exercise? 45    Total minutes per week of exercise 270      Patient Education   Previous Diabetes Education Yes    Disease Pathophysiology Explored patient's options for treatment of their diabetes    Healthy Eating Role of diet in the treatment of diabetes and the relationship between the three main macronutrients and blood glucose level;Plate Method;Reviewed blood glucose goals for pre and post meals and how to evaluate the patients' food intake on their blood glucose level.;Meal timing in regards to the patients' current diabetes medication.;Meal options for control of blood glucose level and chronic complications.    Being Active Helped patient identify appropriate exercises in relation to his/her diabetes, diabetes complications and other health issue.;Role of exercise on diabetes management, blood pressure control and cardiac health.    Medications Reviewed patients medication for diabetes, action, purpose, timing of dose and side effects.    Monitoring Identified appropriate SMBG and/or A1C goals.;Taught/evaluated CGM (comment)    Acute complications Taught prevention, symptoms, and  treatment of hypoglycemia - the 15 rule.;Discussed and identified patients' prevention, symptoms, and treatment of hyperglycemia.    Chronic complications Relationship between chronic complications and blood glucose control;Identified and discussed with patient  current chronic complications    Diabetes Stress and Support Identified and addressed patients feelings and concerns about diabetes;Worked with patient to identify barriers to care and solutions;Role of stress on diabetes    Lifestyle and Health Coping Lifestyle issues that need to be addressed for better diabetes care      Individualized Goals (developed by patient)   Nutrition General guidelines for healthy choices and portions discussed    Physical Activity Exercise 5-7  days per week;30 minutes per day    Medications take my medication as prescribed    Monitoring  Test my blood glucose as discussed    Problem Solving Eating Pattern    Reducing Risk examine blood glucose patterns;do foot checks daily;treat hypoglycemia with 15 grams of carbs if blood glucose less than 70mg /dL    Health Coping Ask for help with psychological, social, or emotional issues      Patient Self-Evaluation of Goals - Patient rates self as meeting previously set goals (% of time)   Nutrition >75% (most of the time)    Physical Activity >75% (most of the time)    Medications >75% (most of the time)    Monitoring >75% (most of the time)    Problem Solving and behavior change strategies  50 - 75 % (half of the time)    Reducing Risk (treating acute and chronic complications) 50 - 75 % (half of the time)    Health Coping 50 - 75 % (half  of the time)      Post-Education Assessment   Patient understands the diabetes disease and treatment process. Comprehends key points    Patient understands incorporating nutritional management into lifestyle. Comprehends key points    Patient undertands incorporating physical activity into lifestyle. Demonstrates understanding / competency    Patient understands using medications safely. Demonstrates understanding / competency    Patient understands monitoring blood glucose, interpreting and using results Demonstrates understanding / competency    Patient understands prevention, detection, and treatment of acute complications. Comprehends key points    Patient understands prevention, detection, and treatment of chronic complications. Comprehends key points    Patient understands how to develop strategies to address psychosocial issues. Comprehends key points    Patient understands how to develop strategies to promote health/change behavior. Comprehends key points      Outcomes   Expected Outcomes Demonstrated interest in learning. Expect positive  outcomes    Future DMSE 3-4 months    Program Status Not Completed      Subsequent Visit   Since your last visit have you continued or begun to take your medications as prescribed? Yes    Since your last visit, are you checking your blood glucose at least once a day? Yes          Individualized Plan for Diabetes Self-Management Training:   Learning Objective:  Patient will have a greater understanding of diabetes self-management. Patient education plan is to attend individual and/or group sessions per assessed needs and concerns.   Plan:   There are no Patient Instructions on file for this visit.  Expected Outcomes:  Demonstrated interest in learning. Expect positive outcomes  Education material provided: none at this visit   If problems or questions, patient to contact team via:  Phone  Future DSME appointment: 3-4 months

## 2023-12-20 ENCOUNTER — Other Ambulatory Visit (HOSPITAL_COMMUNITY): Payer: Self-pay

## 2023-12-28 ENCOUNTER — Ambulatory Visit: Payer: Self-pay

## 2024-01-25 ENCOUNTER — Ambulatory Visit: Admitting: *Deleted

## 2024-01-25 VITALS — Ht 67.5 in | Wt 206.2 lb

## 2024-01-25 DIAGNOSIS — Z0000 Encounter for general adult medical examination without abnormal findings: Secondary | ICD-10-CM

## 2024-01-25 NOTE — Progress Notes (Signed)
 "  Chief Complaint  Patient presents with   Medicare Wellness     Subjective:   Jerome Rodriguez is a 69 y.o. male who presents for a Medicare Annual Wellness Visit.  No voiced or noted concerns at this time Patient advised to keep follow-up appointment with PCP (04-05-2024)    Visit info / Clinical Intake: Medicare Wellness Visit Type:: Subsequent Annual Wellness Visit Persons participating in visit and providing information:: patient Medicare Wellness Visit Mode:: In-person (required for WTM) Interpreter Needed?: No Pre-visit prep was completed: no AWV questionnaire completed by patient prior to visit?: no Living arrangements:: with family/others Patient's Overall Health Status Rating: good Typical amount of pain: none Does pain affect daily life?: no Are you currently prescribed opioids?: no  Dietary Habits and Nutritional Risks How many meals a day?: 4 Eats fruit and vegetables daily?: yes Most meals are obtained by: preparing own meals; eating out In the last 2 weeks, have you had any of the following?: none Diabetic:: (!) yes Any non-healing wounds?: no How often do you check your BS?: continuous glucose monitor Would you like to be referred to a Nutritionist or for Diabetic Management? : no  Functional Status Activities of Daily Living (to include ambulation/medication): Independent Ambulation: Independent Medication Administration: Independent Home Management (perform basic housework or laundry): Independent Manage your own finances?: yes Primary transportation is: driving Concerns about vision?: no *vision screening is required for WTM* Concerns about hearing?: no  Fall Screening Falls in the past year?: 0 Number of falls in past year: 0 Was there an injury with Fall?: 0 Fall Risk Category Calculator: 0 Patient Fall Risk Level: Low Fall Risk  Fall Risk Patient at Risk for Falls Due to: No Fall Risks Fall risk Follow up: Falls evaluation completed;  Education provided; Falls prevention discussed  Home and Transportation Safety: All rugs have non-skid backing?: yes All stairs or steps have railings?: yes Grab bars in the bathtub or shower?: (!) no Have non-skid surface in bathtub or shower?: yes Good home lighting?: yes Regular seat belt use?: yes Hospital stays in the last year:: no  Cognitive Assessment Difficulty concentrating, remembering, or making decisions? : no Will 6CIT or Mini Cog be Completed: yes What year is it?: 0 points What month is it?: 0 points Give patient an address phrase to remember (5 components): Its very sunny outside today in January About what time is it?: 0 points Count backwards from 20 to 1: 0 points Say the months of the year in reverse: 0 points Repeat the address phrase from earlier: 0 points 6 CIT Score: 0 points  Advance Directives (For Healthcare) Does Patient Have a Medical Advance Directive?: No Would patient like information on creating a medical advance directive?: No - Patient declined  Reviewed/Updated  Reviewed/Updated: Reviewed All (Medical, Surgical, Family, Medications, Allergies, Care Teams, Patient Goals); Family History; Surgical History; Allergies; Medications; Care Teams; Patient Goals; Medical History    Allergies (verified) Lactose intolerance (gi) and Prasugrel    Current Medications (verified) Outpatient Encounter Medications as of 01/25/2024  Medication Sig   aspirin  81 MG EC tablet Take 1 tablet (81 mg total) by mouth daily.   atorvastatin  (LIPITOR) 40 MG tablet Take 1 tablet (40 mg total) by mouth daily.   clopidogrel  (PLAVIX ) 75 MG tablet Take 1 tablet (75 mg total) by mouth daily.   Continuous Glucose Sensor (FREESTYLE LIBRE 3 PLUS SENSOR) MISC Change sensor every 15 days.   Continuous Glucose Sensor (FREESTYLE LIBRE 3 SENSOR) MISC Place 1  sensor on the skin every 14 days. Use to check glucose continuously   enalapril  (VASOTEC ) 2.5 MG tablet Take 1 tablet (2.5 mg  total) by mouth daily.   Insulin  Glargine-Lixisenatide  (SOLIQUA ) 100-33 UNT-MCG/ML SOPN Inject 15 Units into the skin daily.   Insulin  Pen Needle (PEN NEEDLES) 32G X 6 MM MISC Use daily as directed.   loratadine  (CLARITIN ) 10 MG tablet Take 20 mg by mouth daily.   metFORMIN  (GLUCOPHAGE -XR) 750 MG 24 hr tablet Take 1 tablet (750 mg total) by mouth in the morning and at bedtime.   metoprolol  succinate (TOPROL -XL) 25 MG 24 hr tablet Take 1 tablet (25 mg total) by mouth daily.   Multiple Vitamin (MULTIVITAMIN) tablet Take 1 tablet by mouth daily.   nitroGLYCERIN  (NITROSTAT ) 0.4 MG SL tablet Place 1 tablet (0.4 mg total) under the tongue every 5 (five) minutes x 3 doses as needed for chest pain.   No facility-administered encounter medications on file as of 01/25/2024.    History: Past Medical History:  Diagnosis Date   Abnormal chest CT    a. 07/2012: scattered bilat noncalcified pulm nodules ranging in size from a few mm to a max of 8mm in LLL - rec f/u in 3-6 mos.   Allergy    CAD (coronary artery disease)    a. NSTEMI 10/04 => LHC: mLAD 99%=> Taxus DES to mLAD and Taxus DES to dLAD;  b. ETT-Myoview  11/2011: normal, EF 73%, no ischemia; c. 07/2012 Cath/PCI: LM <10, LAD 90 ISRp/99 ISRd (3.0x38 Promus DES), LCX 26m, 30-40d, RCA 50-60p, 30d, EF 55-65%.   Diabetes mellitus    Dyslipidemia    ED (erectile dysfunction)    History of kidney stones    HTN (hypertension)    Hyperlipidemia    Kidney stone    Myocardial infarction (HCC)    2003, 2013   Obesity    Smoker    CIGARS   Past Surgical History:  Procedure Laterality Date   APPENDECTOMY  1976   arthroscopic knee surgery Right    COLONOSCOPY     2012   CORONARY STENT PLACEMENT     CYSTOSCOPY/URETEROSCOPY/HOLMIUM LASER/STENT PLACEMENT Left 08/04/2019   Procedure: CYSTOSCOPY/URETEROSCOPY/HOLMIUM LASER/STENT PLACEMENT;  Surgeon: Francisca Redell BROCKS, MD;  Location: [facility] ORS;  Service: Urology;  Laterality: Left;   KNEE SURGERY Left     LEFT HEART CATHETERIZATION WITH CORONARY ANGIOGRAM N/A 07/11/2012   Procedure: LEFT HEART CATHETERIZATION WITH CORONARY ANGIOGRAM;  Surgeon: Peter M Jordan, MD;  Location: [facility] CATH LAB;  Service: Cardiovascular;  Laterality: N/A;   right shoulder surgery     Family History  Problem Relation Age of Onset   Diabetes Mother    Heart attack Mother        died in sleep   Diabetic kidney disease Mother    Diabetes Father    Diabetic kidney disease Father    Heart attack Father    Heart failure Father    Stroke Father    Heart disease Sister    Diabetes Sister    ALS Sister    Diabetes Sister    Diabetes Sister    Diabetes Brother    Diabetes Brother    Colon cancer Neg Hx    Colon polyps Neg Hx    Esophageal cancer Neg Hx    Pancreatic cancer Neg Hx    Stomach cancer Neg Hx    Social History   Occupational History   Occupation: taxi driver  Tobacco Use   Smoking status: Light  Smoker    Types: Cigars    Passive exposure: Current   Smokeless tobacco: Never   Tobacco comments:    smoke 1--d for 25 years; quit 6 months ago. is now smoking ciagrs occasionally   Vaping Use   Vaping status: Never Used  Substance and Sexual Activity   Alcohol use: Yes    Comment: rare    Drug use: No   Sexual activity: Yes    Birth control/protection: None   Tobacco Counseling Ready to quit: Not Answered Counseling given: Not Answered Tobacco comments: smoke 1--d for 25 years; quit 6 months ago. is now smoking ciagrs occasionally   SDOH Screenings   Food Insecurity: No Food Insecurity (01/25/2024)  Housing: Low Risk (05/28/2022)  Transportation Needs: No Transportation Needs (01/25/2024)  Depression (PHQ2-9): Low Risk (01/25/2024)  Financial Resource Strain: Low Risk (03/21/2021)  Physical Activity: Sufficiently Active (01/25/2024)  Social Connections: Moderately Isolated (01/25/2024)  Stress: No Stress Concern Present (01/25/2024)  Tobacco Use: High Risk (01/25/2024)  Health Literacy:  Adequate Health Literacy (01/25/2024)   See flowsheets for full screening details  Depression Screen PHQ 2 & 9 Depression Scale- Over the past 2 weeks, how often have you been bothered by any of the following problems? Little interest or pleasure in doing things: 0 Feeling down, depressed, or hopeless (PHQ Adolescent also includes...irritable): 0 PHQ-2 Total Score: 0 Trouble falling or staying asleep, or sleeping too much: 0 Feeling tired or having little energy: 0 Poor appetite or overeating (PHQ Adolescent also includes...weight loss): 0 Feeling bad about yourself - or that you are a failure or have let yourself or your family down: 0 Trouble concentrating on things, such as reading the newspaper or watching television (PHQ Adolescent also includes...like school work): 0 Moving or speaking so slowly that other people could have noticed. Or the opposite - being so fidgety or restless that you have been moving around a lot more than usual: 0 Thoughts that you would be better off dead, or of hurting yourself in some way: 0 PHQ-9 Total Score: 0 If you checked off any problems, how difficult have these problems made it for you to do your work, take care of things at home, or get along with other people?: Not difficult at all     Goals Addressed   None          Objective:    There were no vitals filed for this visit. There is no height or weight on file to calculate BMI.  Hearing/Vision screen Hearing Screening - Comments:: No trouble hearing Vision Screening - Comments:: [place] Up to date  Immunizations and Health Maintenance Health Maintenance  Topic Date Due   Influenza Vaccine  08/06/2023   COVID-19 Vaccine (7 - 2025-26 season) 09/06/2023   Diabetic kidney evaluation - eGFR measurement  01/28/2024   Diabetic kidney evaluation - Urine ACR  01/28/2024   FOOT EXAM  01/28/2024   HEMOGLOBIN A1C  03/27/2024   OPHTHALMOLOGY EXAM  08/22/2024   Medicare Annual Wellness (AWV)   01/24/2025   DTaP/Tdap/Td (3 - Td or Tdap) 09/13/2029   Colonoscopy  09/03/2030   Hepatitis C Screening  Completed   Zoster Vaccines- Shingrix  Completed   Meningococcal B Vaccine  Aged Out   Pneumococcal Vaccine: 50+ Years  Discontinued   Fecal DNA (Cologuard)  Discontinued        Assessment/Plan:  This is a routine wellness examination for [place].  Patient Care Team: Joyce Norleen BROCKS, MD as PCP - General (  Family Medicine) Wonda Sharper, MD as PCP - Cardiology (Cardiology) Lelon Glendia ONEIDA DEVONNA as Physician Assistant (Cardiology) Lionell Jon DEL, [facility] (Pharmacist)  I have personally reviewed and noted the following in the patients chart:   Medical and social history Use of alcohol, tobacco or illicit drugs  Current medications and supplements including opioid prescriptions. Functional ability and status Nutritional status Physical activity Advanced directives List of other physicians Hospitalizations, surgeries, and ER visits in previous 12 months Vitals Screenings to include cognitive, depression, and falls Referrals and appointments  No orders of the defined types were placed in this encounter.  In addition, I have reviewed and discussed with patient certain preventive protocols, quality metrics, and best practice recommendations. A written personalized care plan for preventive services as well as general preventive health recommendations were provided to patient.   Mliss Graff, LPN   8/79/7973   Return in 1 year (on 01/24/2025).  After Visit Summary: (MyChart) Due to this being a telephonic visit, the after visit summary with patients personalized plan was offered to patient via MyChart   Nurse Notes:  "

## 2024-01-25 NOTE — Patient Instructions (Signed)
 [name] , Thank you for taking time to come for your Medicare Wellness Visit. I appreciate your ongoing commitment to your health goals. Please review the following plan we discussed and let me know if I can assist you in the future.   Screening recommendations/referrals: Colonoscopy:  Recommended yearly ophthalmology/optometry visit for glaucoma screening and checkup Recommended yearly dental visit for hygiene and checkup  Vaccinations: Influenza vaccine:  Pneumococcal vaccine:  Tdap vaccine:  Shingles vaccine:         Preventive Care 65 Years and Older, Male Preventive care refers to lifestyle choices and visits with your health care provider that can promote health and wellness. What does preventive care include? A yearly physical exam. This is also called an annual well check. Dental exams once or twice a year. Routine eye exams. Ask your health care provider how often you should have your eyes checked. Personal lifestyle choices, including: Daily care of your teeth and gums. Regular physical activity. Eating a healthy diet. Avoiding tobacco and drug use. Limiting alcohol use. Practicing safe sex. Taking low doses of aspirin  every day. Taking vitamin and mineral supplements as recommended by your health care provider. What happens during an annual well check? The services and screenings done by your health care provider during your annual well check will depend on your age, overall health, lifestyle risk factors, and family history of disease. Counseling  Your health care provider may ask you questions about your: Alcohol use. Tobacco use. Drug use. Emotional well-being. Home and relationship well-being. Sexual activity. Eating habits. History of falls. Memory and ability to understand (cognition). Work and work astronomer. Screening  You may have the following tests or measurements: Height, weight, and BMI. Blood pressure. Lipid and cholesterol levels.  These may be checked every 5 years, or more frequently if you are over 58 years old. Skin check. Lung cancer screening. You may have this screening every year starting at age 72 if you have a 30-pack-year history of smoking and currently smoke or have quit within the past 15 years. Fecal occult blood test (FOBT) of the stool. You may have this test every year starting at age 71. Flexible sigmoidoscopy or colonoscopy. You may have a sigmoidoscopy every 5 years or a colonoscopy every 10 years starting at age 26. Prostate cancer screening. Recommendations will vary depending on your family history and other risks. Hepatitis C blood test. Hepatitis B blood test. Sexually transmitted disease (STD) testing. Diabetes screening. This is done by checking your blood sugar (glucose) after you have not eaten for a while (fasting). You may have this done every 1-3 years. Abdominal aortic aneurysm (AAA) screening. You may need this if you are a current or former smoker. Osteoporosis. You may be screened starting at age 107 if you are at high risk. Talk with your health care provider about your test results, treatment options, and if necessary, the need for more tests. Vaccines  Your health care provider may recommend certain vaccines, such as: Influenza vaccine. This is recommended every year. Tetanus, diphtheria, and acellular pertussis (Tdap, Td) vaccine. You may need a Td booster every 10 years. Zoster vaccine. You may need this after age 41. Pneumococcal 13-valent conjugate (PCV13) vaccine. One dose is recommended after age 47. Pneumococcal polysaccharide (PPSV23) vaccine. One dose is recommended after age 53. Talk to your health care provider about which screenings and vaccines you need and how often you need them. This information is not intended to replace advice given to you by your  health care provider. Make sure you discuss any questions you have with your health care provider. Document Released:  01/18/2015 Document Revised: 09/11/2015 Document Reviewed: 10/23/2014 Elsevier Interactive Patient Education  2017 [employer].  Fall Prevention in the Home Falls can cause injuries. They can happen to people of all ages. There are many things you can do to make your home safe and to help prevent falls. What can I do on the outside of my home? Regularly fix the edges of walkways and driveways and fix any cracks. Remove anything that might make you trip as you walk through a door, such as a raised step or threshold. Trim any bushes or trees on the path to your home. Use bright outdoor lighting. Clear any walking paths of anything that might make someone trip, such as rocks or tools. Regularly check to see if handrails are loose or broken. Make sure that both sides of any steps have handrails. Any raised decks and porches should have guardrails on the edges. Have any leaves, snow, or ice cleared regularly. Use sand or salt on walking paths during winter. Clean up any spills in your garage right away. This includes oil or grease spills. What can I do in the bathroom? Use night lights. Install grab bars by the toilet and in the tub and shower. Do not use towel bars as grab bars. Use non-skid mats or decals in the tub or shower. If you need to sit down in the shower, use a plastic, non-slip stool. Keep the floor dry. Clean up any water that spills on the floor as soon as it happens. Remove soap buildup in the tub or shower regularly. Attach bath mats securely with double-sided non-slip rug tape. Do not have throw rugs and other things on the floor that can make you trip. What can I do in the bedroom? Use night lights. Make sure that you have a light by your bed that is easy to reach. Do not use any sheets or blankets that are too big for your bed. They should not hang down onto the floor. Have a firm chair that has side arms. You can use this for support while you get dressed. Do not have  throw rugs and other things on the floor that can make you trip. What can I do in the kitchen? Clean up any spills right away. Avoid walking on wet floors. Keep items that you use a lot in easy-to-reach places. If you need to reach something above you, use a strong step stool that has a grab bar. Keep electrical cords out of the way. Do not use floor polish or wax that makes floors slippery. If you must use wax, use non-skid floor wax. Do not have throw rugs and other things on the floor that can make you trip. What can I do with my stairs? Do not leave any items on the stairs. Make sure that there are handrails on both sides of the stairs and use them. Fix handrails that are broken or loose. Make sure that handrails are as long as the stairways. Check any carpeting to make sure that it is firmly attached to the stairs. Fix any carpet that is loose or worn. Avoid having throw rugs at the top or bottom of the stairs. If you do have throw rugs, attach them to the floor with carpet tape. Make sure that you have a light switch at the top of the stairs and the bottom of the stairs. If you do  not have them, ask someone to add them for you. What else can I do to help prevent falls? Wear shoes that: Do not have high heels. Have rubber bottoms. Are comfortable and fit you well. Are closed at the toe. Do not wear sandals. If you use a stepladder: Make sure that it is fully opened. Do not climb a closed stepladder. Make sure that both sides of the stepladder are locked into place. Ask someone to hold it for you, if possible. Clearly mark and make sure that you can see: Any grab bars or handrails. First and last steps. Where the edge of each step is. Use tools that help you move around (mobility aids) if they are needed. These include: Canes. Walkers. Scooters. Crutches. Turn on the lights when you go into a dark area. Replace any light bulbs as soon as they burn out. Set up your furniture so  you have a clear path. Avoid moving your furniture around. If any of your floors are uneven, fix them. If there are any pets around you, be aware of where they are. Review your medicines with your doctor. Some medicines can make you feel dizzy. This can increase your chance of falling. Ask your doctor what other things that you can do to help prevent falls. This information is not intended to replace advice given to you by your health care provider. Make sure you discuss any questions you have with your health care provider. Document Released: 10/18/2008 Document Revised: 05/30/2015 Document Reviewed: 01/26/2014 Elsevier Interactive Patient Education  2017 [employer].

## 2024-02-01 ENCOUNTER — Other Ambulatory Visit: Payer: Self-pay | Admitting: Family Medicine

## 2024-02-01 ENCOUNTER — Other Ambulatory Visit: Payer: Self-pay

## 2024-02-01 ENCOUNTER — Other Ambulatory Visit (HOSPITAL_COMMUNITY): Payer: Self-pay

## 2024-02-01 DIAGNOSIS — E118 Type 2 diabetes mellitus with unspecified complications: Secondary | ICD-10-CM

## 2024-02-01 MED ORDER — METFORMIN HCL ER 750 MG PO TB24
750.0000 mg | ORAL_TABLET | Freq: Two times a day (BID) | ORAL | 0 refills | Status: AC
  Filled 2024-02-01: qty 180, 90d supply, fill #0

## 2024-03-16 ENCOUNTER — Encounter: Admitting: Dietician

## 2024-03-28 ENCOUNTER — Ambulatory Visit: Admitting: Family Medicine

## 2024-04-05 ENCOUNTER — Ambulatory Visit: Payer: Self-pay | Admitting: Family Medicine
